# Patient Record
Sex: Female | Born: 1950
Health system: Southern US, Community
[De-identification: ages and names within clinical notes are randomized; demographics above are authoritative.]

## PROBLEM LIST (undated history)

## (undated) DIAGNOSIS — E119 Type 2 diabetes mellitus without complications: Secondary | ICD-10-CM

## (undated) DIAGNOSIS — J45909 Unspecified asthma, uncomplicated: Secondary | ICD-10-CM

## (undated) DIAGNOSIS — K219 Gastro-esophageal reflux disease without esophagitis: Secondary | ICD-10-CM

## (undated) DIAGNOSIS — C4491 Basal cell carcinoma of skin, unspecified: Secondary | ICD-10-CM

## (undated) DIAGNOSIS — F419 Anxiety disorder, unspecified: Secondary | ICD-10-CM

## (undated) DIAGNOSIS — G4730 Sleep apnea, unspecified: Secondary | ICD-10-CM

## (undated) DIAGNOSIS — Z87442 Personal history of urinary calculi: Secondary | ICD-10-CM

## (undated) DIAGNOSIS — Z9889 Other specified postprocedural states: Secondary | ICD-10-CM

## (undated) DIAGNOSIS — R011 Cardiac murmur, unspecified: Secondary | ICD-10-CM

## (undated) DIAGNOSIS — R7303 Prediabetes: Secondary | ICD-10-CM

## (undated) DIAGNOSIS — M1990 Unspecified osteoarthritis, unspecified site: Secondary | ICD-10-CM

## (undated) DIAGNOSIS — I10 Essential (primary) hypertension: Secondary | ICD-10-CM

## (undated) DIAGNOSIS — I639 Cerebral infarction, unspecified: Secondary | ICD-10-CM

## (undated) DIAGNOSIS — R112 Nausea with vomiting, unspecified: Secondary | ICD-10-CM

## (undated) HISTORY — PX: DIAGNOSTIC LAPAROSCOPY: SUR761

## (undated) HISTORY — PX: CHOLECYSTECTOMY: SHX55

## (undated) HISTORY — PX: BREAST SURGERY: SHX581

## (undated) HISTORY — DX: Essential (primary) hypertension: I10

## (undated) HISTORY — DX: Type 2 diabetes mellitus without complications: E11.9

## (undated) HISTORY — DX: Cerebral infarction, unspecified: I63.9

## (undated) HISTORY — PX: ABDOMINAL HYSTERECTOMY: SHX81

## (undated) HISTORY — PX: OTHER SURGICAL HISTORY: SHX169

## (undated) HISTORY — DX: Unspecified asthma, uncomplicated: J45.909

## (undated) HISTORY — DX: Anxiety disorder, unspecified: F41.9

## (undated) HISTORY — PX: BREAST BIOPSY: SHX20

## (undated) HISTORY — PX: APPENDECTOMY: SHX54

## (undated) HISTORY — PX: BREAST EXCISIONAL BIOPSY: SUR124

---

## 1999-01-01 ENCOUNTER — Encounter: Payer: Self-pay | Admitting: Family Medicine

## 1999-01-01 ENCOUNTER — Encounter: Admission: RE | Admit: 1999-01-01 | Discharge: 1999-01-01 | Payer: Self-pay | Admitting: Family Medicine

## 1999-12-01 ENCOUNTER — Encounter: Admission: RE | Admit: 1999-12-01 | Discharge: 1999-12-01 | Payer: Self-pay | Admitting: Family Medicine

## 1999-12-01 ENCOUNTER — Encounter: Payer: Self-pay | Admitting: Family Medicine

## 1999-12-15 ENCOUNTER — Encounter: Admission: RE | Admit: 1999-12-15 | Discharge: 2000-03-14 | Payer: Self-pay | Admitting: Family Medicine

## 2000-01-07 ENCOUNTER — Encounter: Admission: RE | Admit: 2000-01-07 | Discharge: 2000-01-07 | Payer: Self-pay | Admitting: Obstetrics and Gynecology

## 2000-01-07 ENCOUNTER — Encounter: Payer: Self-pay | Admitting: Obstetrics and Gynecology

## 2000-05-24 ENCOUNTER — Encounter: Payer: Self-pay | Admitting: Family Medicine

## 2000-05-24 ENCOUNTER — Encounter: Admission: RE | Admit: 2000-05-24 | Discharge: 2000-05-24 | Payer: Self-pay | Admitting: Family Medicine

## 2001-05-16 ENCOUNTER — Encounter: Payer: Self-pay | Admitting: Emergency Medicine

## 2001-05-16 ENCOUNTER — Emergency Department (HOSPITAL_COMMUNITY): Admission: EM | Admit: 2001-05-16 | Discharge: 2001-05-16 | Payer: Self-pay | Admitting: Emergency Medicine

## 2001-08-14 ENCOUNTER — Encounter: Admission: RE | Admit: 2001-08-14 | Discharge: 2001-08-14 | Payer: Self-pay | Admitting: Family Medicine

## 2001-08-14 ENCOUNTER — Encounter: Payer: Self-pay | Admitting: Family Medicine

## 2001-10-29 ENCOUNTER — Ambulatory Visit (HOSPITAL_BASED_OUTPATIENT_CLINIC_OR_DEPARTMENT_OTHER): Admission: RE | Admit: 2001-10-29 | Discharge: 2001-10-29 | Payer: Self-pay | Admitting: Urology

## 2001-10-29 ENCOUNTER — Encounter: Payer: Self-pay | Admitting: Urology

## 2001-11-01 ENCOUNTER — Encounter: Payer: Self-pay | Admitting: Urology

## 2001-11-01 ENCOUNTER — Ambulatory Visit (HOSPITAL_BASED_OUTPATIENT_CLINIC_OR_DEPARTMENT_OTHER): Admission: RE | Admit: 2001-11-01 | Discharge: 2001-11-01 | Payer: Self-pay | Admitting: Urology

## 2001-12-11 ENCOUNTER — Other Ambulatory Visit: Admission: RE | Admit: 2001-12-11 | Discharge: 2001-12-11 | Payer: Self-pay | Admitting: Obstetrics and Gynecology

## 2002-09-23 ENCOUNTER — Encounter: Admission: RE | Admit: 2002-09-23 | Discharge: 2002-09-23 | Payer: Self-pay | Admitting: Family Medicine

## 2002-09-23 ENCOUNTER — Encounter: Payer: Self-pay | Admitting: Family Medicine

## 2004-01-21 ENCOUNTER — Ambulatory Visit (HOSPITAL_COMMUNITY): Admission: RE | Admit: 2004-01-21 | Discharge: 2004-01-21 | Payer: Self-pay | Admitting: Family Medicine

## 2004-05-25 ENCOUNTER — Encounter: Admission: RE | Admit: 2004-05-25 | Discharge: 2004-05-25 | Payer: Self-pay | Admitting: *Deleted

## 2005-02-04 ENCOUNTER — Emergency Department (HOSPITAL_COMMUNITY): Admission: EM | Admit: 2005-02-04 | Discharge: 2005-02-04 | Payer: Self-pay | Admitting: Emergency Medicine

## 2005-06-14 ENCOUNTER — Encounter: Admission: RE | Admit: 2005-06-14 | Discharge: 2005-06-14 | Payer: Self-pay | Admitting: Obstetrics and Gynecology

## 2005-09-19 ENCOUNTER — Ambulatory Visit (HOSPITAL_COMMUNITY): Admission: RE | Admit: 2005-09-19 | Discharge: 2005-09-19 | Payer: Self-pay | Admitting: Urology

## 2006-06-19 ENCOUNTER — Encounter: Admission: RE | Admit: 2006-06-19 | Discharge: 2006-06-19 | Payer: Self-pay | Admitting: Family Medicine

## 2006-08-02 ENCOUNTER — Encounter: Admission: RE | Admit: 2006-08-02 | Discharge: 2006-08-02 | Payer: Self-pay | Admitting: Obstetrics and Gynecology

## 2007-03-05 ENCOUNTER — Ambulatory Visit (HOSPITAL_COMMUNITY): Admission: RE | Admit: 2007-03-05 | Discharge: 2007-03-05 | Payer: Self-pay | Admitting: Urology

## 2007-08-07 ENCOUNTER — Encounter: Admission: RE | Admit: 2007-08-07 | Discharge: 2007-08-07 | Payer: Self-pay | Admitting: Obstetrics and Gynecology

## 2007-08-13 ENCOUNTER — Encounter: Admission: RE | Admit: 2007-08-13 | Discharge: 2007-08-13 | Payer: Self-pay | Admitting: Obstetrics and Gynecology

## 2008-07-18 ENCOUNTER — Encounter: Admission: RE | Admit: 2008-07-18 | Discharge: 2008-07-18 | Payer: Self-pay | Admitting: Family Medicine

## 2008-08-14 ENCOUNTER — Encounter: Admission: RE | Admit: 2008-08-14 | Discharge: 2008-08-14 | Payer: Self-pay | Admitting: Family Medicine

## 2008-12-09 ENCOUNTER — Encounter: Admission: RE | Admit: 2008-12-09 | Discharge: 2008-12-09 | Payer: Self-pay | Admitting: Obstetrics and Gynecology

## 2008-12-14 ENCOUNTER — Encounter: Admission: RE | Admit: 2008-12-14 | Discharge: 2008-12-14 | Payer: Self-pay | Admitting: Orthopedic Surgery

## 2009-02-09 ENCOUNTER — Ambulatory Visit (HOSPITAL_COMMUNITY): Admission: RE | Admit: 2009-02-09 | Discharge: 2009-02-09 | Payer: Self-pay | Admitting: Urology

## 2009-08-17 ENCOUNTER — Encounter: Admission: RE | Admit: 2009-08-17 | Discharge: 2009-08-17 | Payer: Self-pay | Admitting: Family Medicine

## 2010-07-30 NOTE — Op Note (Signed)
Christine Navarro, Christine Navarro NO.:  [id number]   MEDICAL RECORD NO.:  [id number]                    PATIENT TYPE:   LOCATION:                                       FACILITY:   PHYSICIAN:  Rozanna Boer., M.D.      DATE OF BIRTH:   DATE OF PROCEDURE:  10/29/2001  DATE OF DISCHARGE:                                 OPERATIVE REPORT   PREOPERATIVE DIAGNOSIS:  Right distal ureteral stone.   POSTOPERATIVE DIAGNOSIS:  Right distal ureteral stone.   OPERATION:  Right ureteroscopic stone extraction with insertion of stent.   ANESTHESIA:  General.   SURGEON:  Courtney Paris, M.D.   BRIEF HISTORY:  This 60 year old white female presented with about a month's  history of pain off and on of the right flank.  A KUB showed a large 10 mm  stone in the right kidney and what looked to be a phlebolith in the distal  ureter but when her subsequent films came back from 1973, it was clear that  this was not a phlebolith but rather a distal stone.  So she was rescheduled  from lithotripsy to ureteroscopy with stone extraction.  The patient enters  understanding the risks of the procedure, including, but not limited to  ureteral perforation, injury, bleeding, pain and spasms with stent, etc.   DESCRIPTION OF PROCEDURE:  The patient was placed on the operating table in  dorsal lithotomy position.  After satisfactory induction of general  endotracheal anesthesia, was prepped and draped with Betadine in usual  sterile fashion.  She was given IV antibiotics.  The #21 panendoscope was  inserted, and the right orifice was somewhat tight and had to be  catheterized with a floppy tip guidewire through the middle of an open-ended  ureteral stent.  This allowed the catheter to be inserted into the ureter,  and a retrograde demonstrated this was indeed a stone in the ureter with  some mild obstruction.  The guidewire was then passed under fluoroscopy past  the stone,  and the distal ureter was balloon dilated with 4 cm balloon up to  12 atmospheres, kept maintained for five times minutes.  The #6 short  ureteroscope was then passed, but apparently the stone moved proximally up  to the kidney.  I was able to entrap it with a basket and removed it intact.  Over the stent, then a 24 cm 6 French stent was then passed under  fluoroscopy with good position, and the bladder was drained.  She given a  B&O suppository and taken to the recovery room in good condition and will be  later scheduled for lithotripsy for the large right renal stone now that the  distal stone has been removed.  Rozanna Boer., M.D.    HMK/MEDQ  D:  10/29/2001  T:  10/29/2001  Job:  [id number]

## 2010-09-22 ENCOUNTER — Other Ambulatory Visit: Payer: Self-pay | Admitting: Family Medicine

## 2010-09-22 DIAGNOSIS — Z1231 Encounter for screening mammogram for malignant neoplasm of breast: Secondary | ICD-10-CM

## 2010-09-27 ENCOUNTER — Other Ambulatory Visit: Payer: Self-pay | Admitting: Family Medicine

## 2010-09-27 ENCOUNTER — Ambulatory Visit
Admission: RE | Admit: 2010-09-27 | Discharge: 2010-09-27 | Disposition: A | Discharge status: Home / Self Care | Payer: BC Managed Care – PPO | Source: Ambulatory Visit | Attending: Family Medicine | Admitting: Family Medicine

## 2010-09-27 DIAGNOSIS — Z1231 Encounter for screening mammogram for malignant neoplasm of breast: Secondary | ICD-10-CM

## 2010-09-27 DIAGNOSIS — N63 Unspecified lump in unspecified breast: Secondary | ICD-10-CM

## 2010-10-05 ENCOUNTER — Ambulatory Visit
Admission: RE | Admit: 2010-10-05 | Discharge: 2010-10-05 | Disposition: A | Discharge status: Home / Self Care | Payer: BC Managed Care – PPO | Source: Ambulatory Visit | Attending: Family Medicine | Admitting: Family Medicine

## 2010-10-05 ENCOUNTER — Other Ambulatory Visit: Payer: Self-pay | Admitting: Family Medicine

## 2010-10-05 DIAGNOSIS — N63 Unspecified lump in unspecified breast: Secondary | ICD-10-CM

## 2010-12-17 LAB — URINALYSIS, ROUTINE W REFLEX MICROSCOPIC
Bilirubin Urine: NEGATIVE
Hgb urine dipstick: NEGATIVE
Ketones, ur: NEGATIVE
Nitrite: NEGATIVE
Protein, ur: NEGATIVE
Specific Gravity, Urine: 1.022
pH: 5.5

## 2010-12-17 LAB — BASIC METABOLIC PANEL
BUN: 7
Calcium: 9.4
GFR calc Af Amer: >60
GFR calc non Af Amer: >60
Potassium: 4.5
Sodium: 140

## 2010-12-17 LAB — CBC
MCHC: 34.3
MCV: 85.3
Platelets: 243
RBC: 4.53

## 2011-10-26 ENCOUNTER — Other Ambulatory Visit: Payer: Self-pay | Admitting: Family Medicine

## 2011-10-26 DIAGNOSIS — Z1231 Encounter for screening mammogram for malignant neoplasm of breast: Secondary | ICD-10-CM

## 2011-11-17 ENCOUNTER — Ambulatory Visit
Admission: RE | Admit: 2011-11-17 | Discharge: 2011-11-17 | Disposition: A | Discharge status: Home / Self Care | Payer: BC Managed Care – PPO | Source: Ambulatory Visit | Attending: Family Medicine | Admitting: Family Medicine

## 2011-11-17 DIAGNOSIS — Z1231 Encounter for screening mammogram for malignant neoplasm of breast: Secondary | ICD-10-CM

## 2011-11-23 ENCOUNTER — Other Ambulatory Visit: Payer: Self-pay | Admitting: Family Medicine

## 2011-11-23 DIAGNOSIS — R928 Other abnormal and inconclusive findings on diagnostic imaging of breast: Secondary | ICD-10-CM

## 2011-11-24 ENCOUNTER — Ambulatory Visit
Admission: RE | Admit: 2011-11-24 | Discharge: 2011-11-24 | Disposition: A | Discharge status: Home / Self Care | Payer: BC Managed Care – PPO | Source: Ambulatory Visit | Attending: Family Medicine | Admitting: Family Medicine

## 2011-11-24 DIAGNOSIS — R928 Other abnormal and inconclusive findings on diagnostic imaging of breast: Secondary | ICD-10-CM

## 2012-05-01 ENCOUNTER — Ambulatory Visit: Payer: BC Managed Care – PPO

## 2012-05-01 ENCOUNTER — Ambulatory Visit: Payer: BC Managed Care – PPO | Admitting: Family Medicine

## 2012-05-01 VITALS — BP 132/82 | HR 87 | Temp 97.9°F | Resp 18 | Ht 62.0 in | Wt 195.0 lb

## 2012-05-01 DIAGNOSIS — R1032 Left lower quadrant pain: Secondary | ICD-10-CM

## 2012-05-01 LAB — POCT UA - MICROSCOPIC ONLY
Casts, Ur, LPF, POC: NEGATIVE
Crystals, Ur, HPF, POC: NEGATIVE
Yeast, UA: NEGATIVE

## 2012-05-01 LAB — POCT URINALYSIS DIPSTICK: pH, UA: 5.5

## 2012-05-01 MED ORDER — CEPHALEXIN 500 MG PO CAPS: 500.0000 mg | ORAL_CAPSULE | Freq: Two times a day (BID) | ORAL | Status: DC

## 2012-05-01 NOTE — Patient Instructions (Addendum)
Thank you for coming in today. Start taking miralax daily.  Start Keflex as well.  Finish both antibiotics.  Follow up with your primary doctor.  If the symptoms persist it may be reasonable to followup with your gastroenterologist for a repeat colonoscopy. If you get worse or develop significant fever or go to the emergency room

## 2012-05-01 NOTE — Progress Notes (Addendum)
Christine Navarro is a 62 y.o. female who presents to [facility] today for left lower corner and abdominal pain starting 5 days ago. She thought this perhaps was a urinary tract infection and presented to [facility] urgent care for evaluation and management. She was started on nitrofurantoin. She notes pain continued off and on since then. She denies any change in stool blood in her stool or black tarry stool. She denies any nausea vomiting diarrhea fevers or chills. She denies any blood in her urine. She states that she does have a history of kidney stones but this is not consistent with that pain. Additionally she notes a normal colonoscopy 3 or 4 years ago.     PMH: Reviewed history of hysterectomy with salpingo-oophorectomy (for fibroids and subsequent oophorectomy for teratoma). History of laparoscopic cholecystectomy and laparoscopic appendectomy. History  Substance Use Topics  . Smoking status: Never Smoker   . Smokeless tobacco: Not on file  . Alcohol Use: No   ROS as above  Medications reviewed. Current Outpatient Prescriptions  Medication Sig Dispense Refill  . estrogens, conjugated, (PREMARIN) 0.625 MG tablet Take 0.625 mg by mouth daily. Take daily for 21 days then do not take for 7 days.      . irbesartan (AVAPRO) 150 MG tablet Take 75 mg by mouth at bedtime.      Marland Kitchen LORazepam (ATIVAN) 1 MG tablet Take 1 mg by mouth daily.      . metoprolol tartrate (LOPRESSOR) 25 MG tablet Take 25 mg by mouth 2 (two) times daily.      . nitrofurantoin (MACRODANTIN) 100 MG capsule Take 100 mg by mouth.      . rosuvastatin (CRESTOR) 40 MG tablet Take 20 mg by mouth daily.      Marland Kitchen tolterodine (DETROL LA) 4 MG 24 hr capsule Take 4 mg by mouth daily.      . cephALEXin (KEFLEX) 500 MG capsule Take 1 capsule (500 mg total) by mouth 2 (two) times daily.  14 capsule  0   No current facility-administered medications for this visit.    Exam:  BP 132/82  Pulse 87  Temp(Src) 97.9 F (36.6 C)  Resp 18  Ht 5\' 2"   (1.575 m)  Wt 195 lb (88.451 kg)  BMI 35.66 kg/m2  SpO2 99% Gen: Well NAD HEENT: EOMI,  MMM Lungs: CTABL Nl WOB Heart: RRR no MRG Abd: NABS, NT, ND, no rebound or guarding no costovertebral angle tenderness to percussion Exts: Non edematous BL  LE, warm and well perfused.   Results for orders placed in visit on 05/01/12 (from the past 72 hour(s))  POCT URINALYSIS DIPSTICK     Status: None   Collection Time    05/01/12  8:56 PM      Result Value Range   Color, UA amber     Clarity, UA clear     Glucose, UA neg     Bilirubin, UA neg     Ketones, UA trace     Spec Grav, UA >=1.030     Blood, UA neg     pH, UA 5.5     Protein, UA trace     Urobilinogen, UA 0.2     Nitrite, UA neg     Leukocytes, UA Trace    POCT UA - MICROSCOPIC ONLY     Status: None   Collection Time    05/01/12  9:10 PM      Result Value Range   WBC, Ur, HPF, POC 0-6  RBC, urine, microscopic 0-2     Bacteria, U Microscopic 1+     Mucus, UA trace     Epithelial cells, urine per micros 0-1     Crystals, Ur, HPF, POC neg     Casts, Ur, LPF, POC neg     Yeast, UA neg     Waxy Casts, UA pos     Upright one view abdominal x-ray shows moderate stool burden. Surgical clips present in the upper right quadrant  Assessment and Plan: 62 y.o. female with mild to moderate left lower quadrant abdominal pain. Unsure etiology. Possibly poorly treated urinary tract infection, versus kidney stone versus stool burden. Plan: Increase MiraLAX daily, add Keflex.  Followup with primary care provider. If not improving consider further imaging and possible repeat colonoscopy.  05/02/12 Reviewed and agree with above plan. Tle

## 2012-12-05 ENCOUNTER — Other Ambulatory Visit: Payer: Self-pay

## 2012-12-05 DIAGNOSIS — Z1231 Encounter for screening mammogram for malignant neoplasm of breast: Secondary | ICD-10-CM

## 2012-12-21 ENCOUNTER — Ambulatory Visit: Payer: BC Managed Care – PPO

## 2012-12-24 ENCOUNTER — Ambulatory Visit
Admission: RE | Admit: 2012-12-24 | Discharge: 2012-12-24 | Disposition: A | Discharge status: Home / Self Care | Payer: BC Managed Care – PPO | Source: Ambulatory Visit

## 2012-12-24 DIAGNOSIS — Z1231 Encounter for screening mammogram for malignant neoplasm of breast: Secondary | ICD-10-CM

## 2012-12-26 ENCOUNTER — Other Ambulatory Visit: Payer: Self-pay | Admitting: Family Medicine

## 2012-12-26 DIAGNOSIS — N63 Unspecified lump in unspecified breast: Secondary | ICD-10-CM

## 2013-01-11 ENCOUNTER — Other Ambulatory Visit: Payer: BC Managed Care – PPO

## 2013-06-12 ENCOUNTER — Other Ambulatory Visit: Payer: BC Managed Care – PPO

## 2013-06-19 ENCOUNTER — Ambulatory Visit
Admission: RE | Admit: 2013-06-19 | Discharge: 2013-06-19 | Disposition: A | Discharge status: Home / Self Care | Payer: BC Managed Care – PPO | Source: Ambulatory Visit | Attending: Family Medicine | Admitting: Family Medicine

## 2013-06-19 ENCOUNTER — Ambulatory Visit
Admission: RE | Admit: 2013-06-19 | Discharge: 2013-06-19 | Disposition: A | Discharge status: Home / Self Care | Payer: Self-pay | Source: Ambulatory Visit | Attending: Family Medicine | Admitting: Family Medicine

## 2013-06-19 DIAGNOSIS — N63 Unspecified lump in unspecified breast: Secondary | ICD-10-CM

## 2014-06-19 DIAGNOSIS — E119 Type 2 diabetes mellitus without complications: Secondary | ICD-10-CM | POA: Diagnosis not present

## 2014-06-19 DIAGNOSIS — E780 Pure hypercholesterolemia: Secondary | ICD-10-CM | POA: Diagnosis not present

## 2014-06-19 DIAGNOSIS — I10 Essential (primary) hypertension: Secondary | ICD-10-CM | POA: Diagnosis not present

## 2014-09-08 ENCOUNTER — Other Ambulatory Visit: Payer: Self-pay

## 2014-09-08 DIAGNOSIS — Z1231 Encounter for screening mammogram for malignant neoplasm of breast: Secondary | ICD-10-CM

## 2014-09-12 ENCOUNTER — Ambulatory Visit
Admission: RE | Admit: 2014-09-12 | Discharge: 2014-09-12 | Disposition: A | Discharge status: Home / Self Care | Payer: Medicare Other | Source: Ambulatory Visit

## 2014-09-12 DIAGNOSIS — Z1231 Encounter for screening mammogram for malignant neoplasm of breast: Secondary | ICD-10-CM | POA: Diagnosis not present

## 2014-09-16 ENCOUNTER — Other Ambulatory Visit: Payer: Self-pay | Admitting: Family Medicine

## 2014-09-16 DIAGNOSIS — R928 Other abnormal and inconclusive findings on diagnostic imaging of breast: Secondary | ICD-10-CM

## 2014-09-19 ENCOUNTER — Ambulatory Visit
Admission: RE | Admit: 2014-09-19 | Discharge: 2014-09-19 | Disposition: A | Discharge status: Home / Self Care | Payer: Medicare Other | Source: Ambulatory Visit | Attending: Family Medicine | Admitting: Family Medicine

## 2014-09-19 DIAGNOSIS — R928 Other abnormal and inconclusive findings on diagnostic imaging of breast: Secondary | ICD-10-CM

## 2014-09-19 DIAGNOSIS — N6012 Diffuse cystic mastopathy of left breast: Secondary | ICD-10-CM | POA: Diagnosis not present

## 2014-09-22 DIAGNOSIS — E119 Type 2 diabetes mellitus without complications: Secondary | ICD-10-CM | POA: Diagnosis not present

## 2014-09-26 DIAGNOSIS — H40013 Open angle with borderline findings, low risk, bilateral: Secondary | ICD-10-CM | POA: Diagnosis not present

## 2014-11-21 ENCOUNTER — Other Ambulatory Visit: Payer: Self-pay | Admitting: Urology

## 2014-11-21 ENCOUNTER — Ambulatory Visit (INDEPENDENT_AMBULATORY_CARE_PROVIDER_SITE_OTHER): Payer: Medicare Other | Admitting: Family Medicine

## 2014-11-21 VITALS — BP 150/90 | HR 77 | Temp 98.1°F | Resp 18 | Ht 62.0 in | Wt 205.2 lb

## 2014-11-21 DIAGNOSIS — R109 Unspecified abdominal pain: Secondary | ICD-10-CM

## 2014-11-21 DIAGNOSIS — Z87442 Personal history of urinary calculi: Secondary | ICD-10-CM | POA: Diagnosis not present

## 2014-11-21 DIAGNOSIS — N132 Hydronephrosis with renal and ureteral calculous obstruction: Secondary | ICD-10-CM | POA: Diagnosis not present

## 2014-11-21 DIAGNOSIS — N200 Calculus of kidney: Secondary | ICD-10-CM | POA: Diagnosis not present

## 2014-11-21 DIAGNOSIS — R34 Anuria and oliguria: Secondary | ICD-10-CM | POA: Diagnosis not present

## 2014-11-21 LAB — POCT UA - MICROSCOPIC ONLY
CASTS, UR, LPF, POC: NEGATIVE
CRYSTALS, UR, HPF, POC: NEGATIVE
Epithelial cells, urine per micros: NEGATIVE
MUCUS UA: NEGATIVE
Yeast, UA: NEGATIVE

## 2014-11-21 LAB — POCT URINALYSIS DIPSTICK
BILIRUBIN UA: NEGATIVE
GLUCOSE UA: NEGATIVE
Ketones, UA: NEGATIVE
Leukocytes, UA: NEGATIVE
NITRITE UA: NEGATIVE
Protein, UA: NEGATIVE
SPEC GRAV UA: 1.02
Urobilinogen, UA: 0.2
pH, UA: 6

## 2014-11-21 MED ORDER — KETOROLAC TROMETHAMINE 30 MG/ML IJ SOLN
30.0000 mg | Freq: Once | INTRAMUSCULAR | Status: AC
  Administered 2014-11-21: 30 mg via INTRAMUSCULAR

## 2014-11-21 NOTE — Progress Notes (Addendum)
[facility] [address] [place] [address] [phone]  Date:  11/21/2014   Name:  Christine Navarro   DOB:  09/28/1950   MRN:  [id number]  PCP:  No primary care provider on file.    Chief Complaint: Nephrolithiasis   History of Present Illness:  Christine Navarro is a 64 y.o. very pleasant female patient who presents with the following:  She awoke this am around 615 with left flank pain.  She has a known history of kidney stones- this seems like a stone to her Her urologist has retired, but she will re-establish with a new MD at [facility] soon- in fact she has an appt this afternoon  She did not see any blood in her urine, but it did appear dark and cloudy She was vomited with pain today  She took tylenol only so far today  Patient Active Problem List   Diagnosis Date Noted  . Abdominal pain, left lower quadrant 05/01/2012    Past Medical History  Diagnosis Date  . Asthma   . Diabetes mellitus without complication   . Anxiety   . Hypertension     Past Surgical History  Procedure Laterality Date  . Appendectomy    . Breast surgery    . Cholecystectomy    . Abdominal hysterectomy      Social History  Substance Use Topics  . Smoking status: Never Smoker   . Smokeless tobacco: None  . Alcohol Use: No    Family History  Problem Relation Age of Onset  . Cancer Mother   . Heart disease Father     Allergies  Allergen Reactions  . Ace Inhibitors Cough  . Ciprofloxacin Other (See Comments)    Muscle Cramps  . Sular [Nisoldipine Er]   . Vitamin D Analogs Nausea And Vomiting  . Welchol [Colesevelam Hcl] Nausea And Vomiting  . Erythromycin Nausea And Vomiting  . Sulfa Antibiotics Rash    Medication list has been reviewed and updated.  Current Outpatient Prescriptions on File Prior to Visit  Medication Sig Dispense Refill  . estrogens, conjugated, (PREMARIN) 0.625 MG tablet Take 0.625 mg by mouth daily. Take daily for 21 days then do  not take for 7 days.    . irbesartan (AVAPRO) 150 MG tablet Take 75 mg by mouth at bedtime.    Marland Kitchen LORazepam (ATIVAN) 1 MG tablet Take 1 mg by mouth daily.    . metoprolol tartrate (LOPRESSOR) 25 MG tablet Take 25 mg by mouth 2 (two) times daily.    . nitrofurantoin (MACRODANTIN) 100 MG capsule Take 100 mg by mouth.    . rosuvastatin (CRESTOR) 40 MG tablet Take 20 mg by mouth daily.    Marland Kitchen tolterodine (DETROL LA) 4 MG 24 hr capsule Take 4 mg by mouth daily.     No current facility-administered medications on file prior to visit.    Review of Systems:  As per HPI- otherwise negative.   Physical Examination: Filed Vitals:   11/21/14 1037  BP: 162/92  Pulse: 77  Temp: 98.1 F (36.7 C)  Resp: 18   Filed Vitals:   11/21/14 1037  Height: 5\' 2"  (1.575 m)  Weight: 205 lb 3.2 oz (93.078 kg)   Body mass index is 37.52 kg/(m^2). Ideal Body Weight: Weight in (lb) to have BMI = 25: 136.4  GEN: WDWN, NAD, Non-toxic, A & O x 3, obese, looks well HEENT: Atraumatic, Normocephalic. Neck supple. No masses, No LAD. Ears and  Nose: No external deformity. CV: RRR, No M/G/R. No JVD. No thrill. No extra heart sounds. PULM: CTA B, no wheezes, crackles, rhonchi. No retractions. No resp. distress. No accessory muscle use. ABD: S, NT, ND. No rebound. No HSM. EXTR: No c/c/e NEURO Normal gait.  PSYCH: Normally interactive. Conversant. Not depressed or anxious appearing.  Calm demeanor.  Left CVA tenderness  Given toradol 30 mg IM once for pain- this helped hear lot Results for orders placed or performed in visit on 11/21/14  POCT UA - Microscopic Only  Result Value Ref Range   WBC, Ur, HPF, POC 0-2    RBC, urine, microscopic 7-9    Bacteria, U Microscopic trace    Mucus, UA neg    Epithelial cells, urine per micros neg    Crystals, Ur, HPF, POC neg    Casts, Ur, LPF, POC neg    Yeast, UA neg   POCT urinalysis dipstick  Result Value Ref Range   Color, UA amber    Clarity, UA turbid     Glucose, UA neg    Bilirubin, UA neg    Ketones, UA neg    Spec Grav, UA 1.020    Blood, UA mod    pH, UA 6.0    Protein, UA neg    Urobilinogen, UA 0.2    Nitrite, UA neg    Leukocytes, UA Negative Negative    Assessment and Plan: Right flank pain - Plan: ketorolac (TORADOL) 30 MG/ML injection 30 mg, POCT UA - Microscopic Only, POCT urinalysis dipstick, Urine culture  History of nephrolithiasis  Likely nephrolithiasis.  Treated with toradol for pain with good results.  She will see her urologist in about an hour for further eval  Signed Lamar Blinks, MD Called her on 9/13- she confirms that she did have a stone. Will have lithotripsy next week.  She is doing better as far as pain Mixed bacteria on ucx is likely contamination

## 2014-11-21 NOTE — Patient Instructions (Addendum)
Your urine today does suggest a kidney stone-  Please see your urologist today as planned- they may wish to do an X-ray or CT You received 30 mg of Toradol for pain today Let us know if you need anything further I sent your urine for a culture and will let you know if you grow any bacteria to indicate a UTI

## 2014-11-22 LAB — URINE CULTURE: Colony Count: >=100000

## 2014-11-23 ENCOUNTER — Encounter: Payer: Self-pay | Admitting: Family Medicine

## 2014-11-25 ENCOUNTER — Encounter (HOSPITAL_COMMUNITY): Payer: Self-pay | Admitting: *Deleted

## 2014-11-26 ENCOUNTER — Other Ambulatory Visit: Payer: Self-pay

## 2014-11-26 ENCOUNTER — Encounter (HOSPITAL_COMMUNITY)
Admission: RE | Admit: 2014-11-26 | Discharge: 2014-11-26 | Disposition: A | Discharge status: Home / Self Care | Payer: Medicare Other | Source: Ambulatory Visit | Attending: Urology | Admitting: Urology

## 2014-11-26 DIAGNOSIS — I10 Essential (primary) hypertension: Secondary | ICD-10-CM | POA: Diagnosis not present

## 2014-11-26 DIAGNOSIS — E669 Obesity, unspecified: Secondary | ICD-10-CM | POA: Diagnosis not present

## 2014-11-26 DIAGNOSIS — J45909 Unspecified asthma, uncomplicated: Secondary | ICD-10-CM | POA: Diagnosis not present

## 2014-11-26 DIAGNOSIS — N132 Hydronephrosis with renal and ureteral calculous obstruction: Secondary | ICD-10-CM | POA: Diagnosis not present

## 2014-11-26 DIAGNOSIS — R109 Unspecified abdominal pain: Secondary | ICD-10-CM | POA: Diagnosis present

## 2014-11-26 DIAGNOSIS — I499 Cardiac arrhythmia, unspecified: Secondary | ICD-10-CM | POA: Diagnosis not present

## 2014-11-26 DIAGNOSIS — E119 Type 2 diabetes mellitus without complications: Secondary | ICD-10-CM | POA: Diagnosis not present

## 2014-11-26 DIAGNOSIS — G4730 Sleep apnea, unspecified: Secondary | ICD-10-CM | POA: Diagnosis not present

## 2014-11-27 ENCOUNTER — Ambulatory Visit (HOSPITAL_COMMUNITY): Payer: Medicare Other

## 2014-11-27 ENCOUNTER — Ambulatory Visit (HOSPITAL_COMMUNITY)
Admission: RE | Admit: 2014-11-27 | Discharge: 2014-11-27 | Disposition: A | Discharge status: Home / Self Care | Payer: Medicare Other | Source: Ambulatory Visit | Attending: Urology | Admitting: Urology

## 2014-11-27 ENCOUNTER — Encounter (HOSPITAL_COMMUNITY)
Admission: RE | Disposition: A | Discharge status: Home / Self Care | Payer: Self-pay | Source: Ambulatory Visit | Attending: Urology

## 2014-11-27 ENCOUNTER — Encounter (HOSPITAL_COMMUNITY): Payer: Self-pay

## 2014-11-27 DIAGNOSIS — E119 Type 2 diabetes mellitus without complications: Secondary | ICD-10-CM | POA: Insufficient documentation

## 2014-11-27 DIAGNOSIS — N132 Hydronephrosis with renal and ureteral calculous obstruction: Secondary | ICD-10-CM | POA: Diagnosis not present

## 2014-11-27 DIAGNOSIS — J45909 Unspecified asthma, uncomplicated: Secondary | ICD-10-CM | POA: Insufficient documentation

## 2014-11-27 DIAGNOSIS — I10 Essential (primary) hypertension: Secondary | ICD-10-CM | POA: Insufficient documentation

## 2014-11-27 DIAGNOSIS — I499 Cardiac arrhythmia, unspecified: Secondary | ICD-10-CM | POA: Insufficient documentation

## 2014-11-27 DIAGNOSIS — G4730 Sleep apnea, unspecified: Secondary | ICD-10-CM | POA: Diagnosis not present

## 2014-11-27 DIAGNOSIS — N201 Calculus of ureter: Secondary | ICD-10-CM

## 2014-11-27 DIAGNOSIS — E669 Obesity, unspecified: Secondary | ICD-10-CM | POA: Insufficient documentation

## 2014-11-27 DIAGNOSIS — N200 Calculus of kidney: Secondary | ICD-10-CM | POA: Diagnosis not present

## 2014-11-27 HISTORY — DX: Other specified postprocedural states: Z98.890

## 2014-11-27 HISTORY — DX: Nausea with vomiting, unspecified: R11.2

## 2014-11-27 SURGERY — LITHOTRIPSY, ESWL
Anesthesia: LOCAL | Laterality: Left

## 2014-11-27 MED ORDER — SODIUM CHLORIDE 0.9 % IV SOLN
INTRAVENOUS | Status: DC
  Administered 2014-11-27: 11:00:00 via INTRAVENOUS

## 2014-11-27 MED ORDER — DIPHENHYDRAMINE HCL 25 MG PO CAPS
25.0000 mg | ORAL_CAPSULE | ORAL | Status: AC
  Administered 2014-11-27: 25 mg via ORAL
  Filled 2014-11-27: qty 1

## 2014-11-27 MED ORDER — DIAZEPAM 5 MG PO TABS
10.0000 mg | ORAL_TABLET | ORAL | Status: AC
  Administered 2014-11-27: 10 mg via ORAL
  Filled 2014-11-27: qty 2

## 2014-11-27 MED ORDER — CEFAZOLIN SODIUM-DEXTROSE 2-3 GM-% IV SOLR
2.0000 g | INTRAVENOUS | Status: AC
  Administered 2014-11-27: 2 g via INTRAVENOUS
  Filled 2014-11-27: qty 50

## 2014-11-27 NOTE — Discharge Instructions (Signed)
1 - You may have urinary urgency (bladder spasms), bloody urine, and pass tiny stone fragments on / off for up to several weeks. This is normal.  2 - Call MD or go to ER for fever >102, severe pain / nausea / vomiting not relieved by medications, or acute change in medical status

## 2014-11-27 NOTE — H&P (Signed)
Christine Navarro is an 64 y.o. female.    Chief Complaint: Pre-OP Left Shockwave Lithotripsy  HPI:   1 - Left Flank Pain / Proximal Ureteral Stone - 1 day fo acute colickly left flank pain w/o fevers. Some nausea with dry heaves. Had shot of toradol at urgent care and feelign better at present, "feels like Prior stones" CT 11/2014 corroborated 38mm let prox stone.   2 - Nephrolithiasis -  Pre 2016 SWLx several, URS x several 11/2014 34mm left prox ureteral stone, lateral to L2 on CT scout images with mild hydro. Also bilat renal stones, non-obstrucitng including Rt lower pole 1cm.   3 - Metabolic Stone Disease / Oliguria -  Met EVal 2013: CMP,PTH,Urate - normal; Compositoin 90% CaOx / 10% Urate; 24 Hr urines - Low volume (1L) ==> increased hydration and dietary citrate.  PMH sig for DM2, obeswity, benign hyst, open chole. Her PCP is Jeanette Caprice MD.  Today " Christine Navarro " is seen to proceed with left shockwave lithotripsy. NO interval fevers. Most recent UA without infectious parameters.   Past Medical History  Diagnosis Date  . Asthma   . Diabetes mellitus without complication   . Anxiety   . Hypertension   . PONV (postoperative nausea and vomiting)     Past Surgical History  Procedure Laterality Date  . Appendectomy    . Breast surgery    . Cholecystectomy    . Abdominal hysterectomy      Family History  Problem Relation Age of Onset  . Cancer Mother   . Heart disease Father    Social History:  reports that she has never smoked. She does not have any smokeless tobacco history on file. She reports that she does not drink alcohol or use illicit drugs.  Allergies:  Allergies  Allergen Reactions  . Ace Inhibitors Cough  . Ciprofloxacin Other (See Comments)    Muscle Cramps  . Sular [Nisoldipine Er] Other (See Comments)    HEART RACING  . Vitamin D Analogs Nausea And Vomiting  . Welchol [Colesevelam Hcl] Nausea And Vomiting  . Erythromycin Nausea And Vomiting  . Sulfa  Antibiotics Rash    No prescriptions prior to admission    No results found for this or any previous visit (from the past 48 hour(s)). No results found.  Review of Systems  Constitutional: Negative.  Negative for fever and chills.  HENT: Negative.   Eyes: Negative.   Respiratory: Negative.   Cardiovascular: Negative.   Gastrointestinal: Negative.   Genitourinary: Positive for flank pain.  Musculoskeletal: Negative.   Skin: Negative.   Neurological: Negative.   Endo/Heme/Allergies: Negative.   Psychiatric/Behavioral: Negative.     Height $Remov'5\' 2"'NbZMKc$  (1.575 m), weight 92.987 kg (205 lb). Physical Exam  Constitutional: She is oriented to person, place, and time. She appears well-developed.  HENT:  Head: Normocephalic.  Eyes: Pupils are equal, round, and reactive to light.  Neck: Normal range of motion.  Cardiovascular: Normal rate.   Respiratory: Effort normal.  GI: Soft.  Genitourinary:  Mild left CVAT  Musculoskeletal: Normal range of motion.  Neurological: She is alert and oriented to person, place, and time.  Skin: Skin is warm.  Psychiatric: She has a normal mood and affect. Her behavior is normal. Judgment and thought content normal.     Assessment/Plan  1 - Flank Pain - due to left ureteral stone as per below.   2 - Nephrolithiasis -   We rediscussed shockwave lithotripsy in detail as well as  my "rule of 9s" with stones <63m, less than 900 HU, and skin to stone distance <9cm having approximately 90% treatment success with single session of treatment. We then readdressed how stones that are larger, more dense, and in patients with less favorable anatomy have incrementally decreased success rates. We rediscussed risks including, bleeding, infection, hematoma, loss of kidney, need for staged therapy, need for adjunctive therapy and requirement to refrain from any anticoagulants, anti-platelet or aspirin-like products peri-procedureally. After careful consideration, the  patient has chosen to proceed.    3 - Metabolic Stone Disease / Oliguria - continue to rec vigorous hydration as well as citrate intake as best management to decease future risk. continue to rec vigorous hydration as well as citrate intake as best management to decease future risk.    Dierdre Mccalip 11/27/2014, 7:10 AM

## 2014-12-22 ENCOUNTER — Other Ambulatory Visit: Payer: Self-pay | Admitting: Advanced Practice Midwife

## 2014-12-22 DIAGNOSIS — I10 Essential (primary) hypertension: Secondary | ICD-10-CM | POA: Diagnosis not present

## 2014-12-22 DIAGNOSIS — E1165 Type 2 diabetes mellitus with hyperglycemia: Secondary | ICD-10-CM | POA: Diagnosis not present

## 2014-12-22 DIAGNOSIS — Z23 Encounter for immunization: Secondary | ICD-10-CM | POA: Diagnosis not present

## 2014-12-22 DIAGNOSIS — F39 Unspecified mood [affective] disorder: Secondary | ICD-10-CM | POA: Diagnosis not present

## 2014-12-22 DIAGNOSIS — R221 Localized swelling, mass and lump, neck: Secondary | ICD-10-CM | POA: Diagnosis not present

## 2014-12-22 DIAGNOSIS — E559 Vitamin D deficiency, unspecified: Secondary | ICD-10-CM | POA: Diagnosis not present

## 2014-12-22 DIAGNOSIS — J45909 Unspecified asthma, uncomplicated: Secondary | ICD-10-CM | POA: Diagnosis not present

## 2014-12-22 DIAGNOSIS — N951 Menopausal and female climacteric states: Secondary | ICD-10-CM | POA: Diagnosis not present

## 2014-12-22 DIAGNOSIS — E7800 Pure hypercholesterolemia, unspecified: Secondary | ICD-10-CM | POA: Diagnosis not present

## 2014-12-22 DIAGNOSIS — Z0000 Encounter for general adult medical examination without abnormal findings: Secondary | ICD-10-CM | POA: Diagnosis not present

## 2014-12-22 DIAGNOSIS — Z6836 Body mass index (BMI) 36.0-36.9, adult: Secondary | ICD-10-CM | POA: Diagnosis not present

## 2014-12-25 ENCOUNTER — Encounter (INDEPENDENT_AMBULATORY_CARE_PROVIDER_SITE_OTHER): Payer: Self-pay

## 2014-12-25 ENCOUNTER — Ambulatory Visit
Admission: RE | Admit: 2014-12-25 | Discharge: 2014-12-25 | Disposition: A | Discharge status: Home / Self Care | Payer: Medicare Other | Source: Ambulatory Visit | Attending: Advanced Practice Midwife | Admitting: Advanced Practice Midwife

## 2014-12-25 DIAGNOSIS — R221 Localized swelling, mass and lump, neck: Secondary | ICD-10-CM | POA: Diagnosis not present

## 2015-01-02 ENCOUNTER — Other Ambulatory Visit: Payer: Self-pay | Admitting: Family Medicine

## 2015-01-02 DIAGNOSIS — R221 Localized swelling, mass and lump, neck: Secondary | ICD-10-CM

## 2015-01-08 DIAGNOSIS — N200 Calculus of kidney: Secondary | ICD-10-CM | POA: Diagnosis not present

## 2015-01-08 DIAGNOSIS — N201 Calculus of ureter: Secondary | ICD-10-CM | POA: Diagnosis not present

## 2015-01-09 ENCOUNTER — Ambulatory Visit
Admission: RE | Admit: 2015-01-09 | Discharge: 2015-01-09 | Disposition: A | Discharge status: Home / Self Care | Payer: Medicare Other | Source: Ambulatory Visit | Attending: Family Medicine | Admitting: Family Medicine

## 2015-01-09 DIAGNOSIS — R221 Localized swelling, mass and lump, neck: Secondary | ICD-10-CM

## 2015-01-09 MED ORDER — IOPAMIDOL (ISOVUE-300) INJECTION 61%
75.0000 mL | Freq: Once | INTRAVENOUS | Status: AC | PRN
  Administered 2015-01-09: 75 mL via INTRAVENOUS

## 2015-01-29 DIAGNOSIS — M859 Disorder of bone density and structure, unspecified: Secondary | ICD-10-CM | POA: Diagnosis not present

## 2015-01-29 DIAGNOSIS — M8589 Other specified disorders of bone density and structure, multiple sites: Secondary | ICD-10-CM | POA: Diagnosis not present

## 2015-05-26 DIAGNOSIS — M25571 Pain in right ankle and joints of right foot: Secondary | ICD-10-CM | POA: Diagnosis not present

## 2015-06-25 DIAGNOSIS — Z6836 Body mass index (BMI) 36.0-36.9, adult: Secondary | ICD-10-CM | POA: Diagnosis not present

## 2015-06-25 DIAGNOSIS — E7800 Pure hypercholesterolemia, unspecified: Secondary | ICD-10-CM | POA: Diagnosis not present

## 2015-06-25 DIAGNOSIS — E119 Type 2 diabetes mellitus without complications: Secondary | ICD-10-CM | POA: Diagnosis not present

## 2015-06-25 DIAGNOSIS — I10 Essential (primary) hypertension: Secondary | ICD-10-CM | POA: Diagnosis not present

## 2015-07-14 DIAGNOSIS — H1030 Unspecified acute conjunctivitis, unspecified eye: Secondary | ICD-10-CM | POA: Diagnosis not present

## 2015-07-14 DIAGNOSIS — R300 Dysuria: Secondary | ICD-10-CM | POA: Diagnosis not present

## 2015-07-14 DIAGNOSIS — N3000 Acute cystitis without hematuria: Secondary | ICD-10-CM | POA: Diagnosis not present

## 2015-08-06 DIAGNOSIS — H40013 Open angle with borderline findings, low risk, bilateral: Secondary | ICD-10-CM | POA: Diagnosis not present

## 2015-08-11 DIAGNOSIS — R34 Anuria and oliguria: Secondary | ICD-10-CM | POA: Diagnosis not present

## 2015-08-11 DIAGNOSIS — N200 Calculus of kidney: Secondary | ICD-10-CM | POA: Diagnosis not present

## 2015-08-11 DIAGNOSIS — Z0000 Encounter for general adult medical examination without abnormal findings: Secondary | ICD-10-CM | POA: Diagnosis not present

## 2015-09-14 DIAGNOSIS — H40013 Open angle with borderline findings, low risk, bilateral: Secondary | ICD-10-CM | POA: Diagnosis not present

## 2015-09-14 DIAGNOSIS — H2513 Age-related nuclear cataract, bilateral: Secondary | ICD-10-CM | POA: Diagnosis not present

## 2015-09-14 DIAGNOSIS — H40003 Preglaucoma, unspecified, bilateral: Secondary | ICD-10-CM | POA: Diagnosis not present

## 2015-09-14 DIAGNOSIS — E119 Type 2 diabetes mellitus without complications: Secondary | ICD-10-CM | POA: Diagnosis not present

## 2015-09-23 DIAGNOSIS — D485 Neoplasm of uncertain behavior of skin: Secondary | ICD-10-CM | POA: Diagnosis not present

## 2015-09-23 DIAGNOSIS — D225 Melanocytic nevi of trunk: Secondary | ICD-10-CM | POA: Diagnosis not present

## 2015-09-23 DIAGNOSIS — C44311 Basal cell carcinoma of skin of nose: Secondary | ICD-10-CM | POA: Diagnosis not present

## 2015-10-21 DIAGNOSIS — L570 Actinic keratosis: Secondary | ICD-10-CM | POA: Diagnosis not present

## 2015-10-21 DIAGNOSIS — Z08 Encounter for follow-up examination after completed treatment for malignant neoplasm: Secondary | ICD-10-CM | POA: Diagnosis not present

## 2015-10-21 DIAGNOSIS — X32XXXD Exposure to sunlight, subsequent encounter: Secondary | ICD-10-CM | POA: Diagnosis not present

## 2015-10-21 DIAGNOSIS — Z85828 Personal history of other malignant neoplasm of skin: Secondary | ICD-10-CM | POA: Diagnosis not present

## 2015-11-30 ENCOUNTER — Other Ambulatory Visit: Payer: Self-pay | Admitting: Family Medicine

## 2015-11-30 DIAGNOSIS — E7800 Pure hypercholesterolemia, unspecified: Secondary | ICD-10-CM | POA: Diagnosis not present

## 2015-11-30 DIAGNOSIS — R0789 Other chest pain: Secondary | ICD-10-CM | POA: Diagnosis not present

## 2015-11-30 DIAGNOSIS — I10 Essential (primary) hypertension: Secondary | ICD-10-CM | POA: Diagnosis not present

## 2015-11-30 DIAGNOSIS — N644 Mastodynia: Secondary | ICD-10-CM | POA: Diagnosis not present

## 2015-11-30 DIAGNOSIS — Z23 Encounter for immunization: Secondary | ICD-10-CM | POA: Diagnosis not present

## 2015-11-30 DIAGNOSIS — E119 Type 2 diabetes mellitus without complications: Secondary | ICD-10-CM | POA: Diagnosis not present

## 2015-11-30 DIAGNOSIS — F411 Generalized anxiety disorder: Secondary | ICD-10-CM | POA: Diagnosis not present

## 2015-12-03 ENCOUNTER — Ambulatory Visit
Admission: RE | Admit: 2015-12-03 | Discharge: 2015-12-03 | Disposition: A | Discharge status: Home / Self Care | Payer: Medicare Other | Source: Ambulatory Visit | Attending: Family Medicine | Admitting: Family Medicine

## 2015-12-03 DIAGNOSIS — N644 Mastodynia: Secondary | ICD-10-CM

## 2015-12-04 DIAGNOSIS — I10 Essential (primary) hypertension: Secondary | ICD-10-CM | POA: Diagnosis not present

## 2015-12-04 DIAGNOSIS — E785 Hyperlipidemia, unspecified: Secondary | ICD-10-CM | POA: Diagnosis not present

## 2015-12-04 DIAGNOSIS — E119 Type 2 diabetes mellitus without complications: Secondary | ICD-10-CM | POA: Diagnosis not present

## 2015-12-04 DIAGNOSIS — E668 Other obesity: Secondary | ICD-10-CM | POA: Diagnosis not present

## 2015-12-04 DIAGNOSIS — R0789 Other chest pain: Secondary | ICD-10-CM | POA: Diagnosis not present

## 2015-12-14 ENCOUNTER — Other Ambulatory Visit: Payer: Self-pay | Admitting: Cardiology

## 2015-12-14 ENCOUNTER — Ambulatory Visit
Admission: RE | Admit: 2015-12-14 | Discharge: 2015-12-14 | Disposition: A | Discharge status: Home / Self Care | Payer: Medicare Other | Source: Ambulatory Visit | Attending: Cardiology | Admitting: Cardiology

## 2015-12-14 DIAGNOSIS — R079 Chest pain, unspecified: Secondary | ICD-10-CM | POA: Diagnosis not present

## 2015-12-14 DIAGNOSIS — R0602 Shortness of breath: Secondary | ICD-10-CM

## 2015-12-15 DIAGNOSIS — R079 Chest pain, unspecified: Secondary | ICD-10-CM | POA: Diagnosis not present

## 2015-12-21 ENCOUNTER — Other Ambulatory Visit: Payer: Self-pay | Admitting: Urology

## 2015-12-21 ENCOUNTER — Encounter (HOSPITAL_COMMUNITY): Payer: Self-pay | Admitting: *Deleted

## 2015-12-21 DIAGNOSIS — N200 Calculus of kidney: Secondary | ICD-10-CM | POA: Diagnosis not present

## 2015-12-21 DIAGNOSIS — N132 Hydronephrosis with renal and ureteral calculous obstruction: Secondary | ICD-10-CM | POA: Diagnosis not present

## 2015-12-21 NOTE — Progress Notes (Signed)
Need orders signed off on in EPIC.  Surgery on 12/23/15.  Thank You

## 2015-12-21 NOTE — Progress Notes (Signed)
Chest 10/17 epic, ekg with stress test 12/15/15, lov dr Wynonia Lawman 12/04/15 chart, hgba1c 11/30/15, cmp 11/30/15, lov dr Lynnda Child 9/17 all on chart

## 2015-12-21 NOTE — Patient Instructions (Addendum)
Christine Navarro  12/21/2015   Your procedure is scheduled on: 12/23/15  Report to [facility] Main  Entrance take [place]  elevators to 3rd floor to  [place] [facility] [place]  AM.  Call this number if you have problems the morning of surgery [phone]   Remember: ONLY 1 PERSON MAY GO WITH YOU TO SHORT STAY TO GET  READY MORNING OF [place].  Do not eat food or drink liquids :After Midnight.     Take these medicines the morning of surgery with A SIP OF WATER: METOPROLOL,May take Ketoralc(torodal only if you have not taken Oxycodone), lorezapam(ativan), Percocet, Promethazine(phenergran) if needed DO NOT TAKE ANY DIABETIC MEDICATIONS DAY OF YOUR SURGERY                               You may not have any metal on your body including hair pins and              piercings  Do not wear jewelry, make-up, lotions, powders or perfumes, deodorant             Do not wear nail polish.  Do not shave  48 hours prior to surgery.              Men may shave face and neck.   Do not bring valuables to the hospital. [place].  Contacts, dentures or bridgework may not be worn into surgery.  Leave suitcase in the car. After surgery it may be brought to your room.     Patients discharged the day of surgery will not be allowed to drive home.  Name and phone number of your driver:daughter  Christine Navarro  Cell [phone]  Special Instructions: N/A              Please read over the following fact sheets you were given: _____________________________________________________________________             [facility] - Preparing for Surgery Before surgery, you can play an important role.  Because skin is not sterile, your skin needs to be as free of germs as possible.  You can reduce the number of germs on your skin by washing with CHG (chlorahexidine gluconate) soap before surgery.  CHG is an antiseptic cleaner which kills germs and  bonds with the skin to continue killing germs even after washing. Please DO NOT use if you have an allergy to CHG or antibacterial soaps.  If your skin becomes reddened/irritated stop using the CHG and inform your nurse when you arrive at Short Stay. Do not shave (including legs and underarms) for at least 48 hours prior to the first CHG shower.  You may shave your face/neck. Please follow these instructions carefully:  1.  Shower with CHG Soap the night before surgery and the  morning of Surgery.  2.  If you choose to wash your hair, wash your hair first as usual with your  normal  shampoo.  3.  After you shampoo, rinse your hair and body thoroughly to remove the  shampoo.                           4.  Use CHG  as you would any other liquid soap.  You can apply chg directly  to the skin and wash                       Gently with a scrungie or clean washcloth.  5.  Apply the CHG Soap to your body ONLY FROM THE NECK DOWN.   Do not use on face/ open                           Wound or open sores. Avoid contact with eyes, ears mouth and genitals (private parts).                       Wash face,  Genitals (private parts) with your normal soap.             6.  Wash thoroughly, paying special attention to the area where your surgery  will be performed.  7.  Thoroughly rinse your body with warm water from the neck down.  8.  DO NOT shower/wash with your normal soap after using and rinsing off  the CHG Soap.                9.  Pat yourself dry with a clean towel.            10.  Wear clean pajamas.            11.  Place clean sheets on your bed the night of your first shower and do not  sleep with pets. Day of Surgery : Do not apply any lotions/deodorants the morning of surgery.  Please wear clean clothes to the hospital/surgery center.  FAILURE TO FOLLOW THESE INSTRUCTIONS MAY RESULT IN THE CANCELLATION OF YOUR SURGERY PATIENT SIGNATURE_________________________________  NURSE  SIGNATURE__________________________________  ________________________________________________________________________

## 2015-12-22 ENCOUNTER — Encounter (HOSPITAL_COMMUNITY)
Admission: RE | Admit: 2015-12-22 | Discharge: 2015-12-22 | Disposition: A | Discharge status: Home / Self Care | Payer: Medicare Other | Source: Ambulatory Visit | Attending: Urology | Admitting: Urology

## 2015-12-22 ENCOUNTER — Encounter (HOSPITAL_COMMUNITY): Payer: Self-pay

## 2015-12-22 DIAGNOSIS — J45909 Unspecified asthma, uncomplicated: Secondary | ICD-10-CM | POA: Diagnosis not present

## 2015-12-22 DIAGNOSIS — Z8249 Family history of ischemic heart disease and other diseases of the circulatory system: Secondary | ICD-10-CM | POA: Diagnosis not present

## 2015-12-22 DIAGNOSIS — Z881 Allergy status to other antibiotic agents status: Secondary | ICD-10-CM | POA: Diagnosis not present

## 2015-12-22 DIAGNOSIS — Z882 Allergy status to sulfonamides status: Secondary | ICD-10-CM | POA: Diagnosis not present

## 2015-12-22 DIAGNOSIS — E119 Type 2 diabetes mellitus without complications: Secondary | ICD-10-CM | POA: Diagnosis not present

## 2015-12-22 DIAGNOSIS — M1990 Unspecified osteoarthritis, unspecified site: Secondary | ICD-10-CM | POA: Diagnosis not present

## 2015-12-22 DIAGNOSIS — N132 Hydronephrosis with renal and ureteral calculous obstruction: Secondary | ICD-10-CM | POA: Diagnosis not present

## 2015-12-22 DIAGNOSIS — G4730 Sleep apnea, unspecified: Secondary | ICD-10-CM | POA: Diagnosis not present

## 2015-12-22 DIAGNOSIS — Z8589 Personal history of malignant neoplasm of other organs and systems: Secondary | ICD-10-CM | POA: Diagnosis not present

## 2015-12-22 DIAGNOSIS — Z90710 Acquired absence of both cervix and uterus: Secondary | ICD-10-CM | POA: Diagnosis not present

## 2015-12-22 DIAGNOSIS — F419 Anxiety disorder, unspecified: Secondary | ICD-10-CM | POA: Diagnosis not present

## 2015-12-22 DIAGNOSIS — Z87442 Personal history of urinary calculi: Secondary | ICD-10-CM | POA: Diagnosis not present

## 2015-12-22 DIAGNOSIS — Z809 Family history of malignant neoplasm, unspecified: Secondary | ICD-10-CM | POA: Diagnosis not present

## 2015-12-22 DIAGNOSIS — I10 Essential (primary) hypertension: Secondary | ICD-10-CM | POA: Diagnosis not present

## 2015-12-22 DIAGNOSIS — Z888 Allergy status to other drugs, medicaments and biological substances status: Secondary | ICD-10-CM | POA: Diagnosis not present

## 2015-12-22 HISTORY — DX: Sleep apnea, unspecified: G47.30

## 2015-12-22 HISTORY — DX: Unspecified osteoarthritis, unspecified site: M19.90

## 2015-12-22 HISTORY — DX: Personal history of urinary calculi: Z87.442

## 2015-12-22 HISTORY — DX: Basal cell carcinoma of skin, unspecified: C44.91

## 2015-12-22 LAB — DIFFERENTIAL
BAND NEUTROPHILS: 0 %
Basophils Absolute: 0 10*3/uL (ref 0.0–0.1)
Basophils Relative: 0 %
Blasts: 1 %
EOS ABS: 1 10*3/uL — AB (ref 0.0–0.7)
Eosinophils Relative: 10 %
LYMPHS PCT: 10 %
Lymphs Abs: 1 10*3/uL (ref 0.7–4.0)
MONO ABS: 0.6 10*3/uL (ref 0.1–1.0)
Metamyelocytes Relative: 0 %
Monocytes Relative: 6 %
Myelocytes: 0 %
Neutro Abs: 7 10*3/uL (ref 1.7–7.7)
Neutrophils Relative %: 73 %
OTHER: 0 %
PROMYELOCYTES ABS: 0 %
nRBC: 0 /100 WBC

## 2015-12-22 LAB — CBC
HCT: 40 % (ref 36.0–46.0)
HEMOGLOBIN: 12.8 g/dL (ref 12.0–15.0)
MCH: 28.8 pg (ref 26.0–34.0)
MCHC: 32 g/dL (ref 30.0–36.0)
MCV: 90.1 fL (ref 78.0–100.0)
PLATELETS: 239 10*3/uL (ref 150–400)
RBC: 4.44 MIL/uL (ref 3.87–5.11)
RDW: 14.3 % (ref 11.5–15.5)
WBC: 9.6 10*3/uL (ref 4.0–10.5)

## 2015-12-22 LAB — PATHOLOGIST SMEAR REVIEW

## 2015-12-22 NOTE — Progress Notes (Signed)
Spoke with dr Thurman Coyer office yesterday at 415pm.  Dr Wynonia Lawman is aware of surgery and he is aware her eccho hasnt been done yet due to kidney stone.    Clearence DR Wynonia Lawman PLACED ON CHART

## 2015-12-23 ENCOUNTER — Encounter (HOSPITAL_COMMUNITY): Payer: Self-pay | Admitting: Certified Registered"

## 2015-12-23 ENCOUNTER — Ambulatory Visit (HOSPITAL_COMMUNITY): Payer: Medicare Other | Admitting: Anesthesiology

## 2015-12-23 ENCOUNTER — Ambulatory Visit (HOSPITAL_COMMUNITY)
Admission: RE | Admit: 2015-12-23 | Discharge: 2015-12-23 | Disposition: A | Discharge status: Home / Self Care | Payer: Medicare Other | Source: Ambulatory Visit | Attending: Urology | Admitting: Urology

## 2015-12-23 ENCOUNTER — Encounter (HOSPITAL_COMMUNITY)
Admission: RE | Disposition: A | Discharge status: Home / Self Care | Payer: Self-pay | Source: Ambulatory Visit | Attending: Urology

## 2015-12-23 DIAGNOSIS — G4730 Sleep apnea, unspecified: Secondary | ICD-10-CM | POA: Insufficient documentation

## 2015-12-23 DIAGNOSIS — Z882 Allergy status to sulfonamides status: Secondary | ICD-10-CM | POA: Insufficient documentation

## 2015-12-23 DIAGNOSIS — Z90710 Acquired absence of both cervix and uterus: Secondary | ICD-10-CM | POA: Insufficient documentation

## 2015-12-23 DIAGNOSIS — Z8249 Family history of ischemic heart disease and other diseases of the circulatory system: Secondary | ICD-10-CM | POA: Diagnosis not present

## 2015-12-23 DIAGNOSIS — F419 Anxiety disorder, unspecified: Secondary | ICD-10-CM | POA: Insufficient documentation

## 2015-12-23 DIAGNOSIS — J45909 Unspecified asthma, uncomplicated: Secondary | ICD-10-CM | POA: Diagnosis not present

## 2015-12-23 DIAGNOSIS — Z8589 Personal history of malignant neoplasm of other organs and systems: Secondary | ICD-10-CM | POA: Insufficient documentation

## 2015-12-23 DIAGNOSIS — I10 Essential (primary) hypertension: Secondary | ICD-10-CM | POA: Insufficient documentation

## 2015-12-23 DIAGNOSIS — N201 Calculus of ureter: Secondary | ICD-10-CM | POA: Diagnosis not present

## 2015-12-23 DIAGNOSIS — N132 Hydronephrosis with renal and ureteral calculous obstruction: Secondary | ICD-10-CM | POA: Insufficient documentation

## 2015-12-23 DIAGNOSIS — Z809 Family history of malignant neoplasm, unspecified: Secondary | ICD-10-CM | POA: Insufficient documentation

## 2015-12-23 DIAGNOSIS — N202 Calculus of kidney with calculus of ureter: Secondary | ICD-10-CM | POA: Diagnosis not present

## 2015-12-23 DIAGNOSIS — E119 Type 2 diabetes mellitus without complications: Secondary | ICD-10-CM | POA: Insufficient documentation

## 2015-12-23 DIAGNOSIS — Z881 Allergy status to other antibiotic agents status: Secondary | ICD-10-CM | POA: Insufficient documentation

## 2015-12-23 DIAGNOSIS — Z87442 Personal history of urinary calculi: Secondary | ICD-10-CM | POA: Diagnosis not present

## 2015-12-23 DIAGNOSIS — M1990 Unspecified osteoarthritis, unspecified site: Secondary | ICD-10-CM | POA: Diagnosis not present

## 2015-12-23 DIAGNOSIS — Z888 Allergy status to other drugs, medicaments and biological substances status: Secondary | ICD-10-CM | POA: Insufficient documentation

## 2015-12-23 DIAGNOSIS — N200 Calculus of kidney: Secondary | ICD-10-CM | POA: Diagnosis not present

## 2015-12-23 HISTORY — PX: CYSTOSCOPY WITH RETROGRADE PYELOGRAM, URETEROSCOPY AND STENT PLACEMENT: SHX5789

## 2015-12-23 HISTORY — PX: HOLMIUM LASER APPLICATION: SHX5852

## 2015-12-23 LAB — GLUCOSE, CAPILLARY
Glucose-Capillary: 148 mg/dL — ABNORMAL HIGH (ref 65–99)
Glucose-Capillary: 156 mg/dL — ABNORMAL HIGH (ref 65–99)
Glucose-Capillary: 169 mg/dL — ABNORMAL HIGH (ref 65–99)

## 2015-12-23 SURGERY — CYSTOURETEROSCOPY, WITH RETROGRADE PYELOGRAM AND STENT INSERTION
Anesthesia: General | Laterality: Right

## 2015-12-23 MED ORDER — SENNOSIDES-DOCUSATE SODIUM 8.6-50 MG PO TABS: 1.0000 | ORAL_TABLET | Freq: Two times a day (BID) | ORAL | 0 refills | Status: DC

## 2015-12-23 MED ORDER — SODIUM CHLORIDE 0.9 % IR SOLN
Status: DC | PRN
  Administered 2015-12-23: 4000 mL

## 2015-12-23 MED ORDER — SCOPOLAMINE 1 MG/3DAYS TD PT72
MEDICATED_PATCH | TRANSDERMAL | Status: DC | PRN
  Administered 2015-12-23: 1 via TRANSDERMAL

## 2015-12-23 MED ORDER — SCOPOLAMINE 1 MG/3DAYS TD PT72
MEDICATED_PATCH | TRANSDERMAL | Status: AC
  Filled 2015-12-23: qty 1

## 2015-12-23 MED ORDER — ONDANSETRON HCL 4 MG/2ML IJ SOLN: 4.0000 mg | Freq: Once | INTRAMUSCULAR | Status: DC | PRN

## 2015-12-23 MED ORDER — GENTAMICIN IN SALINE 1.6-0.9 MG/ML-% IV SOLN: 80.0000 mg | INTRAVENOUS | Status: DC

## 2015-12-23 MED ORDER — MIDAZOLAM HCL 5 MG/5ML IJ SOLN
INTRAMUSCULAR | Status: DC | PRN
  Administered 2015-12-23: 2 mg via INTRAVENOUS

## 2015-12-23 MED ORDER — LIDOCAINE 2% (20 MG/ML) 5 ML SYRINGE
INTRAMUSCULAR | Status: DC | PRN
  Administered 2015-12-23: 100 mg via INTRAVENOUS

## 2015-12-23 MED ORDER — DEXAMETHASONE SODIUM PHOSPHATE 10 MG/ML IJ SOLN
INTRAMUSCULAR | Status: DC | PRN
  Administered 2015-12-23: 10 mg via INTRAVENOUS

## 2015-12-23 MED ORDER — CEPHALEXIN 500 MG PO CAPS: 500.0000 mg | ORAL_CAPSULE | Freq: Two times a day (BID) | ORAL | 0 refills | Status: AC

## 2015-12-23 MED ORDER — LIDOCAINE 2% (20 MG/ML) 5 ML SYRINGE
INTRAMUSCULAR | Status: AC
  Filled 2015-12-23: qty 5

## 2015-12-23 MED ORDER — FENTANYL CITRATE (PF) 100 MCG/2ML IJ SOLN
INTRAMUSCULAR | Status: AC
  Administered 2015-12-23: 50 ug via INTRAVENOUS
  Filled 2015-12-23: qty 2

## 2015-12-23 MED ORDER — OXYCODONE-ACETAMINOPHEN 5-325 MG PO TABS: 1.0000 | ORAL_TABLET | ORAL | 0 refills | Status: DC | PRN

## 2015-12-23 MED ORDER — PROPOFOL 10 MG/ML IV BOLUS
INTRAVENOUS | Status: DC | PRN
  Administered 2015-12-23: 180 mg via INTRAVENOUS

## 2015-12-23 MED ORDER — ONDANSETRON HCL 4 MG/2ML IJ SOLN
INTRAMUSCULAR | Status: DC | PRN
  Administered 2015-12-23: 4 mg via INTRAVENOUS

## 2015-12-23 MED ORDER — 0.9 % SODIUM CHLORIDE (POUR BTL) OPTIME
TOPICAL | Status: DC | PRN
  Administered 2015-12-23: 1000 mL

## 2015-12-23 MED ORDER — FENTANYL CITRATE (PF) 100 MCG/2ML IJ SOLN
25.0000 ug | INTRAMUSCULAR | Status: DC | PRN
  Administered 2015-12-23 (×2): 50 ug via INTRAVENOUS

## 2015-12-23 MED ORDER — BELLADONNA ALKALOIDS-OPIUM 16.2-60 MG RE SUPP
1.0000 | Freq: Once | RECTAL | Status: AC
  Administered 2015-12-23: 1 via RECTAL
  Filled 2015-12-23: qty 1

## 2015-12-23 MED ORDER — DEXAMETHASONE SODIUM PHOSPHATE 10 MG/ML IJ SOLN
INTRAMUSCULAR | Status: AC
Start: 2015-12-23 — End: 2015-12-23
  Filled 2015-12-23: qty 1

## 2015-12-23 MED ORDER — GENTAMICIN SULFATE 40 MG/ML IJ SOLN
340.0000 mg | INTRAVENOUS | Status: AC
  Administered 2015-12-23: 340 mg via INTRAVENOUS
  Filled 2015-12-23: qty 8.5

## 2015-12-23 MED ORDER — IOHEXOL 300 MG/ML  SOLN
INTRAMUSCULAR | Status: DC | PRN
  Administered 2015-12-23: 20 mL

## 2015-12-23 MED ORDER — LACTATED RINGERS IV SOLN
INTRAVENOUS | Status: DC | PRN
Start: 2015-12-23 — End: 2015-12-23
  Administered 2015-12-23 (×2): via INTRAVENOUS

## 2015-12-23 MED ORDER — ONDANSETRON HCL 4 MG/2ML IJ SOLN
INTRAMUSCULAR | Status: AC
Start: 2015-12-23 — End: 2015-12-23
  Filled 2015-12-23: qty 2

## 2015-12-23 MED ORDER — FENTANYL CITRATE (PF) 100 MCG/2ML IJ SOLN
INTRAMUSCULAR | Status: DC | PRN
  Administered 2015-12-23: 50 ug via INTRAVENOUS
  Administered 2015-12-23 (×2): 25 ug via INTRAVENOUS

## 2015-12-23 MED ORDER — MIDAZOLAM HCL 2 MG/2ML IJ SOLN
INTRAMUSCULAR | Status: AC
Start: 2015-12-23 — End: 2015-12-23
  Filled 2015-12-23: qty 2

## 2015-12-23 MED ORDER — PROPOFOL 10 MG/ML IV BOLUS
INTRAVENOUS | Status: AC
  Filled 2015-12-23: qty 20

## 2015-12-23 MED ORDER — FENTANYL CITRATE (PF) 100 MCG/2ML IJ SOLN
INTRAMUSCULAR | Status: AC
  Filled 2015-12-23: qty 2

## 2015-12-23 MED ORDER — LACTATED RINGERS IV SOLN: INTRAVENOUS | Status: DC

## 2015-12-23 SURGICAL SUPPLY — 23 items
BAG URO CATCHER STRL LF (MISCELLANEOUS) ×3 IMPLANT
BASKET LASER NITINOL 1.9FR (BASKET) ×2 IMPLANT
BSKT STON RTRVL 120 1.9FR (BASKET) ×1
CATH INTERMIT  6FR 70CM (CATHETERS) ×3 IMPLANT
CLOTH BEACON ORANGE TIMEOUT ST (SAFETY) ×3 IMPLANT
FIBER LASER FLEXIVA 1000 (UROLOGICAL SUPPLIES) IMPLANT
FIBER LASER FLEXIVA 365 (UROLOGICAL SUPPLIES) IMPLANT
FIBER LASER FLEXIVA 550 (UROLOGICAL SUPPLIES) IMPLANT
FIBER LASER TRAC TIP (UROLOGICAL SUPPLIES) ×2 IMPLANT
GLOVE BIOGEL M STRL SZ7.5 (GLOVE) ×3 IMPLANT
GOWN STRL REUS W/TWL LRG LVL3 (GOWN DISPOSABLE) ×6 IMPLANT
GUIDEWIRE ANG ZIPWIRE 038X150 (WIRE) ×3 IMPLANT
GUIDEWIRE STR DUAL SENSOR (WIRE) ×3 IMPLANT
IV NS 1000ML (IV SOLUTION)
IV NS 1000ML BAXH (IV SOLUTION) ×1 IMPLANT
MANIFOLD NEPTUNE II (INSTRUMENTS) ×3 IMPLANT
PACK CYSTO (CUSTOM PROCEDURE TRAY) ×3 IMPLANT
SHEATH ACCESS URETERAL 24CM (SHEATH) ×2 IMPLANT
STENT POLARIS 5FRX22 (STENTS) ×2 IMPLANT
SYR CONTROL 10ML LL (SYRINGE) ×3 IMPLANT
TUBE FEEDING 8FR 16IN STR KANG (MISCELLANEOUS) ×3 IMPLANT
TUBING CONNECTING 10 (TUBING) ×2 IMPLANT
TUBING CONNECTING 10' (TUBING) ×1

## 2015-12-23 NOTE — Anesthesia Preprocedure Evaluation (Addendum)
Anesthesia Evaluation  Patient identified by MRN, date of birth, ID band Patient awake    Reviewed: Allergy & Precautions, NPO status , Patient's Chart, lab work & pertinent test results  History of Anesthesia Complications (+) PONV and history of anesthetic complications  Airway Mallampati: II  TM Distance: >3 FB Neck ROM: Full    Dental no notable dental hx. (+) Dental Advisory Given   Pulmonary asthma , sleep apnea ,    Pulmonary exam normal breath sounds clear to auscultation       Cardiovascular hypertension, Normal cardiovascular exam Rhythm:Regular Rate:Normal  Seen by Dr. Fidela Juneau for chest pain recently. Per his notes, stress test and EKG negative for signs of ischemia. Cleared for surgery. Echo pending but patient reports no limitations in activity or other cardiac symptoms   Neuro/Psych negative neurological ROS  negative psych ROS   GI/Hepatic negative GI ROS, Neg liver ROS,   Endo/Other  diabetesobesity  Renal/GU negative Renal ROS  negative genitourinary   Musculoskeletal  (+) Arthritis ,   Abdominal   Peds negative pediatric ROS (+)  Hematology negative hematology ROS (+)   Anesthesia Other Findings   Reproductive/Obstetrics negative OB ROS                            Anesthesia Physical Anesthesia Plan  ASA: II  Anesthesia Plan: General   Post-op Pain Management:    Induction: Intravenous  Airway Management Planned: LMA  Additional Equipment:   Intra-op Plan:   Post-operative Plan: Extubation in OR  Informed Consent: I have reviewed the patients History and Physical, chart, labs and discussed the procedure including the risks, benefits and alternatives for the proposed anesthesia with the patient or authorized representative who has indicated his/her understanding and acceptance.   Dental advisory given  Plan Discussed with: CRNA  Anesthesia Plan Comments:          Anesthesia Quick Evaluation

## 2015-12-23 NOTE — Anesthesia Postprocedure Evaluation (Signed)
Anesthesia Post Note  Patient: NORABELLE SALES  Procedure(s) Performed: Procedure(s) (LRB): CYSTOSCOPY WITH RETROGRADE PYELOGRAM, URETEROSCOPY AND STENT PLACEMENT (Right) HOLMIUM LASER APPLICATION (Right)  Patient location during evaluation: PACU Anesthesia Type: General Level of consciousness: awake and alert Pain management: pain level controlled Vital Signs Assessment: post-procedure vital signs reviewed and stable Respiratory status: spontaneous breathing, nonlabored ventilation, respiratory function stable and patient connected to nasal cannula oxygen Cardiovascular status: blood pressure returned to baseline and stable Postop Assessment: no signs of nausea or vomiting Anesthetic complications: no    Last Vitals:  Vitals:   12/23/15 1237 12/23/15 1349  BP: (!) 156/90 (!) 161/68  Pulse: 74   Resp: 16   Temp: 36.7 C     Last Pain:  Vitals:   12/23/15 1240  TempSrc:   PainSc: 8                  Danylle Ouk JENNETTE

## 2015-12-23 NOTE — Anesthesia Procedure Notes (Signed)
Procedure Name: LMA Insertion Date/Time: 12/23/2015 10:19 AM Performed by: Noralyn Pick D Pre-anesthesia Checklist: Patient identified, Emergency Drugs available, Suction available and Patient being monitored Patient Re-evaluated:Patient Re-evaluated prior to inductionOxygen Delivery Method: Circle system utilized Preoxygenation: Pre-oxygenation with 100% oxygen Intubation Type: IV induction Ventilation: Mask ventilation without difficulty LMA Size: 4.0 Tube type: Oral Number of attempts: 1 Placement Confirmation: positive ETCO2 and breath sounds checked- equal and bilateral Tube secured with: Tape Dental Injury: Teeth and Oropharynx as per pre-operative assessment

## 2015-12-23 NOTE — Transfer of Care (Signed)
Immediate Anesthesia Transfer of Care Note  Patient: Christine Navarro  Procedure(s) Performed: Procedure(s): CYSTOSCOPY WITH RETROGRADE PYELOGRAM, URETEROSCOPY AND STENT PLACEMENT (Right) HOLMIUM LASER APPLICATION (Right)  Patient Location: PACU  Anesthesia Type:General  Level of Consciousness: awake, alert  and oriented  Airway & Oxygen Therapy: Patient Spontanous Breathing and Patient connected to face mask oxygen  Post-op Assessment: Report given to RN and Post -op Vital signs reviewed and stable  Post vital signs: Reviewed and stable  Last Vitals:  Vitals:   12/23/15 0922  BP: (!) 167/81  Pulse: 75  Resp: 16  Temp: 36.7 C    Last Pain:  Vitals:   12/23/15 0922  TempSrc: Oral         Complications: No apparent anesthesia complications

## 2015-12-23 NOTE — Brief Op Note (Signed)
12/23/2015  11:27 AM  PATIENT:  Christine Navarro  65 y.o. female  PRE-OPERATIVE DIAGNOSIS:  RIGHT URETERAL AND RENAL STONES  POST-OPERATIVE DIAGNOSIS:  RIGHT URETERAL AND RENAL STONES  PROCEDURE:  Procedure(s): CYSTOSCOPY WITH RETROGRADE PYELOGRAM, URETEROSCOPY AND STENT PLACEMENT (Right) HOLMIUM LASER APPLICATION (Right)  SURGEON:  Surgeon(s) and Role:    * Alexis Frock, MD - Primary  PHYSICIAN ASSISTANT:   ASSISTANTS: none   ANESTHESIA:   general  EBL:  Total I/O In: 1000 [I.V.:1000] Out: 0   BLOOD ADMINISTERED:none  DRAINS: none   LOCAL MEDICATIONS USED:  NONE  SPECIMEN:  Source of Specimen:  Rt renal stone fragments  DISPOSITION OF SPECIMEN:  [facility] for compositional analysis  COUNTS:  YES  TOURNIQUET:  * No tourniquets in log *  DICTATION: .Other Dictation: Dictation Number [id number]  PLAN OF CARE: Discharge to home after PACU  PATIENT DISPOSITION:  PACU - hemodynamically stable.   Delay start of Pharmacological VTE agent (>24hrs) due to surgical blood loss or risk of bleeding: yes

## 2015-12-23 NOTE — Discharge Instructions (Signed)
1 - You may have urinary urgency (bladder spasms) and bloody urine on / off with stent in place. This is normal.  2 - Remove tethered stent on Friday morning at home by pulling on string, then blue-white plastic tubing, and discarding. Office is open Friday if any acute issues arise.   3 - Call MD or go to ER for fever >102, severe pain / nausea / vomiting not relieved by medications, or acute change in medical status  General Anesthesia, Adult, Care After Refer to this sheet in the next few weeks. These instructions provide you with information on caring for yourself after your procedure. Your health care provider may also give you more specific instructions. Your treatment has been planned according to current medical practices, but problems sometimes occur. Call your health care provider if you have any problems or questions after your procedure. WHAT TO EXPECT AFTER THE PROCEDURE After the procedure, it is typical to experience:  Sleepiness.  Nausea and vomiting. HOME CARE INSTRUCTIONS  For the first 24 hours after general anesthesia:  Have a responsible person with you.  Do not drive a car. If you are alone, do not take public transportation.  Do not drink alcohol.  Do not take medicine that has not been prescribed by your health care provider.  Do not sign important papers or make important decisions.  You may resume a normal diet and activities as directed by your health care provider.  Change bandages (dressings) as directed.  If you have questions or problems that seem related to general anesthesia, call the hospital and ask for the anesthetist or anesthesiologist on call. SEEK MEDICAL CARE IF:  You have nausea and vomiting that continue the day after anesthesia.  You develop a rash. SEEK IMMEDIATE MEDICAL CARE IF:   You have difficulty breathing.  You have chest pain.  You have any allergic problems.   This information is not intended to replace advice given to  you by your health care provider. Make sure you discuss any questions you have with your health care provider.   Document Released: 06/06/2000 Document Revised: 03/21/2014 Document Reviewed: 06/29/2011 Elsevier Interactive Patient Education [employer].

## 2015-12-23 NOTE — H&P (Signed)
Christine Navarro is an 64 y.o. female.    Chief Complaint:   HPI:   1 - Recurrent Nephrolithiasis -   Pre 2016 SWLx several, URS x several   11/2014 SWL 108mm left prox ureteral stone, CT Also bilat renal stones, non-obstrucitng including Rt lower pole 1cm.    Recent Surveillance:   07/2015 [facility] - stable RLP 41mm, RMP 8mm x2, Lt stone free.  12/2015 - CT 24mm Rt distal ureteral and Rt intrarenal 59mm x2 with mod hydro.     PMH sig for DM2, obesity, benign hyst, open chole. Her PCP is Jeanette Caprice MD.    Today " Christine Navarro " is seen to proceed with right ureteroscopy with goal of right side stone free. No interval fevers. Most recent UA without infectious parameters.    Past Medical History:  Diagnosis Date  . Anginal pain ([place])    evaluated by Dr Wynonia Lawman, records on chart   . Anxiety   . Arthritis   . Asthma   . Basal cell adenocarcinoma    nose  . Diabetes mellitus without complication ([place])   . History of kidney stones   . Hypertension   . Pneumonia   . PONV (postoperative nausea and vomiting)   . Sleep apnea    mild per pt-  no CPAP    Past Surgical History:  Procedure Laterality Date  . ABDOMINAL HYSTERECTOMY    . APPENDECTOMY    . BREAST SURGERY     x 3  . CHOLECYSTECTOMY    . DIAGNOSTIC LAPAROSCOPY    . salpingectomy with oophorectomy      Family History  Problem Relation Age of Onset  . Cancer Mother   . Heart disease Father    Social History:  reports that she has never smoked. She has never used smokeless tobacco. She reports that she does not drink alcohol or use drugs.  Allergies:  Allergies  Allergen Reactions  . Ace Inhibitors Cough  . Ciprofloxacin Other (See Comments)    Muscle Cramps  . Sular [Nisoldipine Er] Other (See Comments)    HEART RACING  . Vitamin D Analogs Nausea And Vomiting  . Welchol [Colesevelam Hcl] Nausea And Vomiting  . Erythromycin Nausea And Vomiting  . Sulfa Antibiotics Rash  . Tape Rash    Use paper tape please     No prescriptions prior to admission.    Results for orders placed or performed during the hospital encounter of 12/22/15 (from the past 48 hour(s))  CBC     Status: None   Collection Time: 12/22/15 10:55 AM  Result Value Ref Range   WBC 9.6 4.0 - 10.5 K/uL    Comment: WHITE COUNT CONFIRMED ON SMEAR   RBC 4.44 3.87 - 5.11 MIL/uL   Hemoglobin 12.8 12.0 - 15.0 g/dL   HCT 40.0 36.0 - 46.0 %   MCV 90.1 78.0 - 100.0 fL   MCH 28.8 26.0 - 34.0 pg   MCHC 32.0 30.0 - 36.0 g/dL   RDW 14.3 11.5 - 15.5 %   Platelets 239 150 - 400 K/uL  Differential     Status: Abnormal   Collection Time: 12/22/15 10:55 AM  Result Value Ref Range   Neutrophils Relative % 73 %   Lymphocytes Relative 10 %   Monocytes Relative 6 %   Eosinophils Relative 10 %   Basophils Relative 0 %   Band Neutrophils 0 %   Metamyelocytes Relative 0 %   Myelocytes 0 %   Promyelocytes  Absolute 0 %   Blasts 1 %   nRBC 0 0 /100 WBC   Other 0 %   Neutro Abs 7.0 1.7 - 7.7 K/uL   Lymphs Abs 1.0 0.7 - 4.0 K/uL   Monocytes Absolute 0.6 0.1 - 1.0 K/uL   Eosinophils Absolute 1.0 (H) 0.0 - 0.7 K/uL   Basophils Absolute 0.0 0.0 - 0.1 K/uL   WBC Morphology BLASTS     Comment: MILD LEFT SHIFT (1-5% METAS, OCC MYELO, OCC BANDS)  Pathologist smear review     Status: None   Collection Time: 12/22/15  3:00 PM  Result Value Ref Range   Path Review Reviewed By Violet Baldy, M.D.     Comment: [id number] NEUTROPHILIC LEFT SHIFT WITH OCCASIONAL MYELOCYTES PRESENT. RARE BLASTIC CELL SEEN ON SCAN.    No results found.  Review of Systems  Constitutional: Negative.  Negative for chills and fever.  HENT: Negative.   Eyes: Negative.   Respiratory: Negative.   Cardiovascular: Negative.   Gastrointestinal: Negative.   Genitourinary: Positive for flank pain and frequency.  Skin: Negative.   Neurological: Negative.   Endo/Heme/Allergies: Negative.   Psychiatric/Behavioral: Negative.     There were no vitals taken for this  visit. Physical Exam  Constitutional: She is oriented to person, place, and time. She appears well-developed.  HENT:  Head: Normocephalic.  Eyes: Pupils are equal, round, and reactive to light.  Neck: Normal range of motion.  Cardiovascular: Normal rate.   Respiratory: Effort normal.  GI: Soft.  Genitourinary:  Genitourinary Comments: Moderate Rt CVAT  Musculoskeletal: Normal range of motion.  Neurological: She is alert and oriented to person, place, and time.  Skin: Skin is warm.  Psychiatric: She has a normal mood and affect. Her behavior is normal. Judgment and thought content normal.     Assessment/Plan   1 - Recurrent Nephrolithiasis - proceed today as planned with right ureterosopy / laser / stent placement. Risks, benefits, alternatives, and expected peri-op course as well as need for possible staged approach discussed previouslyi and reiterated today.   Alexis Frock, MD 12/23/2015, 7:06 AM

## 2015-12-24 DIAGNOSIS — N200 Calculus of kidney: Secondary | ICD-10-CM | POA: Diagnosis not present

## 2015-12-24 NOTE — Op Note (Signed)
NAMEHUNTLEE, WINSETT NO.:  [id number]  MEDICAL RECORD NO.:  [id number]  LOCATION:  [facility]                         FACILITY:  [facility]  PHYSICIAN:  Alexis Frock, MD     DATE OF BIRTH:  1951-01-07  DATE OF PROCEDURE: 12/23/2015                               OPERATIVE REPORT   DIAGNOSIS:  Right ureteral and renal stones with colic.  PROCEDURES: 1. Cystoscopy with right retrograde pyelogram and interpretation. 2. Right ureteroscopy with laser lithotripsy. 3. Insertion of right ureteral stent, 5 x 22 Polaris with tether.  ESTIMATED BLOOD LOSS:  Nil.  COMPLICATION:  None.  SPECIMENS:  Right ureteral and renal stone fragments for compositional analysis.  FINDINGS: 1. Interval passage of prior right ureteral stone. 2. Multifocal right intrarenal stones, total volume approximately 1.1     cm. 3. Complete resolution of all stone fragments larger than 1/3rd mm     following laser lithotripsy and basket extraction within the right     kidney and ureter. 4. Successful placement of right ureteral stent, proximal in renal     pelvis and distal in urinary bladder with tether fashioned to the     leg.  INDICATION:  Ms. Hohensee is a pleasant 65 year old lady with history of recurrent nephrolithiasis.  She has been on surveillance for known right intrarenal stones for some time.  She unfortunately presented with acute onset of colic last week and was found to have a right distal ureteral stone in addition to fairly large volume right-sided intrarenal stones. Options were discussed for management including medical therapy alone versus shockwave lithotripsy versus ureteroscopy with the goal being ipsilateral stone free with the latter and she wished to proceed.  She did actually pass her ureteral stone in the interval brings that with her preoperatively today.  We did discuss proceeding with surgery versus not and she adamantly wished to proceed with goal of ipsilateral  stone free and elected to proceed.  Informed consent was obtained and placed in the medical record.  PROCEDURE IN DETAIL:  The patient being Sandara Neukam, was verified. Procedure being right ureteroscopic stone manipulation was confirmed. Procedure was carried out.  Time-out was performed.  Intravenous antibiotics were administered.  General LMA anesthesia was introduced. The patient was placed into a low lithotomy position and sterile field was created by prepping and draping the patient's vagina, introitus, and proximal thighs using iodine x3.  Next, cystourethroscopy was performed using a 21-French rigid cystoscope with offset lens.  Inspection of the urinary bladder revealed no diverticula, calcifications, papillary lesions.  The right ureteral orifice was cannulated with a 6-French end- hole catheter and right retrograde pyelogram was obtained.  Right retrograde pyelogram demonstrated a single right ureter with single-system right kidney.  No filling defects or narrowing noted within the right ureter.  There was no hydronephrosis at this time. There were several radiodensities overlying the right kidney in the mid and lower poles consistent with known stone.  A 0.038 Zip wire was advanced to the level of the upper pole, set aside as a safety wire.  An 8-French feeding tube placed in urinary bladder for pressure release. Next, semi-rigid ureteroscopy was performed to the distal  four-fifths of the right ureter alongside a separate Sensor working wire.  No mucosal abnormalities were found.  The semi-rigid scope was exchanged for a 12/14 semi-rigid ureteral access sheath at the level of the proximal ureter using continuous fluoroscopic guidance.  Next, flexible digital ureteroscopy was performed of the proximal ureter and systematic inspection of the right kidney including all calices x2.  As anticipated, there were multifocal intrarenal stones, the dominant stone being in the lower  pole.  This was acutely angled to the path of tract. As such, an Escape basket was used to grab this large lower pole stone and repositioned into the upper pole to allow more straight on angulation.  Next, holmium laser energy applied to all intrarenal stones using settings of 0.3 joules and 30 hertz using a dusting technique, which ablated approximately half the volume of the stone and then a fragmentation technique with the remaining volume of stone forming fragments approximately 1-2 mm in diameter.  Next, the Escape basket was once again used to remove each fragment individually on its long axis and was set aside for compositional analysis.  Following this, there was complete resolution of all stone fragments larger than 1/3rd mm.  No evidence of renal perforation.  We had achieved the goals of surgery today.  The access sheath was removed under continuous ureteroscopic vision.  No mucosal abnormalities were found.  Given the use of access sheath and large volume stone, it was felt that interval stenting would be warranted.  As such, a new 5 x 22 Polaris-type stent was placed over the remaining safety wire.  Good proximal and distal deployment were noted.  Tether was left in place and fashioned to the leg and procedure was terminated.  The patient tolerated the procedure well.  There were no immediate periprocedural complications.  The patient was taken to the postanesthesia care unit in stable condition.          ______________________________ Alexis Frock, MD     TM/MEDQ  D:  12/23/2015  T:  12/24/2015  Job:  [id number]

## 2016-01-14 DIAGNOSIS — N200 Calculus of kidney: Secondary | ICD-10-CM | POA: Diagnosis not present

## 2016-01-14 DIAGNOSIS — R34 Anuria and oliguria: Secondary | ICD-10-CM | POA: Diagnosis not present

## 2016-01-18 DIAGNOSIS — R079 Chest pain, unspecified: Secondary | ICD-10-CM | POA: Diagnosis not present

## 2016-03-21 DIAGNOSIS — F411 Generalized anxiety disorder: Secondary | ICD-10-CM | POA: Diagnosis not present

## 2016-03-21 DIAGNOSIS — N3281 Overactive bladder: Secondary | ICD-10-CM | POA: Diagnosis not present

## 2016-03-21 DIAGNOSIS — I517 Cardiomegaly: Secondary | ICD-10-CM | POA: Diagnosis not present

## 2016-03-21 DIAGNOSIS — J45909 Unspecified asthma, uncomplicated: Secondary | ICD-10-CM | POA: Diagnosis not present

## 2016-03-21 DIAGNOSIS — E119 Type 2 diabetes mellitus without complications: Secondary | ICD-10-CM | POA: Diagnosis not present

## 2016-03-21 DIAGNOSIS — Z23 Encounter for immunization: Secondary | ICD-10-CM | POA: Diagnosis not present

## 2016-03-21 DIAGNOSIS — N951 Menopausal and female climacteric states: Secondary | ICD-10-CM | POA: Diagnosis not present

## 2016-03-21 DIAGNOSIS — Z0000 Encounter for general adult medical examination without abnormal findings: Secondary | ICD-10-CM | POA: Diagnosis not present

## 2016-03-21 DIAGNOSIS — I10 Essential (primary) hypertension: Secondary | ICD-10-CM | POA: Diagnosis not present

## 2016-03-21 DIAGNOSIS — E559 Vitamin D deficiency, unspecified: Secondary | ICD-10-CM | POA: Diagnosis not present

## 2016-03-21 DIAGNOSIS — E7800 Pure hypercholesterolemia, unspecified: Secondary | ICD-10-CM | POA: Diagnosis not present

## 2016-06-14 DIAGNOSIS — L728 Other follicular cysts of the skin and subcutaneous tissue: Secondary | ICD-10-CM | POA: Diagnosis not present

## 2016-06-14 DIAGNOSIS — I781 Nevus, non-neoplastic: Secondary | ICD-10-CM | POA: Diagnosis not present

## 2016-06-14 DIAGNOSIS — C44519 Basal cell carcinoma of skin of other part of trunk: Secondary | ICD-10-CM | POA: Diagnosis not present

## 2016-06-14 DIAGNOSIS — R208 Other disturbances of skin sensation: Secondary | ICD-10-CM | POA: Diagnosis not present

## 2016-06-21 DIAGNOSIS — E1165 Type 2 diabetes mellitus with hyperglycemia: Secondary | ICD-10-CM | POA: Diagnosis not present

## 2016-06-21 DIAGNOSIS — Z7984 Long term (current) use of oral hypoglycemic drugs: Secondary | ICD-10-CM | POA: Diagnosis not present

## 2016-08-23 DIAGNOSIS — Z85828 Personal history of other malignant neoplasm of skin: Secondary | ICD-10-CM | POA: Diagnosis not present

## 2016-08-23 DIAGNOSIS — Z08 Encounter for follow-up examination after completed treatment for malignant neoplasm: Secondary | ICD-10-CM | POA: Diagnosis not present

## 2016-09-21 DIAGNOSIS — E7800 Pure hypercholesterolemia, unspecified: Secondary | ICD-10-CM | POA: Diagnosis not present

## 2016-09-21 DIAGNOSIS — I10 Essential (primary) hypertension: Secondary | ICD-10-CM | POA: Diagnosis not present

## 2016-09-21 DIAGNOSIS — F411 Generalized anxiety disorder: Secondary | ICD-10-CM | POA: Diagnosis not present

## 2016-09-21 DIAGNOSIS — E1165 Type 2 diabetes mellitus with hyperglycemia: Secondary | ICD-10-CM | POA: Diagnosis not present

## 2016-09-21 DIAGNOSIS — I519 Heart disease, unspecified: Secondary | ICD-10-CM | POA: Diagnosis not present

## 2016-10-26 DIAGNOSIS — E119 Type 2 diabetes mellitus without complications: Secondary | ICD-10-CM | POA: Diagnosis not present

## 2016-11-08 DIAGNOSIS — X32XXXD Exposure to sunlight, subsequent encounter: Secondary | ICD-10-CM | POA: Diagnosis not present

## 2016-11-08 DIAGNOSIS — L570 Actinic keratosis: Secondary | ICD-10-CM | POA: Diagnosis not present

## 2016-11-08 DIAGNOSIS — C44519 Basal cell carcinoma of skin of other part of trunk: Secondary | ICD-10-CM | POA: Diagnosis not present

## 2016-11-08 DIAGNOSIS — Z1283 Encounter for screening for malignant neoplasm of skin: Secondary | ICD-10-CM | POA: Diagnosis not present

## 2016-11-22 ENCOUNTER — Other Ambulatory Visit: Payer: Self-pay | Admitting: Family Medicine

## 2016-11-22 DIAGNOSIS — Z1231 Encounter for screening mammogram for malignant neoplasm of breast: Secondary | ICD-10-CM

## 2016-12-05 ENCOUNTER — Ambulatory Visit
Admission: RE | Admit: 2016-12-05 | Discharge: 2016-12-05 | Disposition: A | Discharge status: Home / Self Care | Payer: Medicare Other | Source: Ambulatory Visit | Attending: Family Medicine | Admitting: Family Medicine

## 2016-12-05 DIAGNOSIS — Z1231 Encounter for screening mammogram for malignant neoplasm of breast: Secondary | ICD-10-CM

## 2016-12-20 DIAGNOSIS — Z23 Encounter for immunization: Secondary | ICD-10-CM | POA: Diagnosis not present

## 2017-02-07 DIAGNOSIS — N200 Calculus of kidney: Secondary | ICD-10-CM | POA: Diagnosis not present

## 2017-03-14 HISTORY — PX: OTHER SURGICAL HISTORY: SHX169

## 2017-03-16 DIAGNOSIS — R0681 Apnea, not elsewhere classified: Secondary | ICD-10-CM | POA: Diagnosis not present

## 2017-03-20 DIAGNOSIS — I10 Essential (primary) hypertension: Secondary | ICD-10-CM | POA: Diagnosis not present

## 2017-04-07 DIAGNOSIS — Z0000 Encounter for general adult medical examination without abnormal findings: Secondary | ICD-10-CM | POA: Diagnosis not present

## 2017-04-07 DIAGNOSIS — I10 Essential (primary) hypertension: Secondary | ICD-10-CM | POA: Diagnosis not present

## 2017-04-07 DIAGNOSIS — I519 Heart disease, unspecified: Secondary | ICD-10-CM | POA: Diagnosis not present

## 2017-04-07 DIAGNOSIS — E119 Type 2 diabetes mellitus without complications: Secondary | ICD-10-CM | POA: Diagnosis not present

## 2017-04-07 DIAGNOSIS — E559 Vitamin D deficiency, unspecified: Secondary | ICD-10-CM | POA: Diagnosis not present

## 2017-04-07 DIAGNOSIS — N3281 Overactive bladder: Secondary | ICD-10-CM | POA: Diagnosis not present

## 2017-04-07 DIAGNOSIS — E7800 Pure hypercholesterolemia, unspecified: Secondary | ICD-10-CM | POA: Diagnosis not present

## 2017-04-07 DIAGNOSIS — Z1159 Encounter for screening for other viral diseases: Secondary | ICD-10-CM | POA: Diagnosis not present

## 2017-04-07 DIAGNOSIS — I517 Cardiomegaly: Secondary | ICD-10-CM | POA: Diagnosis not present

## 2017-04-07 DIAGNOSIS — F411 Generalized anxiety disorder: Secondary | ICD-10-CM | POA: Diagnosis not present

## 2017-04-07 DIAGNOSIS — Z6835 Body mass index (BMI) 35.0-35.9, adult: Secondary | ICD-10-CM | POA: Diagnosis not present

## 2017-04-12 DIAGNOSIS — G4733 Obstructive sleep apnea (adult) (pediatric): Secondary | ICD-10-CM | POA: Diagnosis not present

## 2017-05-12 DIAGNOSIS — H2513 Age-related nuclear cataract, bilateral: Secondary | ICD-10-CM | POA: Diagnosis not present

## 2017-05-12 DIAGNOSIS — H40013 Open angle with borderline findings, low risk, bilateral: Secondary | ICD-10-CM | POA: Diagnosis not present

## 2017-05-23 DIAGNOSIS — M8588 Other specified disorders of bone density and structure, other site: Secondary | ICD-10-CM | POA: Diagnosis not present

## 2017-07-05 DIAGNOSIS — R1084 Generalized abdominal pain: Secondary | ICD-10-CM | POA: Diagnosis not present

## 2017-07-05 DIAGNOSIS — N202 Calculus of kidney with calculus of ureter: Secondary | ICD-10-CM | POA: Diagnosis not present

## 2017-07-10 DIAGNOSIS — E1169 Type 2 diabetes mellitus with other specified complication: Secondary | ICD-10-CM | POA: Diagnosis not present

## 2017-07-10 DIAGNOSIS — G4733 Obstructive sleep apnea (adult) (pediatric): Secondary | ICD-10-CM | POA: Diagnosis not present

## 2017-07-13 DIAGNOSIS — E7800 Pure hypercholesterolemia, unspecified: Secondary | ICD-10-CM | POA: Diagnosis not present

## 2017-07-13 DIAGNOSIS — E1165 Type 2 diabetes mellitus with hyperglycemia: Secondary | ICD-10-CM | POA: Diagnosis not present

## 2017-07-13 DIAGNOSIS — G4733 Obstructive sleep apnea (adult) (pediatric): Secondary | ICD-10-CM | POA: Diagnosis not present

## 2017-07-13 DIAGNOSIS — R809 Proteinuria, unspecified: Secondary | ICD-10-CM | POA: Diagnosis not present

## 2017-07-13 DIAGNOSIS — Z7984 Long term (current) use of oral hypoglycemic drugs: Secondary | ICD-10-CM | POA: Diagnosis not present

## 2017-07-13 DIAGNOSIS — I10 Essential (primary) hypertension: Secondary | ICD-10-CM | POA: Diagnosis not present

## 2017-07-19 DIAGNOSIS — N201 Calculus of ureter: Secondary | ICD-10-CM | POA: Diagnosis not present

## 2017-07-19 DIAGNOSIS — D49511 Neoplasm of unspecified behavior of right kidney: Secondary | ICD-10-CM | POA: Diagnosis not present

## 2017-07-20 ENCOUNTER — Other Ambulatory Visit: Payer: Self-pay | Admitting: Urology

## 2017-07-20 DIAGNOSIS — D4101 Neoplasm of uncertain behavior of right kidney: Secondary | ICD-10-CM

## 2017-07-20 DIAGNOSIS — D49511 Neoplasm of unspecified behavior of right kidney: Secondary | ICD-10-CM

## 2017-07-28 DIAGNOSIS — D126 Benign neoplasm of colon, unspecified: Secondary | ICD-10-CM | POA: Diagnosis not present

## 2017-07-28 DIAGNOSIS — K5730 Diverticulosis of large intestine without perforation or abscess without bleeding: Secondary | ICD-10-CM | POA: Diagnosis not present

## 2017-07-28 DIAGNOSIS — Z1211 Encounter for screening for malignant neoplasm of colon: Secondary | ICD-10-CM | POA: Diagnosis not present

## 2017-07-28 DIAGNOSIS — K640 First degree hemorrhoids: Secondary | ICD-10-CM | POA: Diagnosis not present

## 2017-08-01 ENCOUNTER — Ambulatory Visit
Admission: RE | Admit: 2017-08-01 | Discharge: 2017-08-01 | Disposition: A | Discharge status: Home / Self Care | Payer: Medicare Other | Source: Ambulatory Visit | Attending: Urology | Admitting: Urology

## 2017-08-01 DIAGNOSIS — D4101 Neoplasm of uncertain behavior of right kidney: Secondary | ICD-10-CM

## 2017-08-01 DIAGNOSIS — D126 Benign neoplasm of colon, unspecified: Secondary | ICD-10-CM | POA: Diagnosis not present

## 2017-08-01 DIAGNOSIS — D49511 Neoplasm of unspecified behavior of right kidney: Secondary | ICD-10-CM

## 2017-08-01 DIAGNOSIS — N2889 Other specified disorders of kidney and ureter: Secondary | ICD-10-CM | POA: Diagnosis not present

## 2017-08-01 MED ORDER — GADOBENATE DIMEGLUMINE 529 MG/ML IV SOLN
19.0000 mL | Freq: Once | INTRAVENOUS | Status: AC | PRN
  Administered 2017-08-01: 19 mL via INTRAVENOUS

## 2017-08-08 DIAGNOSIS — N200 Calculus of kidney: Secondary | ICD-10-CM | POA: Diagnosis not present

## 2017-08-08 DIAGNOSIS — N281 Cyst of kidney, acquired: Secondary | ICD-10-CM | POA: Diagnosis not present

## 2017-08-08 DIAGNOSIS — R34 Anuria and oliguria: Secondary | ICD-10-CM | POA: Diagnosis not present

## 2017-08-26 DIAGNOSIS — R040 Epistaxis: Secondary | ICD-10-CM | POA: Diagnosis not present

## 2017-09-22 DIAGNOSIS — N281 Cyst of kidney, acquired: Secondary | ICD-10-CM | POA: Diagnosis not present

## 2017-09-22 DIAGNOSIS — N200 Calculus of kidney: Secondary | ICD-10-CM | POA: Diagnosis not present

## 2017-09-22 DIAGNOSIS — R34 Anuria and oliguria: Secondary | ICD-10-CM | POA: Diagnosis not present

## 2017-09-29 ENCOUNTER — Emergency Department (HOSPITAL_COMMUNITY): Payer: Medicare Other

## 2017-09-29 ENCOUNTER — Other Ambulatory Visit: Payer: Self-pay

## 2017-09-29 ENCOUNTER — Inpatient Hospital Stay (HOSPITAL_COMMUNITY)
Admission: EM | Admit: 2017-09-29 | Discharge: 2017-10-01 | DRG: 066 | Disposition: A | Discharge status: Home / Self Care | Payer: Medicare Other | Attending: Internal Medicine | Admitting: Internal Medicine

## 2017-09-29 ENCOUNTER — Encounter (HOSPITAL_COMMUNITY): Payer: Self-pay | Admitting: Emergency Medicine

## 2017-09-29 DIAGNOSIS — R0981 Nasal congestion: Secondary | ICD-10-CM | POA: Diagnosis present

## 2017-09-29 DIAGNOSIS — R531 Weakness: Secondary | ICD-10-CM | POA: Diagnosis not present

## 2017-09-29 DIAGNOSIS — K580 Irritable bowel syndrome with diarrhea: Secondary | ICD-10-CM | POA: Diagnosis present

## 2017-09-29 DIAGNOSIS — Z803 Family history of malignant neoplasm of breast: Secondary | ICD-10-CM

## 2017-09-29 DIAGNOSIS — G459 Transient cerebral ischemic attack, unspecified: Secondary | ICD-10-CM

## 2017-09-29 DIAGNOSIS — I6622 Occlusion and stenosis of left posterior cerebral artery: Secondary | ICD-10-CM | POA: Diagnosis not present

## 2017-09-29 DIAGNOSIS — F419 Anxiety disorder, unspecified: Secondary | ICD-10-CM | POA: Diagnosis not present

## 2017-09-29 DIAGNOSIS — R4701 Aphasia: Secondary | ICD-10-CM | POA: Diagnosis not present

## 2017-09-29 DIAGNOSIS — Z6835 Body mass index (BMI) 35.0-35.9, adult: Secondary | ICD-10-CM

## 2017-09-29 DIAGNOSIS — I639 Cerebral infarction, unspecified: Secondary | ICD-10-CM | POA: Diagnosis not present

## 2017-09-29 DIAGNOSIS — I6349 Cerebral infarction due to embolism of other cerebral artery: Secondary | ICD-10-CM | POA: Diagnosis not present

## 2017-09-29 DIAGNOSIS — Z7982 Long term (current) use of aspirin: Secondary | ICD-10-CM

## 2017-09-29 DIAGNOSIS — J45909 Unspecified asthma, uncomplicated: Secondary | ICD-10-CM | POA: Diagnosis present

## 2017-09-29 DIAGNOSIS — Z90710 Acquired absence of both cervix and uterus: Secondary | ICD-10-CM

## 2017-09-29 DIAGNOSIS — E1169 Type 2 diabetes mellitus with other specified complication: Secondary | ICD-10-CM

## 2017-09-29 DIAGNOSIS — R1310 Dysphagia, unspecified: Secondary | ICD-10-CM

## 2017-09-29 DIAGNOSIS — R4789 Other speech disturbances: Secondary | ICD-10-CM | POA: Diagnosis not present

## 2017-09-29 DIAGNOSIS — E119 Type 2 diabetes mellitus without complications: Secondary | ICD-10-CM | POA: Diagnosis present

## 2017-09-29 DIAGNOSIS — Z90722 Acquired absence of ovaries, bilateral: Secondary | ICD-10-CM

## 2017-09-29 DIAGNOSIS — Z8701 Personal history of pneumonia (recurrent): Secondary | ICD-10-CM

## 2017-09-29 DIAGNOSIS — R2990 Unspecified symptoms and signs involving the nervous system: Secondary | ICD-10-CM

## 2017-09-29 DIAGNOSIS — Z882 Allergy status to sulfonamides status: Secondary | ICD-10-CM

## 2017-09-29 DIAGNOSIS — Z9049 Acquired absence of other specified parts of digestive tract: Secondary | ICD-10-CM

## 2017-09-29 DIAGNOSIS — E669 Obesity, unspecified: Secondary | ICD-10-CM | POA: Diagnosis not present

## 2017-09-29 DIAGNOSIS — Z79899 Other long term (current) drug therapy: Secondary | ICD-10-CM

## 2017-09-29 DIAGNOSIS — Z888 Allergy status to other drugs, medicaments and biological substances status: Secondary | ICD-10-CM

## 2017-09-29 DIAGNOSIS — G4733 Obstructive sleep apnea (adult) (pediatric): Secondary | ICD-10-CM

## 2017-09-29 DIAGNOSIS — R29701 NIHSS score 1: Secondary | ICD-10-CM | POA: Diagnosis present

## 2017-09-29 DIAGNOSIS — I10 Essential (primary) hypertension: Secondary | ICD-10-CM | POA: Diagnosis not present

## 2017-09-29 DIAGNOSIS — R29810 Facial weakness: Secondary | ICD-10-CM | POA: Diagnosis not present

## 2017-09-29 DIAGNOSIS — R002 Palpitations: Secondary | ICD-10-CM | POA: Diagnosis present

## 2017-09-29 DIAGNOSIS — Z9989 Dependence on other enabling machines and devices: Secondary | ICD-10-CM

## 2017-09-29 DIAGNOSIS — Z8249 Family history of ischemic heart disease and other diseases of the circulatory system: Secondary | ICD-10-CM

## 2017-09-29 DIAGNOSIS — Z881 Allergy status to other antibiotic agents status: Secondary | ICD-10-CM

## 2017-09-29 DIAGNOSIS — R040 Epistaxis: Secondary | ICD-10-CM | POA: Diagnosis not present

## 2017-09-29 DIAGNOSIS — E785 Hyperlipidemia, unspecified: Secondary | ICD-10-CM | POA: Diagnosis present

## 2017-09-29 DIAGNOSIS — R000 Tachycardia, unspecified: Secondary | ICD-10-CM | POA: Diagnosis not present

## 2017-09-29 DIAGNOSIS — Z91048 Other nonmedicinal substance allergy status: Secondary | ICD-10-CM

## 2017-09-29 DIAGNOSIS — Z7984 Long term (current) use of oral hypoglycemic drugs: Secondary | ICD-10-CM

## 2017-09-29 DIAGNOSIS — Z87442 Personal history of urinary calculi: Secondary | ICD-10-CM

## 2017-09-29 LAB — DIFFERENTIAL
Abs Immature Granulocytes: 0 10*3/uL (ref 0.0–0.1)
BASOS ABS: 0.1 10*3/uL (ref 0.0–0.1)
BASOS PCT: 1 %
EOS ABS: 0.7 10*3/uL (ref 0.0–0.7)
Eosinophils Relative: 7 %
Immature Granulocytes: 0 %
Lymphocytes Relative: 17 %
Lymphs Abs: 1.7 10*3/uL (ref 0.7–4.0)
MONO ABS: 0.7 10*3/uL (ref 0.1–1.0)
MONOS PCT: 6 %
NEUTROS PCT: 69 %
Neutro Abs: 7.2 10*3/uL (ref 1.7–7.7)

## 2017-09-29 LAB — TSH: TSH: 1.502 u[IU]/mL (ref 0.350–4.500)

## 2017-09-29 LAB — PROTIME-INR
INR: 0.99
PROTHROMBIN TIME: 13 s (ref 11.4–15.2)

## 2017-09-29 LAB — CBC
HCT: 42.2 % (ref 36.0–46.0)
Hemoglobin: 13.3 g/dL (ref 12.0–15.0)
MCH: 28.3 pg (ref 26.0–34.0)
MCHC: 31.5 g/dL (ref 30.0–36.0)
MCV: 89.8 fL (ref 78.0–100.0)
PLATELETS: 231 10*3/uL (ref 150–400)
RBC: 4.7 MIL/uL (ref 3.87–5.11)
RDW: 14 % (ref 11.5–15.5)
WBC: 10.5 10*3/uL (ref 4.0–10.5)

## 2017-09-29 LAB — I-STAT CHEM 8, ED
BUN: 11 mg/dL (ref 8–23)
CALCIUM ION: 1.16 mmol/L (ref 1.15–1.40)
CHLORIDE: 106 mmol/L (ref 98–111)
CREATININE: 0.6 mg/dL (ref 0.44–1.00)
Glucose, Bld: 141 mg/dL — ABNORMAL HIGH (ref 70–99)
HCT: 42 % (ref 36.0–46.0)
Hemoglobin: 14.3 g/dL (ref 12.0–15.0)
Potassium: 4.5 mmol/L (ref 3.5–5.1)
Sodium: 138 mmol/L (ref 135–145)
TCO2: 23 mmol/L (ref 22–32)

## 2017-09-29 LAB — COMPREHENSIVE METABOLIC PANEL
ALT: 22 U/L (ref 0–44)
ANION GAP: 10 (ref 5–15)
AST: 23 U/L (ref 15–41)
Albumin: 4.2 g/dL (ref 3.5–5.0)
Alkaline Phosphatase: 101 U/L (ref 38–126)
BUN: 10 mg/dL (ref 8–23)
CALCIUM: 10.1 mg/dL (ref 8.9–10.3)
CO2: 25 mmol/L (ref 22–32)
CREATININE: 0.72 mg/dL (ref 0.44–1.00)
Chloride: 104 mmol/L (ref 98–111)
GLUCOSE: 144 mg/dL — AB (ref 70–99)
POTASSIUM: 4.6 mmol/L (ref 3.5–5.1)
SODIUM: 139 mmol/L (ref 135–145)
TOTAL PROTEIN: 7.5 g/dL (ref 6.5–8.1)
Total Bilirubin: 0.5 mg/dL (ref 0.3–1.2)

## 2017-09-29 LAB — I-STAT TROPONIN, ED: Troponin i, poc: 0 ng/mL (ref 0.00–0.08)

## 2017-09-29 LAB — GLUCOSE, CAPILLARY: GLUCOSE-CAPILLARY: 132 mg/dL — AB (ref 70–99)

## 2017-09-29 LAB — APTT: APTT: 32 s (ref 24–36)

## 2017-09-29 MED ORDER — ACETAMINOPHEN 325 MG PO TABS: 650.0000 mg | ORAL_TABLET | ORAL | Status: DC | PRN

## 2017-09-29 MED ORDER — ATORVASTATIN CALCIUM 80 MG PO TABS
80.0000 mg | ORAL_TABLET | Freq: Every day | ORAL | Status: DC
  Administered 2017-09-30: 80 mg via ORAL
  Filled 2017-09-29: qty 1

## 2017-09-29 MED ORDER — ASPIRIN 81 MG PO CHEW
324.0000 mg | CHEWABLE_TABLET | Freq: Once | ORAL | Status: AC
  Administered 2017-09-29: 324 mg via ORAL
  Filled 2017-09-29: qty 4

## 2017-09-29 MED ORDER — ACETAMINOPHEN 160 MG/5ML PO SOLN: 650.0000 mg | ORAL | Status: DC | PRN

## 2017-09-29 MED ORDER — STROKE: EARLY STAGES OF RECOVERY BOOK
Freq: Once | Status: AC
  Administered 2017-09-29: 21:00:00
  Filled 2017-09-29: qty 1

## 2017-09-29 MED ORDER — HYDRALAZINE HCL 20 MG/ML IJ SOLN: 10.0000 mg | INTRAMUSCULAR | Status: DC | PRN

## 2017-09-29 MED ORDER — ACETAMINOPHEN 650 MG RE SUPP: 650.0000 mg | RECTAL | Status: DC | PRN

## 2017-09-29 MED ORDER — INSULIN ASPART 100 UNIT/ML ~~LOC~~ SOLN
0.0000 [IU] | Freq: Three times a day (TID) | SUBCUTANEOUS | Status: DC
  Administered 2017-09-30 – 2017-10-01 (×3): 2 [IU] via SUBCUTANEOUS
  Administered 2017-10-01: 1 [IU] via SUBCUTANEOUS

## 2017-09-29 MED ORDER — IOPAMIDOL (ISOVUE-370) INJECTION 76%
INTRAVENOUS | Status: AC
  Filled 2017-09-29: qty 50

## 2017-09-29 MED ORDER — LORAZEPAM 0.5 MG PO TABS
0.5000 mg | ORAL_TABLET | Freq: Two times a day (BID) | ORAL | Status: DC | PRN
Start: 2017-09-29 — End: 2017-10-01
  Administered 2017-09-30 – 2017-10-01 (×3): 0.5 mg via ORAL
  Filled 2017-09-29 (×3): qty 1

## 2017-09-29 MED ORDER — ENOXAPARIN SODIUM 40 MG/0.4ML ~~LOC~~ SOLN
40.0000 mg | SUBCUTANEOUS | Status: DC
  Administered 2017-09-29: 40 mg via SUBCUTANEOUS
  Filled 2017-09-29: qty 0.4

## 2017-09-29 MED ORDER — IOPAMIDOL (ISOVUE-370) INJECTION 76%
50.0000 mL | Freq: Once | INTRAVENOUS | Status: AC | PRN
  Administered 2017-09-29: 50 mL via INTRAVENOUS

## 2017-09-29 MED ORDER — INSULIN ASPART 100 UNIT/ML ~~LOC~~ SOLN: 0.0000 [IU] | Freq: Every day | SUBCUTANEOUS | Status: DC

## 2017-09-29 MED ORDER — OXYBUTYNIN CHLORIDE ER 5 MG PO TB24
5.0000 mg | ORAL_TABLET | Freq: Every day | ORAL | Status: DC
  Administered 2017-09-30 – 2017-10-01 (×2): 5 mg via ORAL
  Filled 2017-09-29 (×2): qty 1

## 2017-09-29 NOTE — ED Notes (Signed)
Pt taken to CT.

## 2017-09-29 NOTE — H&P (Signed)
History and Physical    Christine Navarro:[id number] DOB: 25-Nov-1950 DOA: 09/29/2017  Referring MD/NP/PA: Donna Bernard, MD (resident) PCP: Mayra Neer, MD  Patient coming from: Home   Chief Complaint: Didn't feel well  I have personally briefly reviewed patient's old medical records in [place]   HPI: Christine Navarro is a 67 y.o. right hand dominate female with medical history significant of HTN, HLD, DM type II, OSA on CPAP, anxiety; who woke up around 7:30 a.m. not feeling well.  Last noted to be normal prior to going to bed around 10 PM last night.  She reported feeling as though the left side of her face was drawing up and her speech was slurred.  While trying to ambulate patient reported feeling as though her legs were heavy and weak most noticeably on the left side.  Associated symptoms included complaints of feeling off balance, sinus congestion, and difficulty word finding.  More chronic complaints include intermittent palpitations and diarrhea related to irritable bowel syndromes.  Denies any significant fever, chills, nausea, vomiting, chest pain, shortness of breath, cough, easy bruising, history of stroke, or heart disease.  ED Course: Patient presented was evaluated by neurology but was out of the window for TPA stroke code was not activated.  CT scan of the brain showed no acute abnormalities.  Upon admission into the emergency department patient was noted to be afebrile, blood pressure 157/62-211/98, pulse 75-1 03, respiration 14-22, and O2 saturation maintained on room air.  Labs revealed relatively normal CBC, CMP, PT/INR, and troponin. CT angiogram of the head and neck did not show any acute signs of stroke. Dr. Rory Percy of neurology evaluated the patient.  Patient was given 324 mg of aspirin. TRH called to admit for completion of stroke work-up.  Review of Systems  Constitutional: Positive for malaise/fatigue. Negative for chills, fever and weight loss.  HENT:  Positive for congestion.   Eyes: Negative for double vision and photophobia.  Respiratory: Negative for cough and shortness of breath.   Cardiovascular: Positive for palpitations. Negative for chest pain and leg swelling.  Gastrointestinal: Positive for diarrhea. Negative for abdominal pain, nausea and vomiting.  Genitourinary: Negative for dysuria and frequency.  Musculoskeletal: Negative for joint pain and myalgias.  Skin: Negative for rash.  Neurological: Positive for speech change and focal weakness. Negative for loss of consciousness.  Psychiatric/Behavioral: Negative for suicidal ideas. The patient is nervous/anxious.     Past Medical History:  Diagnosis Date  . Anginal pain ([place])    evaluated by Dr Wynonia Lawman, records on chart   . Anxiety   . Arthritis   . Asthma   . Basal cell adenocarcinoma    nose  . Diabetes mellitus without complication ([place])   . History of kidney stones   . Hypertension   . Pneumonia   . PONV (postoperative nausea and vomiting)   . Sleep apnea    mild per pt-  no CPAP    Past Surgical History:  Procedure Laterality Date  . ABDOMINAL HYSTERECTOMY    . APPENDECTOMY    . BREAST BIOPSY    . BREAST EXCISIONAL BIOPSY    . BREAST SURGERY     x 3  . CHOLECYSTECTOMY    . CYSTOSCOPY WITH RETROGRADE PYELOGRAM, URETEROSCOPY AND STENT PLACEMENT Right 12/23/2015   Procedure: CYSTOSCOPY WITH RETROGRADE PYELOGRAM, URETEROSCOPY AND STENT PLACEMENT;  Surgeon: Alexis Frock, MD;  Location: [facility];  Service: Urology;  Laterality: Right;  . DIAGNOSTIC LAPAROSCOPY    .  HOLMIUM LASER APPLICATION Right 85/63/1497   Procedure: HOLMIUM LASER APPLICATION;  Surgeon: Alexis Frock, MD;  Location: [facility];  Service: Urology;  Laterality: Right;  . salpingectomy with oophorectomy       reports that she has never smoked. She has never used smokeless tobacco. She reports that she does not drink alcohol or use drugs.  Allergies  Allergen Reactions  . Ace Inhibitors Cough    . Ciprofloxacin Other (See Comments)    Muscle Cramps  . Sular [Nisoldipine Er] Other (See Comments)    HEART RACING  . Vitamin D Analogs Nausea And Vomiting  . Welchol [Colesevelam Hcl] Nausea And Vomiting  . Erythromycin Nausea And Vomiting  . Sulfa Antibiotics Rash  . Tape Rash    Use paper tape please    Family History  Problem Relation Age of Onset  . Cancer Mother   . Breast cancer Mother   . Heart disease Father     Prior to Admission medications   Medication Sig Start Date End Date Taking? Authorizing Provider  aspirin EC 81 MG tablet Take 81 mg by mouth daily.   Yes [provider]  LORazepam (ATIVAN) 1 MG tablet Take 0.5 mg by mouth 2 (two) times daily as needed for anxiety or sleep.    Yes [provider]  metFORMIN (GLUCOPHAGE) 500 MG tablet Take 500 mg by mouth 2 (two) times daily. 07/20/17  Yes [provider]  metoprolol tartrate (LOPRESSOR) 25 MG tablet Take 25 mg by mouth 2 (two) times daily.   Yes [provider]  oxybutynin (DITROPAN-XL) 5 MG 24 hr tablet Take 5 mg by mouth daily. 08/30/17  Yes [provider]  rosuvastatin (CRESTOR) 40 MG tablet Take 20 mg by mouth at bedtime.    Yes [provider]  oxyCODONE-acetaminophen (PERCOCET/ROXICET) 5-325 MG tablet Take 1-2 tablets by mouth every 4 (four) hours as needed for severe pain. Patient not taking: Reported on 09/29/2017 12/23/15   Alexis Frock, MD  senna-docusate (SENOKOT-S) 8.6-50 MG tablet Take 1 tablet by mouth 2 (two) times daily. While taking pain meds to prevent constipation. Patient not taking: Reported on 09/29/2017 12/23/15   Alexis Frock, MD    Physical Exam:  Constitutional: Obese female in NAD, calm, comfortable Vitals:   09/29/17 1815 09/29/17 1838 09/29/17 1845 09/29/17 1900  BP: (!) 166/70  (!) 167/73 (!) 166/76  Pulse: 83  79 75  Resp: 19  19 14   Temp:  98.8 F (37.1 C)    TempSrc:      SpO2: 94%  96% 95%  Weight:      Height:        Eyes: PERRL, lids and conjunctivae normal ENMT: Mucous membranes are moist. Posterior pharynx clear of any exudate or lesions.  Neck: normal, supple, no masses, no thyromegaly Respiratory: clear to auscultation bilaterally, no wheezing, no crackles. Normal respiratory effort. No accessory muscle use.  Cardiovascular: Regular rate and rhythm, no murmurs / rubs / gallops. No extremity edema. 2+ pedal pulses. No carotid bruits.  Abdomen: no tenderness, no masses palpated. No hepatosplenomegaly. Bowel sounds positive.  Musculoskeletal: no clubbing / cyanosis. No joint deformity upper and lower extremities. Good ROM, no contractures. Normal muscle tone.  Skin: no rashes, lesions, ulcers. No induration Neurologic: CN 2-12 grossly intact. Sensation intact, DTR normal.  Mild left sided facial droop.  Strength 5/5 right upper and lower extremity and 4+/5 on left upper lower extremity. Psychiatric: Normal judgment and insight. Alert and oriented x 3.  Normal mood.     Labs on Admission: I have personally reviewed following labs and imaging studies  CBC: Recent Labs  Lab 09/29/17 1700 09/29/17 1712  WBC 10.5  --   NEUTROABS 7.2  --   HGB 13.3 14.3  HCT 42.2 42.0  MCV 89.8  --   PLT 231  --    Basic Metabolic Panel: Recent Labs  Lab 09/29/17 1700 09/29/17 1712  NA 139 138  K 4.6 4.5  CL 104 106  CO2 25  --   GLUCOSE 144* 141*  BUN 10 11  CREATININE 0.72 0.60  CALCIUM 10.1  --    GFR: Estimated Creatinine Clearance: 69.6 mL/min (by C-G formula based on SCr of 0.6 mg/dL). Liver Function Tests: Recent Labs  Lab 09/29/17 1700  AST 23  ALT 22  ALKPHOS 101  BILITOT 0.5  PROT 7.5  ALBUMIN 4.2   No results for input(s): LIPASE, AMYLASE in the last 168 hours. No results for input(s): AMMONIA in the last 168 hours. Coagulation Profile: Recent Labs  Lab 09/29/17 1700  INR 0.99   Cardiac Enzymes: No results for input(s): CKTOTAL, CKMB, CKMBINDEX, TROPONINI in the last  168 hours. BNP (last 3 results) No results for input(s): PROBNP in the last 8760 hours. HbA1C: No results for input(s): HGBA1C in the last 72 hours. CBG: No results for input(s): GLUCAP in the last 168 hours. Lipid Profile: No results for input(s): CHOL, HDL, LDLCALC, TRIG, CHOLHDL, LDLDIRECT in the last 72 hours. Thyroid Function Tests: No results for input(s): TSH, T4TOTAL, FREET4, T3FREE, THYROIDAB in the last 72 hours. Anemia Panel: No results for input(s): VITAMINB12, FOLATE, FERRITIN, TIBC, IRON, RETICCTPCT in the last 72 hours. Urine analysis:    Component Value Date/Time   COLORURINE YELLOW 03/05/2007 1115   APPEARANCEUR HAZY (A) 03/05/2007 1115   LABSPEC 1.022 03/05/2007 1115   PHURINE 5.5 03/05/2007 1115   GLUCOSEU NEGATIVE 03/05/2007 1115   HGBUR NEGATIVE 03/05/2007 1115   BILIRUBINUR neg 11/21/2014 1154   KETONESUR NEGATIVE 03/05/2007 1115   PROTEINUR neg 11/21/2014 1154   PROTEINUR NEGATIVE 03/05/2007 1115   UROBILINOGEN 0.2 11/21/2014 1154   UROBILINOGEN 0.2 03/05/2007 1115   NITRITE neg 11/21/2014 1154   NITRITE NEGATIVE 03/05/2007 1115   LEUKOCYTESUR Negative 11/21/2014 1154   Sepsis Labs: No results found for this or any previous visit (from the past 240 hour(s)).   Radiological Exams on Admission: Ct Angio Head W Or Wo Contrast  Result Date: 09/29/2017 CLINICAL DATA:  Transient speech difficulties this morning. Dysphagia and leg heaviness. Suspect stroke. History of hypertension, diabetes. EXAM: CT ANGIOGRAPHY HEAD AND NECK TECHNIQUE: Multidetector CT imaging of the head and neck was performed using the standard protocol during bolus administration of intravenous contrast. Multiplanar CT image reconstructions and MIPs were obtained to evaluate the vascular anatomy. Carotid stenosis measurements (when applicable) are obtained utilizing NASCET criteria, using the distal internal carotid diameter as the denominator. CONTRAST:  90mL ISOVUE-370 IOPAMIDOL  (ISOVUE-370) INJECTION 76% COMPARISON:  CT neck January 09, 2015. FINDINGS: CT HEAD FINDINGS BRAIN: No intraparenchymal hemorrhage, mass effect nor midline shift. The ventricles and sulci are normal for age. No acute large vascular territory infarcts. No abnormal extra-axial fluid collections. Basal cisterns are patent. Punctate dural calcification anterior falx. VASCULAR: Mild calcific atherosclerosis of the carotid siphons. SKULL: No skull fracture. No significant scalp soft tissue swelling. SINUSES/ORBITS: The mastoid air-cells and included paranasal sinuses are well-aerated.The included ocular globes and orbital contents are non-suspicious. OTHER: None. CTA NECK  FINDINGS: AORTIC ARCH: Normal appearance of the thoracic arch, normal branch pattern. Mild calcific atherosclerosis aortic arch. The origins of the innominate, left Common carotid artery and subclavian artery are widely patent. RIGHT CAROTID SYSTEM: Common carotid artery is patent. Retropharyngeal carotid bifurcation without hemodynamically significant stenosis by NASCET criteria. Normal appearance of the internal carotid artery. LEFT CAROTID SYSTEM: Common carotid artery is patent. Normal appearance of the carotid bifurcation without hemodynamically significant stenosis by NASCET criteria. Normal appearance of the internal carotid artery. VERTEBRAL ARTERIES:Left vertebral artery is dominant. Normal appearance of the vertebral arteries, widely patent. SKELETON: No acute osseous process though bone windows have not been submitted. Coarse calcific enthesopathy longus coli insertion. Moderate to severe C4-5, moderate C5-6 and C6-7 degenerative discs. OTHER NECK: Soft tissues of the neck are nonacute though, not tailored for evaluation. New 12 mm hypodense LEFT thyroid nodule. No routine indicated follow-up; this follows ACR consensus guidelines: Managing Incidental Thyroid Nodules Detected on Imaging: White Paper of the ACR Incidental Thyroid Findings  Committee. J Am Coll Radiol 2015; 12:143-150. UPPER CHEST: Included lung apices are clear. No superior mediastinal lymphadenopathy. CTA HEAD FINDINGS: ANTERIOR CIRCULATION: Patent cervical internal carotid arteries, petrous, cavernous and supra clinoid internal carotid arteries. Patent anterior communicating artery. Patent anterior and middle cerebral arteries, mild luminal irregularity compatible with atherosclerosis. No large vessel occlusion, significant stenosis, contrast extravasation or aneurysm. POSTERIOR CIRCULATION: Patent vertebral arteries, vertebrobasilar junction and basilar artery, as well as main branch vessels. Patent posterior cerebral arteries. LEFT PCOM insertional infundibulum. Moderate tandem stenosis LEFT posterior cerebral artery. No large vessel occlusion, contrast extravasation or aneurysm. VENOUS SINUSES: Major dural venous sinuses are patent though not tailored for evaluation on this angiographic examination. ANATOMIC VARIANTS: None. DELAYED PHASE: No abnormal intracranial enhancement. MIP images reviewed. IMPRESSION: CT HEAD: 1. Normal CT HEAD with without contrast for age. CTA NECK: 1. No hemodynamically significant stenosis ICA. 2. Patent vertebral arteries. CTA HEAD: 1. No emergent large vessel occlusion or flow-limiting stenosis. 2. Moderate stenosis LEFT PCA most compatible with atherosclerosis. Aortic Atherosclerosis (ICD10-I70.0). Electronically Signed   By: Elon Alas M.D.   On: 09/29/2017 18:50   Ct Angio Neck W Or Wo Contrast  Result Date: 09/29/2017 CLINICAL DATA:  Transient speech difficulties this morning. Dysphagia and leg heaviness. Suspect stroke. History of hypertension, diabetes. EXAM: CT ANGIOGRAPHY HEAD AND NECK TECHNIQUE: Multidetector CT imaging of the head and neck was performed using the standard protocol during bolus administration of intravenous contrast. Multiplanar CT image reconstructions and MIPs were obtained to evaluate the vascular anatomy.  Carotid stenosis measurements (when applicable) are obtained utilizing NASCET criteria, using the distal internal carotid diameter as the denominator. CONTRAST:  40mL ISOVUE-370 IOPAMIDOL (ISOVUE-370) INJECTION 76% COMPARISON:  CT neck January 09, 2015. FINDINGS: CT HEAD FINDINGS BRAIN: No intraparenchymal hemorrhage, mass effect nor midline shift. The ventricles and sulci are normal for age. No acute large vascular territory infarcts. No abnormal extra-axial fluid collections. Basal cisterns are patent. Punctate dural calcification anterior falx. VASCULAR: Mild calcific atherosclerosis of the carotid siphons. SKULL: No skull fracture. No significant scalp soft tissue swelling. SINUSES/ORBITS: The mastoid air-cells and included paranasal sinuses are well-aerated.The included ocular globes and orbital contents are non-suspicious. OTHER: None. CTA NECK FINDINGS: AORTIC ARCH: Normal appearance of the thoracic arch, normal branch pattern. Mild calcific atherosclerosis aortic arch. The origins of the innominate, left Common carotid artery and subclavian artery are widely patent. RIGHT CAROTID SYSTEM: Common carotid artery is patent. Retropharyngeal carotid bifurcation without hemodynamically significant stenosis by NASCET  criteria. Normal appearance of the internal carotid artery. LEFT CAROTID SYSTEM: Common carotid artery is patent. Normal appearance of the carotid bifurcation without hemodynamically significant stenosis by NASCET criteria. Normal appearance of the internal carotid artery. VERTEBRAL ARTERIES:Left vertebral artery is dominant. Normal appearance of the vertebral arteries, widely patent. SKELETON: No acute osseous process though bone windows have not been submitted. Coarse calcific enthesopathy longus coli insertion. Moderate to severe C4-5, moderate C5-6 and C6-7 degenerative discs. OTHER NECK: Soft tissues of the neck are nonacute though, not tailored for evaluation. New 12 mm hypodense LEFT thyroid  nodule. No routine indicated follow-up; this follows ACR consensus guidelines: Managing Incidental Thyroid Nodules Detected on Imaging: White Paper of the ACR Incidental Thyroid Findings Committee. J Am Coll Radiol 2015; 12:143-150. UPPER CHEST: Included lung apices are clear. No superior mediastinal lymphadenopathy. CTA HEAD FINDINGS: ANTERIOR CIRCULATION: Patent cervical internal carotid arteries, petrous, cavernous and supra clinoid internal carotid arteries. Patent anterior communicating artery. Patent anterior and middle cerebral arteries, mild luminal irregularity compatible with atherosclerosis. No large vessel occlusion, significant stenosis, contrast extravasation or aneurysm. POSTERIOR CIRCULATION: Patent vertebral arteries, vertebrobasilar junction and basilar artery, as well as main branch vessels. Patent posterior cerebral arteries. LEFT PCOM insertional infundibulum. Moderate tandem stenosis LEFT posterior cerebral artery. No large vessel occlusion, contrast extravasation or aneurysm. VENOUS SINUSES: Major dural venous sinuses are patent though not tailored for evaluation on this angiographic examination. ANATOMIC VARIANTS: None. DELAYED PHASE: No abnormal intracranial enhancement. MIP images reviewed. IMPRESSION: CT HEAD: 1. Normal CT HEAD with without contrast for age. CTA NECK: 1. No hemodynamically significant stenosis ICA. 2. Patent vertebral arteries. CTA HEAD: 1. No emergent large vessel occlusion or flow-limiting stenosis. 2. Moderate stenosis LEFT PCA most compatible with atherosclerosis. Aortic Atherosclerosis (ICD10-I70.0). Electronically Signed   By: Elon Alas M.D.   On: 09/29/2017 18:50    EKG: Independently reviewed.  Sinus tachycardia 109 bpm  Assessment/Plan Dysphagia and left-sided weakness 2/2 Suspected CVA : Acute.  Patient presents with mild weakness on the left side, difficulty word finding, mild left-sided facial droop.  Imaging studies no acute large vessel  occlusion, but moderate left PCA stenosis. - Admit to telemetry bed - Stroke order set initiated - Neuro checks - Check  MRI brain w/o contrast  - PT/OT/Speech to eval and treat - Check echocardiogram - Check Hemoglobin A1c and lipid panel in a.m. - ASA 325 mg  - Atorvastatin 80 mg PO daily - Follow-up telemetry overnight for complaints of palpitations  - add on TSH - Appreciate neurology consultative services, will follow-up   Essential hypertension: Initial blood pressures elevated up to 211/98. - Allow for permissive hypertension 24-48 hrs - Hydralazine IV prn sBP > 220 - Held metoprolol restart when medically appropriate  Diabetes mellitus type 2: Patient reports last hemoglobin A1c noted to be around six-point something.  Patient only on oral medications of metformin - Hypoglycemic protocol - Hold metformin - Follow-up hemoglobin A1c - CBGs q. before meals and at bedtime with sensitive SSI   Anxiety - Ativan prn anxiety  Hyperlipidemia - Atorvastatin 80 mg daily  Obstructive sleep apnea: Patient on CPAP. - CPAP per RT  Obesity: BMI 35.67  DVT prophylaxis: Lovenox  Code Status: Full Family Communication: Discussed plan of care with the patient and family present at bedside Disposition Plan: TBD Consults called: Neurology Admission status: observation  Norval Morton MD [facility] Pager [phone]   If 7PM-7AM, please contact night-coverage [email or web address] Password [facility]  09/29/2017, 7:22 PM

## 2017-09-29 NOTE — Consult Note (Signed)
Neurology Consultation  Reason for Consult: Stroke/TIA  Referring Physician: Dr. Sherry Ruffing  CC: Word finding difficulty, weakness. facial droop  History is obtained from: Patient, chart, patient's daughter at bedside  HPI: Christine Navarro is a 67 y.o. female who has a past medical history of diabetes, hypertension, hyperlipidemia, sleep apnea and anxiety, who was in her usual state of health when she went to bed at 10 PM last night.  She woke up this morning at 7:30 AM and describes that she felt "not right".  She describes this as feeling of generalized malaise as well as difficulty with word finding.  She also felt that she had difficulty walking and felt that both her legs were weaker than usual.  Her daughter noticed that her speech was not normal and she was slow in responding to questions.  Patient also reported difficulty in swallowing which has since resolved.  She was noted to have mild left facial droop and mild left-sided weakness on initial exam in the ED.  She was outside the window for IV TPA hence code stroke was not activated.  She had no cortical signs for which no IR code stroke was paged. She reports being suffering with sinus congestion for the past few days.  Denies fevers or chills.  Denies chest pain shortness of breath nausea vomiting.  Denies easy bleeding bruising. Never had stroke in the past.  Does not have a history of heart disease. Is a non-smoker. Family history of MI in siblings.   LKW: 10 PM 09/28/2017 tpa given?: no, outside window Premorbid modified Rankin scale (mRS): 0  ROS:ROS was performed and is negative except as noted in the HPI.    Past Medical History:  Diagnosis Date  . Anginal pain ([place])    evaluated by Dr Wynonia Lawman, records on chart   . Anxiety   . Arthritis   . Asthma   . Basal cell adenocarcinoma    nose  . Diabetes mellitus without complication ([place])   . History of kidney stones   . Hypertension   . Pneumonia   . PONV (postoperative  nausea and vomiting)   . Sleep apnea    mild per pt-  no CPAP    Family History  Problem Relation Age of Onset  . Cancer Mother   . Breast cancer Mother   . Heart disease Father    Social History:   reports that she has never smoked. She has never used smokeless tobacco. She reports that she does not drink alcohol or use drugs.  Medications No current facility-administered medications for this encounter.   Current Outpatient Medications:  .  aspirin EC 81 MG tablet, Take 81 mg by mouth daily., Disp: , Rfl:  .  estrogens, conjugated, (PREMARIN) 0.625 MG tablet, Take 0.625 mg by mouth 2 (two) times a week. Sunday and Thursday., Disp: , Rfl:  .  irbesartan (AVAPRO) 150 MG tablet, Take 150 mg by mouth at bedtime. , Disp: , Rfl:  .  ketorolac (TORADOL) 10 MG tablet, Take 10 mg by mouth 4 (four) times daily as needed. For pain., Disp: , Rfl:  .  LORazepam (ATIVAN) 1 MG tablet, Take 0.5 mg by mouth 2 (two) times daily as needed for anxiety or sleep. , Disp: , Rfl:  .  metFORMIN (GLUCOPHAGE) 500 MG tablet, Take 500 mg by mouth 2 (two) times daily., Disp: , Rfl:  .  metoprolol tartrate (LOPRESSOR) 25 MG tablet, Take 25 mg by mouth 2 (two) times daily., Disp: ,  Rfl:  .  oxybutynin (DITROPAN-XL) 5 MG 24 hr tablet, Take 5 mg by mouth daily., Disp: , Rfl:  .  oxyCODONE-acetaminophen (PERCOCET/ROXICET) 5-325 MG tablet, Take 1-2 tablets by mouth every 4 (four) hours as needed for severe pain., Disp: 30 tablet, Rfl: 0 .  oxymetazoline (AFRIN) 0.05 % nasal spray, Place 1 spray into both nostrils 2 (two) times daily as needed for congestion., Disp: , Rfl:  .  promethazine (PHENERGAN) 25 MG tablet, Take 25 mg by mouth every 6 (six) hours as needed. For nausea/vomiting., Disp: , Rfl:  .  rosuvastatin (CRESTOR) 40 MG tablet, Take 20 mg by mouth at bedtime. , Disp: , Rfl:  .  senna-docusate (SENOKOT-S) 8.6-50 MG tablet, Take 1 tablet by mouth 2 (two) times daily. While taking pain meds to prevent  constipation., Disp: 30 tablet, Rfl: 0 .  tolterodine (DETROL LA) 4 MG 24 hr capsule, Take 4 mg by mouth daily., Disp: , Rfl:    Exam: Current vital signs: BP (!) 157/84   Pulse 92   Temp 98.8 F (37.1 C) (Oral)   Resp (!) 21   Ht 5\' 2"  (1.575 m)   Wt 88.5 kg (195 lb)   SpO2 94%   BMI 35.67 kg/m  Vital signs in last 24 hours: Temp:  [98.8 F (37.1 C)] 98.8 F (37.1 C) (07/19 1657) Pulse Rate:  [92-103] 92 (07/19 1715) Resp:  [18-21] 21 (07/19 1715) BP: (157-211)/(84-98) 157/84 (07/19 1715) SpO2:  [94 %-98 %] 94 % (07/19 1715) Weight:  [88.5 kg (195 lb)] 88.5 kg (195 lb) (07/19 1657)  GENERAL: Awake, alert in NAD HEENT: - Normocephalic and atraumatic, dry mm, no LN++, no Thyromegally LUNGS - Clear to auscultation bilaterally with no wheezes CV - S1S2 RRR, no m/r/g, equal pulses bilaterally. ABDOMEN - Soft, nontender, nondistended with normoactive BS Ext: warm, well perfused, intact peripheral pulses, no edema  NEURO:  Mental Status: AA&Ox3  Language: speech is clear.  Naming, repetition, fluency, and comprehension intact. Cranial Nerves: PERRL. EOMI, visual fields full, subtle left nasolabial fold flattening, facial sensation intact, hearing intact, tongue/uvula/soft palate midline, normal sternocleidomastoid and trapezius muscle strength. No evidence of tongue atrophy or fibrillations Motor: Right upper 5/5, right lower 5/5, left upper extremity with mild fine tremor and normal tone 5/5, left lower extremity with 5/5. Sensation- Intact to light touch bilaterally.  No extinction. Coordination: FTN intact bilaterally Gait- deferred  NIHSS-1 for facial   Labs I have reviewed labs in epic and the results pertinent to this consultation are: CBC    Component Value Date/Time   WBC 10.5 09/29/2017 1700   RBC 4.70 09/29/2017 1700   HGB 14.3 09/29/2017 1712   HCT 42.0 09/29/2017 1712   PLT 231 09/29/2017 1700   MCV 89.8 09/29/2017 1700   MCH 28.3 09/29/2017 1700   MCHC  31.5 09/29/2017 1700   RDW 14.0 09/29/2017 1700   LYMPHSABS 1.7 09/29/2017 1700   MONOABS 0.7 09/29/2017 1700   EOSABS 0.7 09/29/2017 1700   BASOSABS 0.1 09/29/2017 1700    CMP     Component Value Date/Time   NA 138 09/29/2017 1712   K 4.5 09/29/2017 1712   CL 106 09/29/2017 1712   CO2 25 09/29/2017 1700   GLUCOSE 141 (H) 09/29/2017 1712   BUN 11 09/29/2017 1712   CREATININE 0.60 09/29/2017 1712   CALCIUM 10.1 09/29/2017 1700   PROT 7.5 09/29/2017 1700   ALBUMIN 4.2 09/29/2017 1700   AST 23 09/29/2017 1700  ALT 22 09/29/2017 1700   ALKPHOS 101 09/29/2017 1700   BILITOT 0.5 09/29/2017 1700   GFRNONAA >60 09/29/2017 1700   GFRAA >60 09/29/2017 1700   Imaging I have reviewed the images obtained: CT head-no acute changes CT Angio head and neck -no LVO.  Assessment:  67 year old woman with past medical history as above presenting to the emergency room with a last known normal time outside the TPA window complaining of difficulty with word finding, difficulty with swallowing, and possible left nasolabial fold flattening. Clinical history and description of events, most of which are resolved concerning for stroke/TIA although it is difficult to pinpoint the vascular territory/localization. Given her multiple risk factors I think it is prudent to admit her for stroke/TIA work-up.  Impression: Evaluate for stroke/TIA Hypertension Diabetes Sleep apnea  Recommendations: -Admit to observation -Telemetry monitoring -Allow for permissive hypertension for the first 24-48h - only treat PRN if SBP >220 mmHg. Blood pressures can be gradually normalized to SBP<140 upon discharge. -MRI brain without contrast -Echocardiogram -HgbA1c, fasting lipid panel -Frequent neuro checks -Prophylactic therapy-Antiplatelet med: Aspirin - dose 325mg  -Atorvastatin 80 mg PO daily -Risk factor modification -PT consult, OT consult, Speech consult  Please page stroke NP/PA/MD (listed on AMION)   from 8am-4 pm as this patient will be followed by the stroke team at this point.  -- Amie Portland, MD [facility] Pager: [phone] If 7pm to 7am, please call on call as listed on AMION.

## 2017-09-29 NOTE — Progress Notes (Signed)
Patient admitted from the ED to room 15, daughter at bedside, on Tele and verified, initiated neuro checks and NIH per unit protocol

## 2017-09-29 NOTE — ED Notes (Signed)
Dr Rory Percy in room

## 2017-09-29 NOTE — ED Notes (Signed)
Pt back from CTA

## 2017-09-29 NOTE — ED Provider Notes (Signed)
MSE was initiated and I personally evaluated the patient and placed orders (if any) at  5:02 PM on September 29, 2017.  The patient appears stable so that the remainder of the MSE may be completed by another provider.  Patient placed in Quick Look pathway, seen and evaluated   Chief Complaint: aphasia since 0730 this morning  HPI:   Patient presents to ED for evaluation of aphasia and pain with swallowing.  Symptoms began at 7:30 AM this morning.  States that she feels that symptoms have slowly been resolving.  States that her legs feel heavy but is ambulatory with a normal gait.  Denies history of stroke in the past.  Denies any head injuries or falls.  ROS: Aphasia  Physical Exam:   Gen: No distress  Neuro: Awake and Alert  Skin: Warm    Focused Exam: PERRL. Cranial nerves appear grossly intact. No facial asymmetry noted. Very slighly decreased strength in LUE. No slurred speech noted. EOMs intact.  Order stroke labs, CT head, room ASAP.   Initiation of care has begun. The patient has been counseled on the process, plan, and necessity for staying for the completion/evaluation, and the remainder of the medical screening examination    Delia Heady, PA-C 09/29/17 1704    Tegeler, Gwenyth Allegra, MD 09/30/17 [phone]

## 2017-09-29 NOTE — ED Triage Notes (Signed)
Pt reports waking up this am at 0730 when she started having difficulty speaking, sx have since resolved some. Pt reports some difficulty swallowing to right side as well. Pt states that her legs feel heavy but is able to ambulate. Pt denies any pain. No hx of stroke in past. A&Ox4.

## 2017-09-29 NOTE — ED Provider Notes (Signed)
[facility] EMERGENCY DEPARTMENT Provider Note   CSN: [id number] Arrival date & time: 09/29/17  1651  History   Chief Complaint Chief Complaint  Patient presents with  . Stroke sx  . Aphasia   HPI  Patient is a 67 year old female with history of hypertension, diabetes, and OSA presenting to the ED for left-sided weakness and speech changes. She states she went to bed last night at 11 PM and awoke this morning at about 7:30 with weakness in her left arm and leg. Symptoms were mild and evaluation initially therefore deferred. This afternoon, daughter felt that symptoms were worse and brought her to the ED. No recent falls, headache, or other head injury. Daughter felt that speech was slurred today. No vision changes. No chest pain, dyspnea, or vomiting. No other recent illness or injury. She has not had similar symptoms in the past.  Past Medical History:  Diagnosis Date  . Anginal pain ([place])    evaluated by Dr Wynonia Lawman, records on chart   . Anxiety   . Arthritis   . Asthma   . Basal cell adenocarcinoma    nose  . Diabetes mellitus without complication ([place])   . History of kidney stones   . Hypertension   . Pneumonia   . PONV (postoperative nausea and vomiting)   . Sleep apnea    mild per pt-  no CPAP    Patient Active Problem List   Diagnosis Date Noted  . Left-sided weakness 09/29/2017  . Abdominal pain, left lower quadrant 05/01/2012    Past Surgical History:  Procedure Laterality Date  . ABDOMINAL HYSTERECTOMY    . APPENDECTOMY    . BREAST BIOPSY    . BREAST EXCISIONAL BIOPSY    . BREAST SURGERY     x 3  . CHOLECYSTECTOMY    . CYSTOSCOPY WITH RETROGRADE PYELOGRAM, URETEROSCOPY AND STENT PLACEMENT Right 12/23/2015   Procedure: CYSTOSCOPY WITH RETROGRADE PYELOGRAM, URETEROSCOPY AND STENT PLACEMENT;  Surgeon: Alexis Frock, MD;  Location: [facility];  Service: Urology;  Laterality: Right;  . DIAGNOSTIC LAPAROSCOPY    . HOLMIUM LASER APPLICATION Right  09/47/0962   Procedure: HOLMIUM LASER APPLICATION;  Surgeon: Alexis Frock, MD;  Location: [facility];  Service: Urology;  Laterality: Right;  . salpingectomy with oophorectomy       OB History   None      Home Medications    Prior to Admission medications   Medication Sig Start Date End Date Taking? Authorizing Provider  aspirin EC 81 MG tablet Take 81 mg by mouth daily.   Yes [provider]  LORazepam (ATIVAN) 1 MG tablet Take 0.5 mg by mouth 2 (two) times daily as needed for anxiety or sleep.    Yes [provider]  metFORMIN (GLUCOPHAGE) 500 MG tablet Take 500 mg by mouth 2 (two) times daily. 07/20/17  Yes [provider]  metoprolol tartrate (LOPRESSOR) 25 MG tablet Take 25 mg by mouth 2 (two) times daily.   Yes [provider]  oxybutynin (DITROPAN-XL) 5 MG 24 hr tablet Take 5 mg by mouth daily. 08/30/17  Yes [provider]  rosuvastatin (CRESTOR) 40 MG tablet Take 20 mg by mouth at bedtime.    Yes [provider]  oxyCODONE-acetaminophen (PERCOCET/ROXICET) 5-325 MG tablet Take 1-2 tablets by mouth every 4 (four) hours as needed for severe pain. Patient not taking: Reported on 09/29/2017 12/23/15   Alexis Frock, MD  senna-docusate (SENOKOT-S) 8.6-50 MG tablet Take 1 tablet by mouth 2 (  two) times daily. While taking pain meds to prevent constipation. Patient not taking: Reported on 09/29/2017 12/23/15   Alexis Frock, MD    Family History Family History  Problem Relation Age of Onset  . Cancer Mother   . Breast cancer Mother   . Heart disease Father     Social History Social History   Tobacco Use  . Smoking status: Never Smoker  . Smokeless tobacco: Never Used  Substance Use Topics  . Alcohol use: No  . Drug use: No     Allergies   Ace inhibitors; Ciprofloxacin; Sular [nisoldipine er]; Vitamin d analogs; Welchol [colesevelam hcl]; Erythromycin; Sulfa antibiotics; and Tape   Review of Systems Review of  Systems  Constitutional: Negative for fever.  HENT: Negative for congestion and sore throat.   Eyes: Negative for visual disturbance.  Respiratory: Negative for shortness of breath.   Cardiovascular: Negative for chest pain.  Gastrointestinal: Negative for abdominal pain, diarrhea and vomiting.  Genitourinary: Negative for dysuria.  Musculoskeletal: Negative for neck pain.  Neurological: Positive for facial asymmetry, speech difficulty and weakness. Negative for syncope.  All other systems reviewed and are negative.    Physical Exam Updated Vital Signs BP (!) 140/92   Pulse 88   Temp 98.1 F (36.7 C) (Oral)   Resp 20   Ht 5\' 2"  (1.575 m)   Wt 88.5 kg (195 lb)   SpO2 96%   BMI 35.67 kg/m   Physical Exam  Constitutional: She is oriented to person, place, and time. No distress.  HENT:  Head: Normocephalic and atraumatic.  Mouth/Throat: Oropharynx is clear and moist.  Eyes: Pupils are equal, round, and reactive to light. Conjunctivae are normal.  Neck: Neck supple. No tracheal deviation present.  Cardiovascular: Normal rate, regular rhythm, normal heart sounds and intact distal pulses.  No murmur heard. Pulmonary/Chest: Effort normal and breath sounds normal. No stridor. No respiratory distress. She has no wheezes. She has no rales.  Abdominal: Soft. She exhibits no distension and no mass. There is no tenderness. There is no guarding.  Musculoskeletal: She exhibits no edema or deformity.  Neurological: She is alert and oriented to person, place, and time.  Speech is fluent. Trace droop of the corner of the left mouth. Otherwise, CN III through XII tested and found to be intact. 4/5 strength in the LLE, otherwise 5/5 strength in all extremities with sensation to light touch intact throughout. No dysmetria on finger to nose testing.  Skin: Skin is warm and dry.  Psychiatric: She has a normal mood and affect. Her behavior is normal.  Nursing note and vitals reviewed.    ED  Treatments / Results  Labs (all labs ordered are listed, but only abnormal results are displayed) Labs Reviewed  COMPREHENSIVE METABOLIC PANEL - Abnormal; Notable for the following components:      Result Value   Glucose, Bld 144 (*)    All other components within normal limits  GLUCOSE, CAPILLARY - Abnormal; Notable for the following components:   Glucose-Capillary 132 (*)    All other components within normal limits  I-STAT CHEM 8, ED - Abnormal; Notable for the following components:   Glucose, Bld 141 (*)    All other components within normal limits  PROTIME-INR  APTT  CBC  DIFFERENTIAL  TSH  HIV ANTIBODY (ROUTINE TESTING)  HEMOGLOBIN A1C  LIPID PANEL  CBC WITH DIFFERENTIAL/PLATELET  BASIC METABOLIC PANEL  I-STAT TROPONIN, ED  CBG MONITORING, ED    EKG EKG Interpretation  Date/Time:  Friday September 29 2017 16:58:18 EDT Ventricular Rate:  109 PR Interval:  164 QRS Duration: 78 QT Interval:  328 QTC Calculation: 441 R Axis:   153 Text Interpretation:  Sinus tachycardia Indeterminate axis Borderline ECG When compared to prior, faster rate.  No STEMI Confirmed by Antony Blackbird [phone]) on 09/29/2017 5:16:48 PM   Radiology Ct Angio Head W Or Wo Contrast  Result Date: 09/29/2017 CLINICAL DATA:  Transient speech difficulties this morning. Dysphagia and leg heaviness. Suspect stroke. History of hypertension, diabetes. EXAM: CT ANGIOGRAPHY HEAD AND NECK TECHNIQUE: Multidetector CT imaging of the head and neck was performed using the standard protocol during bolus administration of intravenous contrast. Multiplanar CT image reconstructions and MIPs were obtained to evaluate the vascular anatomy. Carotid stenosis measurements (when applicable) are obtained utilizing NASCET criteria, using the distal internal carotid diameter as the denominator. CONTRAST:  22mL ISOVUE-370 IOPAMIDOL (ISOVUE-370) INJECTION 76% COMPARISON:  CT neck January 09, 2015. FINDINGS: CT HEAD FINDINGS BRAIN: No  intraparenchymal hemorrhage, mass effect nor midline shift. The ventricles and sulci are normal for age. No acute large vascular territory infarcts. No abnormal extra-axial fluid collections. Basal cisterns are patent. Punctate dural calcification anterior falx. VASCULAR: Mild calcific atherosclerosis of the carotid siphons. SKULL: No skull fracture. No significant scalp soft tissue swelling. SINUSES/ORBITS: The mastoid air-cells and included paranasal sinuses are well-aerated.The included ocular globes and orbital contents are non-suspicious. OTHER: None. CTA NECK FINDINGS: AORTIC ARCH: Normal appearance of the thoracic arch, normal branch pattern. Mild calcific atherosclerosis aortic arch. The origins of the innominate, left Common carotid artery and subclavian artery are widely patent. RIGHT CAROTID SYSTEM: Common carotid artery is patent. Retropharyngeal carotid bifurcation without hemodynamically significant stenosis by NASCET criteria. Normal appearance of the internal carotid artery. LEFT CAROTID SYSTEM: Common carotid artery is patent. Normal appearance of the carotid bifurcation without hemodynamically significant stenosis by NASCET criteria. Normal appearance of the internal carotid artery. VERTEBRAL ARTERIES:Left vertebral artery is dominant. Normal appearance of the vertebral arteries, widely patent. SKELETON: No acute osseous process though bone windows have not been submitted. Coarse calcific enthesopathy longus coli insertion. Moderate to severe C4-5, moderate C5-6 and C6-7 degenerative discs. OTHER NECK: Soft tissues of the neck are nonacute though, not tailored for evaluation. New 12 mm hypodense LEFT thyroid nodule. No routine indicated follow-up; this follows ACR consensus guidelines: Managing Incidental Thyroid Nodules Detected on Imaging: White Paper of the ACR Incidental Thyroid Findings Committee. J Am Coll Radiol 2015; 12:143-150. UPPER CHEST: Included lung apices are clear. No superior  mediastinal lymphadenopathy. CTA HEAD FINDINGS: ANTERIOR CIRCULATION: Patent cervical internal carotid arteries, petrous, cavernous and supra clinoid internal carotid arteries. Patent anterior communicating artery. Patent anterior and middle cerebral arteries, mild luminal irregularity compatible with atherosclerosis. No large vessel occlusion, significant stenosis, contrast extravasation or aneurysm. POSTERIOR CIRCULATION: Patent vertebral arteries, vertebrobasilar junction and basilar artery, as well as main branch vessels. Patent posterior cerebral arteries. LEFT PCOM insertional infundibulum. Moderate tandem stenosis LEFT posterior cerebral artery. No large vessel occlusion, contrast extravasation or aneurysm. VENOUS SINUSES: Major dural venous sinuses are patent though not tailored for evaluation on this angiographic examination. ANATOMIC VARIANTS: None. DELAYED PHASE: No abnormal intracranial enhancement. MIP images reviewed. IMPRESSION: CT HEAD: 1. Normal CT HEAD with without contrast for age. CTA NECK: 1. No hemodynamically significant stenosis ICA. 2. Patent vertebral arteries. CTA HEAD: 1. No emergent large vessel occlusion or flow-limiting stenosis. 2. Moderate stenosis LEFT PCA most compatible with atherosclerosis. Aortic Atherosclerosis (ICD10-I70.0). Electronically Signed   By: Sandie Ano  Bloomer M.D.   On: 09/29/2017 18:50   Ct Angio Neck W Or Wo Contrast  Result Date: 09/29/2017 CLINICAL DATA:  Transient speech difficulties this morning. Dysphagia and leg heaviness. Suspect stroke. History of hypertension, diabetes. EXAM: CT ANGIOGRAPHY HEAD AND NECK TECHNIQUE: Multidetector CT imaging of the head and neck was performed using the standard protocol during bolus administration of intravenous contrast. Multiplanar CT image reconstructions and MIPs were obtained to evaluate the vascular anatomy. Carotid stenosis measurements (when applicable) are obtained utilizing NASCET criteria, using the distal  internal carotid diameter as the denominator. CONTRAST:  38mL ISOVUE-370 IOPAMIDOL (ISOVUE-370) INJECTION 76% COMPARISON:  CT neck January 09, 2015. FINDINGS: CT HEAD FINDINGS BRAIN: No intraparenchymal hemorrhage, mass effect nor midline shift. The ventricles and sulci are normal for age. No acute large vascular territory infarcts. No abnormal extra-axial fluid collections. Basal cisterns are patent. Punctate dural calcification anterior falx. VASCULAR: Mild calcific atherosclerosis of the carotid siphons. SKULL: No skull fracture. No significant scalp soft tissue swelling. SINUSES/ORBITS: The mastoid air-cells and included paranasal sinuses are well-aerated.The included ocular globes and orbital contents are non-suspicious. OTHER: None. CTA NECK FINDINGS: AORTIC ARCH: Normal appearance of the thoracic arch, normal branch pattern. Mild calcific atherosclerosis aortic arch. The origins of the innominate, left Common carotid artery and subclavian artery are widely patent. RIGHT CAROTID SYSTEM: Common carotid artery is patent. Retropharyngeal carotid bifurcation without hemodynamically significant stenosis by NASCET criteria. Normal appearance of the internal carotid artery. LEFT CAROTID SYSTEM: Common carotid artery is patent. Normal appearance of the carotid bifurcation without hemodynamically significant stenosis by NASCET criteria. Normal appearance of the internal carotid artery. VERTEBRAL ARTERIES:Left vertebral artery is dominant. Normal appearance of the vertebral arteries, widely patent. SKELETON: No acute osseous process though bone windows have not been submitted. Coarse calcific enthesopathy longus coli insertion. Moderate to severe C4-5, moderate C5-6 and C6-7 degenerative discs. OTHER NECK: Soft tissues of the neck are nonacute though, not tailored for evaluation. New 12 mm hypodense LEFT thyroid nodule. No routine indicated follow-up; this follows ACR consensus guidelines: Managing Incidental Thyroid  Nodules Detected on Imaging: White Paper of the ACR Incidental Thyroid Findings Committee. J Am Coll Radiol 2015; 12:143-150. UPPER CHEST: Included lung apices are clear. No superior mediastinal lymphadenopathy. CTA HEAD FINDINGS: ANTERIOR CIRCULATION: Patent cervical internal carotid arteries, petrous, cavernous and supra clinoid internal carotid arteries. Patent anterior communicating artery. Patent anterior and middle cerebral arteries, mild luminal irregularity compatible with atherosclerosis. No large vessel occlusion, significant stenosis, contrast extravasation or aneurysm. POSTERIOR CIRCULATION: Patent vertebral arteries, vertebrobasilar junction and basilar artery, as well as main branch vessels. Patent posterior cerebral arteries. LEFT PCOM insertional infundibulum. Moderate tandem stenosis LEFT posterior cerebral artery. No large vessel occlusion, contrast extravasation or aneurysm. VENOUS SINUSES: Major dural venous sinuses are patent though not tailored for evaluation on this angiographic examination. ANATOMIC VARIANTS: None. DELAYED PHASE: No abnormal intracranial enhancement. MIP images reviewed. IMPRESSION: CT HEAD: 1. Normal CT HEAD with without contrast for age. CTA NECK: 1. No hemodynamically significant stenosis ICA. 2. Patent vertebral arteries. CTA HEAD: 1. No emergent large vessel occlusion or flow-limiting stenosis. 2. Moderate stenosis LEFT PCA most compatible with atherosclerosis. Aortic Atherosclerosis (ICD10-I70.0). Electronically Signed   By: Elon Alas M.D.   On: 09/29/2017 18:50    Procedures Procedures (including critical care time)  Medications Ordered in ED Medications  iopamidol (ISOVUE-370) 76 % injection (has no administration in time range)     Initial Impression / Assessment and Plan / ED Course  I  have reviewed the triage vital signs and the nursing notes.  Pertinent labs & imaging results that were available during my care of the patient were reviewed by  me and considered in my medical decision making (see chart for details).  This is a 67 year old female presents the ED for left-sided weakness and concern for speech change as above. Clinical picture concerning for acute CVA. However, she presented outside of tPA window. Neurology was consulted and evaluated patient in ED. Please see Dr. Johny Chess note for details. CT/CTA shows moderate left PCA stenosis without other acute findings. Patient admitted to hospitalist for further neurologic evaluation and medical optimization for stroke prevention.  Final Clinical Impressions(s) / ED Diagnoses   Final diagnoses:  Stroke-like symptoms     Prescilla Sours, MD 09/30/17 0012    Tegeler, Gwenyth Allegra, MD 09/30/17 1218

## 2017-09-29 NOTE — ED Notes (Signed)
CT notified that pt has an 18ga IV and is ready for CT.

## 2017-09-29 NOTE — Progress Notes (Signed)
Pt states someone from home is going to bring her home CPAP unit. I told her to call if she needs any further assistance.

## 2017-09-30 ENCOUNTER — Observation Stay (HOSPITAL_COMMUNITY): Payer: Medicare Other

## 2017-09-30 ENCOUNTER — Other Ambulatory Visit (HOSPITAL_COMMUNITY): Payer: Medicare Other

## 2017-09-30 DIAGNOSIS — F419 Anxiety disorder, unspecified: Secondary | ICD-10-CM | POA: Diagnosis present

## 2017-09-30 DIAGNOSIS — I6300 Cerebral infarction due to thrombosis of unspecified precerebral artery: Secondary | ICD-10-CM | POA: Diagnosis not present

## 2017-09-30 DIAGNOSIS — E1169 Type 2 diabetes mellitus with other specified complication: Secondary | ICD-10-CM | POA: Diagnosis present

## 2017-09-30 DIAGNOSIS — Z9989 Dependence on other enabling machines and devices: Secondary | ICD-10-CM

## 2017-09-30 DIAGNOSIS — R0981 Nasal congestion: Secondary | ICD-10-CM | POA: Diagnosis present

## 2017-09-30 DIAGNOSIS — R29701 NIHSS score 1: Secondary | ICD-10-CM | POA: Diagnosis present

## 2017-09-30 DIAGNOSIS — Z7984 Long term (current) use of oral hypoglycemic drugs: Secondary | ICD-10-CM | POA: Diagnosis not present

## 2017-09-30 DIAGNOSIS — R002 Palpitations: Secondary | ICD-10-CM | POA: Diagnosis present

## 2017-09-30 DIAGNOSIS — J45909 Unspecified asthma, uncomplicated: Secondary | ICD-10-CM | POA: Diagnosis present

## 2017-09-30 DIAGNOSIS — G4733 Obstructive sleep apnea (adult) (pediatric): Secondary | ICD-10-CM

## 2017-09-30 DIAGNOSIS — R1310 Dysphagia, unspecified: Secondary | ICD-10-CM

## 2017-09-30 DIAGNOSIS — I10 Essential (primary) hypertension: Secondary | ICD-10-CM | POA: Diagnosis present

## 2017-09-30 DIAGNOSIS — Z87442 Personal history of urinary calculi: Secondary | ICD-10-CM | POA: Diagnosis not present

## 2017-09-30 DIAGNOSIS — Z881 Allergy status to other antibiotic agents status: Secondary | ICD-10-CM | POA: Diagnosis not present

## 2017-09-30 DIAGNOSIS — I6389 Other cerebral infarction: Secondary | ICD-10-CM | POA: Diagnosis not present

## 2017-09-30 DIAGNOSIS — E785 Hyperlipidemia, unspecified: Secondary | ICD-10-CM | POA: Diagnosis present

## 2017-09-30 DIAGNOSIS — R040 Epistaxis: Secondary | ICD-10-CM | POA: Diagnosis not present

## 2017-09-30 DIAGNOSIS — Z79899 Other long term (current) drug therapy: Secondary | ICD-10-CM | POA: Diagnosis not present

## 2017-09-30 DIAGNOSIS — I6381 Other cerebral infarction due to occlusion or stenosis of small artery: Secondary | ICD-10-CM | POA: Diagnosis not present

## 2017-09-30 DIAGNOSIS — R4701 Aphasia: Secondary | ICD-10-CM | POA: Diagnosis present

## 2017-09-30 DIAGNOSIS — E669 Obesity, unspecified: Secondary | ICD-10-CM | POA: Diagnosis present

## 2017-09-30 DIAGNOSIS — K580 Irritable bowel syndrome with diarrhea: Secondary | ICD-10-CM | POA: Diagnosis present

## 2017-09-30 DIAGNOSIS — R531 Weakness: Secondary | ICD-10-CM | POA: Diagnosis not present

## 2017-09-30 DIAGNOSIS — I639 Cerebral infarction, unspecified: Secondary | ICD-10-CM | POA: Diagnosis present

## 2017-09-30 DIAGNOSIS — Z6835 Body mass index (BMI) 35.0-35.9, adult: Secondary | ICD-10-CM | POA: Diagnosis not present

## 2017-09-30 DIAGNOSIS — Z7982 Long term (current) use of aspirin: Secondary | ICD-10-CM | POA: Diagnosis not present

## 2017-09-30 DIAGNOSIS — Z8701 Personal history of pneumonia (recurrent): Secondary | ICD-10-CM | POA: Diagnosis not present

## 2017-09-30 DIAGNOSIS — R29810 Facial weakness: Secondary | ICD-10-CM | POA: Diagnosis present

## 2017-09-30 DIAGNOSIS — I6622 Occlusion and stenosis of left posterior cerebral artery: Secondary | ICD-10-CM | POA: Diagnosis present

## 2017-09-30 DIAGNOSIS — I6349 Cerebral infarction due to embolism of other cerebral artery: Secondary | ICD-10-CM | POA: Diagnosis present

## 2017-09-30 DIAGNOSIS — E119 Type 2 diabetes mellitus without complications: Secondary | ICD-10-CM | POA: Diagnosis present

## 2017-09-30 LAB — CBC WITH DIFFERENTIAL/PLATELET
Abs Immature Granulocytes: 0 10*3/uL (ref 0.0–0.1)
BASOS ABS: 0.1 10*3/uL (ref 0.0–0.1)
Basophils Relative: 1 %
Eosinophils Absolute: 0.6 10*3/uL (ref 0.0–0.7)
Eosinophils Relative: 7 %
HCT: 43.8 % (ref 36.0–46.0)
HEMOGLOBIN: 13.8 g/dL (ref 12.0–15.0)
Immature Granulocytes: 0 %
LYMPHS PCT: 17 %
Lymphs Abs: 1.7 10*3/uL (ref 0.7–4.0)
MCH: 28.2 pg (ref 26.0–34.0)
MCHC: 31.5 g/dL (ref 30.0–36.0)
MCV: 89.4 fL (ref 78.0–100.0)
Monocytes Absolute: 0.7 10*3/uL (ref 0.1–1.0)
Monocytes Relative: 7 %
Neutro Abs: 6.5 10*3/uL (ref 1.7–7.7)
Neutrophils Relative %: 68 %
Platelets: 255 10*3/uL (ref 150–400)
RBC: 4.9 MIL/uL (ref 3.87–5.11)
RDW: 14.2 % (ref 11.5–15.5)
WBC: 9.7 10*3/uL (ref 4.0–10.5)

## 2017-09-30 LAB — BASIC METABOLIC PANEL
Anion gap: 13 (ref 5–15)
BUN: 10 mg/dL (ref 8–23)
CHLORIDE: 102 mmol/L (ref 98–111)
CO2: 25 mmol/L (ref 22–32)
CREATININE: 0.77 mg/dL (ref 0.44–1.00)
Calcium: 9.8 mg/dL (ref 8.9–10.3)
GFR calc Af Amer: >60 mL/min (ref >60)
GFR calc non Af Amer: >60 mL/min (ref >60)
Glucose, Bld: 185 mg/dL — ABNORMAL HIGH (ref 70–99)
Potassium: 4.4 mmol/L (ref 3.5–5.1)
SODIUM: 140 mmol/L (ref 135–145)

## 2017-09-30 LAB — LIPID PANEL
Cholesterol: 158 mg/dL (ref 0–200)
HDL: 41 mg/dL (ref >40)
LDL CALC: 83 mg/dL (ref 0–99)
Total CHOL/HDL Ratio: 3.9 RATIO
Triglycerides: 169 mg/dL — ABNORMAL HIGH (ref <150)
VLDL: 34 mg/dL (ref 0–40)

## 2017-09-30 LAB — GLUCOSE, CAPILLARY
GLUCOSE-CAPILLARY: 157 mg/dL — AB (ref 70–99)
GLUCOSE-CAPILLARY: 114 mg/dL — AB (ref 70–99)
GLUCOSE-CAPILLARY: 116 mg/dL — AB (ref 70–99)
Glucose-Capillary: 163 mg/dL — ABNORMAL HIGH (ref 70–99)

## 2017-09-30 LAB — HIV ANTIBODY (ROUTINE TESTING W REFLEX): HIV Screen 4th Generation wRfx: NONREACTIVE

## 2017-09-30 LAB — HEMOGLOBIN A1C
HEMOGLOBIN A1C: 7.1 % — AB (ref 4.8–5.6)
Mean Plasma Glucose: 157.07 mg/dL

## 2017-09-30 MED ORDER — OXYMETAZOLINE HCL 0.05 % NA SOLN
1.0000 | Freq: Two times a day (BID) | NASAL | Status: DC
  Administered 2017-09-30 – 2017-10-01 (×2): 1 via NASAL
  Filled 2017-09-30: qty 15

## 2017-09-30 MED ORDER — OXYMETAZOLINE HCL 0.05 % NA SOLN
1.0000 | Freq: Two times a day (BID) | NASAL | Status: DC
  Filled 2017-09-30: qty 15

## 2017-09-30 MED ORDER — CLOPIDOGREL BISULFATE 75 MG PO TABS
75.0000 mg | ORAL_TABLET | Freq: Every day | ORAL | Status: DC
  Administered 2017-09-30 – 2017-10-01 (×2): 75 mg via ORAL
  Filled 2017-09-30 (×2): qty 1

## 2017-09-30 MED ORDER — LORAZEPAM 2 MG/ML IJ SOLN
1.0000 mg | Freq: Once | INTRAMUSCULAR | Status: AC
  Administered 2017-09-30: 1 mg via INTRAVENOUS
  Filled 2017-09-30: qty 1

## 2017-09-30 MED ORDER — ASPIRIN EC 81 MG PO TBEC
81.0000 mg | DELAYED_RELEASE_TABLET | Freq: Every day | ORAL | Status: DC
  Administered 2017-09-30 – 2017-10-01 (×2): 81 mg via ORAL
  Filled 2017-09-30 (×2): qty 1

## 2017-09-30 NOTE — Evaluation (Signed)
Physical Therapy Evaluation Patient Details Name: Christine Navarro MRN: [id number] DOB: 1950-03-19 Today's Date: 09/30/2017   History of Present Illness  67 yo female presenting with mild weakness on the left side, difficulty word finding, mild left-sided facial droop.  MRI showing acute ischemic nonhemorrhagic right basal ganglia lacunar infarct.  Clinical Impression  Christine Navarro is a very pleasant 66 y/o female admitted with the above listed diagnosis. Patient reporting that prior to admission she was independent with all aspects of mobility. Patient today requiring Min guard for all transfers and mobility for patient safety with patient reporting L LE weakness primary limiting factor. Will recommend outpatient PT at discharge to continue to progress safe and independent functional mobility. PT to continue to follow acutely.     Follow Up Recommendations Outpatient PT;Supervision/Assistance - 24 hour    Equipment Recommendations  None recommended by PT    Recommendations for Other Services       Precautions / Restrictions Precautions Precautions: Fall Restrictions Weight Bearing Restrictions: No      Mobility  Bed Mobility Overal bed mobility: Modified Independent             General bed mobility comments: Increased time and effort  Transfers Overall transfer level: Needs assistance Equipment used: None Transfers: Sit to/from Stand Sit to Stand: Min guard         General transfer comment: min guard for safety and immediate standing balance  Ambulation/Gait Ambulation/Gait assistance: Min guard Gait Distance (Feet): 100 Feet Assistive device: None Gait Pattern/deviations: Step-to pattern;Step-through pattern;Decreased stride length;Shuffle;Drifts right/left Gait velocity: decreased   General Gait Details: very slow pace; patient reporting fatigue limiting patient; 1 episode of slight L knee buckling with gait; reaches for walls/handrails in hallway  Stairs             Wheelchair Mobility    Modified Rankin (Stroke Patients Only) Modified Rankin (Stroke Patients Only) Pre-Morbid Rankin Score: No symptoms Modified Rankin: Moderately severe disability     Balance Overall balance assessment: Needs assistance Sitting-balance support: Feet supported;No upper extremity supported Sitting balance-Leahy Scale: Good     Standing balance support: No upper extremity supported;During functional activity Standing balance-Leahy Scale: Fair                               Pertinent Vitals/Pain Pain Assessment: No/denies pain    Home Living Family/patient expects to be discharged to:: Private residence Living Arrangements: Children Available Help at Discharge: Family;Available 24 hours/day Type of Home: House Home Access: Level entry     Home Layout: Two level;Able to live on main level with bedroom/bathroom Home Equipment: Hand held shower head;Cane - quad;Cane - single point      Prior Function Level of Independence: Independent         Comments: ADL,s IADLs, driving     Hand Dominance   Dominant Hand: Right    Extremity/Trunk Assessment   Upper Extremity Assessment Upper Extremity Assessment: Defer to OT evaluation LUE Deficits / Details: Decreased coorindation and slow movements compared to RUE. Decreased grasp strength.  LUE Coordination: decreased fine motor;decreased gross motor    Lower Extremity Assessment Lower Extremity Assessment: Generalized weakness;LLE deficits/detail LLE Deficits / Details: "it feels heavy"; 1 episode of slight buckling with gait LLE Coordination: decreased gross motor    Cervical / Trunk Assessment Cervical / Trunk Assessment: Normal  Communication   Communication: Expressive difficulties  Cognition Arousal/Alertness: Awake/alert Behavior During Therapy: [facility]  for tasks assessed/performed Overall Cognitive Status: Impaired/Different from baseline Area of Impairment:  Safety/judgement;Christine Navarro Recovery Scale                     Memory: Decreased short-term memory   Safety/Judgement: Decreased awareness of safety;Decreased awareness of deficits Awareness: Emergent Problem Solving: Slow processing;Requires verbal cues General Comments: Pt with slower processing compared to baseline and reuqiredi ncreased time thorughout session. Pt with decreased ST memory not recalling that she was asked to rbush her teeth at sink.      General Comments General comments (skin integrity, edema, etc.): daughter present and engaged during session; BP following activity 180/89    Exercises     Assessment/Plan    PT Assessment Patient needs continued PT services  PT Problem List Decreased strength;Decreased activity tolerance;Decreased mobility;Decreased balance;Decreased knowledge of use of DME;Decreased safety awareness;Decreased knowledge of precautions       PT Treatment Interventions DME instruction;Gait training;Stair training;Functional mobility training;Therapeutic activities;Therapeutic exercise;Balance training;Neuromuscular re-education;Patient/family education    PT Goals (Current goals can be found in the Care Plan section)  Acute Rehab PT Goals Patient Stated Goal: Go home today PT Goal Formulation: With patient Time For Goal Achievement: 10/07/17 Potential to Achieve Goals: Good    Frequency Min 4X/week   Barriers to discharge        Co-evaluation               AM-PAC PT "6 Clicks" Daily Activity  Outcome Measure Difficulty turning over in bed (including adjusting bedclothes, sheets and blankets)?: A Little Difficulty moving from lying on back to sitting on the side of the bed? : A Little Difficulty sitting down on and standing up from a chair with arms (e.g., wheelchair, bedside commode, etc,.)?: Unable Help needed moving to and from a bed to chair (including a wheelchair)?: A Little Help needed walking in hospital room?: A  Little Help needed climbing 3-5 steps with a railing? : A Little 6 Click Score: 16    End of Session Equipment Utilized During Treatment: Gait belt Activity Tolerance: Patient tolerated treatment well;Patient limited by fatigue Patient left: in chair;with call bell/phone within reach;with chair alarm set;with family/visitor present Nurse Communication: Mobility status PT Visit Diagnosis: Unsteadiness on feet (R26.81);Other abnormalities of gait and mobility (R26.89);Muscle weakness (generalized) (M62.81)    Time: 1308-6578 PT Time Calculation (min) (ACUTE ONLY): 20 min   Charges:   PT Evaluation $PT Eval Moderate Complexity: 1 Mod     PT G Codes:        Lanney Gins, PT, DPT 09/30/17 9:05 AM Pager: [phone]

## 2017-09-30 NOTE — Evaluation (Signed)
Occupational Therapy Evaluation Patient Details Name: Christine Navarro MRN: [id number] DOB: December 26, 1950 Today's Date: 09/30/2017    History of Present Illness 67 yo female presenting with mild weakness on the left side, difficulty word finding, mild left-sided facial droop.  MRI showing acute ischemic nonhemorrhagic right basal ganglia lacunar infarct.   Clinical Impression   PTA, pt was living with her daughter and granddaughter and was independent. Pt currently requiring supervision for ADLs with exception of Min A for donning socks and Min Guard A for functional mobility.  Pt presenting with decreased coordination and strength at LUE as well as slow processing requiring increased time throughout session. Pt would benefit from further acute OT to facilitate safe dc. Recommend dc to home with follow OP OT for further OT to optimize safety, independence with ADLs, and return to PLOF.      Follow Up Recommendations  Outpatient OT;Supervision/Assistance - 24 hour    Equipment Recommendations  None recommended by OT    Recommendations for Other Services PT consult     Precautions / Restrictions Precautions Precautions: Fall      Mobility Bed Mobility Overal bed mobility: Modified Independent             General bed mobility comments: Increased time and effort  Transfers Overall transfer level: Needs assistance   Transfers: Sit to/from Stand Sit to Stand: Min guard         General transfer comment: MIn GUard A for safety    Balance                                           ADL either performed or assessed with clinical judgement   ADL Overall ADL's : Needs assistance/impaired Eating/Feeding: Set up;Sitting   Grooming: Oral care;Wash/dry face;Wash/dry hands;Brushing hair;Set up;Supervision/safety;Standing   Upper Body Bathing: Set up;Supervision/ safety;Standing   Lower Body Bathing: Sit to/from stand;Min guard   Upper Body Dressing : Set  up;Supervision/safety;Sitting   Lower Body Dressing: Minimal assistance;Sit to/from stand Lower Body Dressing Details (indicate cue type and reason): Min A for donning right sock. Pt donning/doffing underwear with Min GUard A Toilet Transfer: Set up;Supervision/safety;Ambulation;Regular Toilet   Toileting- Water quality scientist and Hygiene: Supervision/safety;Sit to/from stand;Sitting/lateral lean Toileting - Clothing Manipulation Details (indicate cue type and reason): Pt management clothing and toielt hygiene with supervision for safety     Functional mobility during ADLs: Min guard General ADL Comments: Pt presenting near baseline function. Requiring increased time throughout session     Vision Baseline Vision/History: Wears glasses Wears Glasses: Reading only;Distance only Patient Visual Report: Diplopia;Other (comment)(Diplopia yesterday. None today) Vision Assessment?: Yes Eye Alignment: Within Functional Limits Ocular Range of Motion: Within Functional Limits Alignment/Gaze Preference: Within Defined Limits Tracking/Visual Pursuits: Able to track stimulus in all quads without difficulty Saccades: Within functional limits Convergence: Within functional limits Visual Fields: No apparent deficits Depth Perception: Undershoots     Perception     Praxis      Pertinent Vitals/Pain Pain Assessment: No/denies pain     Hand Dominance Right   Extremity/Trunk Assessment Upper Extremity Assessment Upper Extremity Assessment: LUE deficits/detail LUE Deficits / Details: Decreased coorindation and slow movements compared to RUE. Decreased grasp strength.  LUE Coordination: decreased fine motor;decreased gross motor   Lower Extremity Assessment Lower Extremity Assessment: Defer to PT evaluation   Cervical / Trunk Assessment Cervical / Trunk Assessment: Normal  Communication Communication Communication: Expressive difficulties   Cognition Arousal/Alertness:  Awake/alert Behavior During Therapy: WFL for tasks assessed/performed Overall Cognitive Status: Impaired/Different from baseline Area of Impairment: Problem solving;Awareness;Memory                     Memory: Decreased short-term memory     Awareness: Emergent Problem Solving: Slow processing;Requires verbal cues General Comments: Pt with slower processing compared to baseline and reuqiredi ncreased time thorughout session. Pt with decreased ST memory not recalling that she was asked to rbush her teeth at sink.   General Comments  Daughter present during evaluation    Exercises     Shoulder Instructions      Home Living Family/patient expects to be discharged to:: Private residence Living Arrangements: Children Available Help at Discharge: Family;Available 24 hours/day Type of Home: House Home Access: Level entry     Home Layout: One level;Able to live on main level with bedroom/bathroom     Bathroom Shower/Tub: Walk-in shower;Tub only   Bathroom Toilet: Standard     Home Equipment: Hand held shower head;Cane - quad;Cane - single point          Prior Functioning/Environment Level of Independence: Independent        Comments: ADL,s IADLs, driving        OT Problem List: Decreased strength;Decreased range of motion;Decreased activity tolerance;Impaired balance (sitting and/or standing);Decreased cognition;Decreased knowledge of use of DME or AE;Decreased knowledge of precautions;Impaired UE functional use      OT Treatment/Interventions: Self-care/ADL training;Therapeutic exercise;Energy conservation;DME and/or AE instruction;Therapeutic activities;Patient/family education    OT Goals(Current goals can be found in the care plan section) Acute Rehab OT Goals Patient Stated Goal: Go home today OT Goal Formulation: With patient Time For Goal Achievement: 10/14/17 Potential to Achieve Goals: Good ADL Goals Pt Will Perform Grooming: with modified  independence;standing Pt Will Perform Lower Body Dressing: with modified independence;sit to/from stand Pt Will Transfer to Toilet: with modified independence;ambulating;regular height toilet Pt Will Perform Tub/Shower Transfer: Shower transfer;with modified independence;ambulating Additional ADL Goal #1: Pt will perform three part trail making task with 1-2 cues for processing  OT Frequency: Min 2X/week   Barriers to D/C:            Co-evaluation              AM-PAC PT "6 Clicks" Daily Activity     Outcome Measure Help from another person eating meals?: None Help from another person taking care of personal grooming?: None Help from another person toileting, which includes using toliet, bedpan, or urinal?: None Help from another person bathing (including washing, rinsing, drying)?: None Help from another person to put on and taking off regular upper body clothing?: None Help from another person to put on and taking off regular lower body clothing?: A Little 6 Click Score: 23   End of Session Equipment Utilized During Treatment: Gait belt Nurse Communication: Mobility status  Activity Tolerance: Patient tolerated treatment well Patient left: with call bell/phone within reach(with PT)  OT Visit Diagnosis: Unsteadiness on feet (R26.81);Other abnormalities of gait and mobility (R26.89);Muscle weakness (generalized) (M62.81)                Time: 8119-1478 OT Time Calculation (min): 31 min Charges:  OT General Charges $OT Visit: 1 Visit OT Evaluation $OT Eval Moderate Complexity: 1 Mod OT Treatments $Self Care/Home Management : 8-22 mins G-Codes:     [place], OTR/L Acute Rehab Pager: [phone] Office: [phone]  Lianne Bushy  M Ameisha Mcclellan 09/30/2017, 8:51 AM

## 2017-09-30 NOTE — Progress Notes (Addendum)
Pt back from MRI.        Loretha Stapler, RN

## 2017-09-30 NOTE — Progress Notes (Signed)
PROGRESS NOTE    DENECE Navarro  EVO:[id number] DOB: 04/11/1950 DOA: 09/29/2017 PCP: Mayra Neer, MD   Brief Narrative: Christine Sailer Smithis a 67 y.o.right handdominatefemalewith medical history significant ofHTN, HLD, DM type II, OSA on CPAP, anxiety;who woke up around 7:30 a.m. not feeling well. Last noted to be normal prior to going to bed around 10 PM last night. She reported feeling as though the left side of her face was drawing up and her speech was slurred. While trying toambulate patient reported feeling as though her legs were heavy and weak most noticeably on the left side. Associated symptoms included complaints of feeling off balance,sinus congestion, and difficulty word finding. Her weakness on presentation has improved.  She does not have any facial droop and her speech is clear this morning.  Patient evaluated by physical therapy and recommended  outpatient PT/OT .Patient was evaluated by neurology . MRI brain showed 2.3 cm acute ischemic nonhemorrhagic right basal ganglia lacunar Infarct. 13 mm meningioma overlying the left frontal convexity without associated mass effect.  She has been started on aspirin and Plavix which will be continued for 3 weeks and after that she will be continued on Plavix only.  Started on Lipitor.  Neurology is recommending TEE and  loop recorder placement to rule out cardiac thrombus and  paroxysmal A. Fib. Both of these will be done as an outpatient.  Message has been left for cardiology in basket.  Assessment & Plan:   Principal Problem:   CVA (cerebral vascular accident) ([place]) Active Problems:   Left-sided weakness   Anxiety   OSA on CPAP   Essential hypertension   Diabetes mellitus type 2 in obese [facility])   Dysphagia   Dysphagiaandleft-sided weakness2/2CVA:Patient presents with mild weakness on the left side, difficulty word finding, mild left-sided facial droop.  Most of  these symptoms have resolved this morning.  MRI  findings as above. Echocardiogram has been ordered. Hemoglobin A1c of 7.1. Continue aspirin and Plavix for 3 weeks and then continue only on Plavix.-Atorvastatin80 mg PO daily She will follow-up with neurology as an outpatient. Outpatient TEE and loop recorder placement to rule out cardiac thrombus and paroxysmal A. Fib. Needs to be seen by Speech today.  Essential hypertension:Initial blood pressures elevated up to 211/98. Blood pressure has improved this morning.  Resume home medications on discharge.  Diabetes mellitus type 2:Patient's hemoglobin A1c noted tobe 7.1.She is on metformin at home.  Follow-up hemoglobin A1c in 3 months.  Anxiety:Ativanprnanxiety  Hyperlipidemia:Atorvastatin 80mg  daily  Obstructive sleep apnea:Patient on CPAP at home.  Obesity:BMI 35.67  Nose bleed: Most likely associated with  dry nose from CPAP.  Will order for humidifier and Afrin spray.    DVT prophylaxis:SCD Code Status: Full Family Communication: Daughter present at the bedside Disposition Plan: To home tomorrow   Consultants:  neurology  Procedures: None  Antimicrobials: None  Subjective: Patient seen and examined the bedside this morning.  Remains comfortable.  She had some weakness on the left side and slurred speech which have improved significantly this morning.  Complains of dry nose.  Had some bleeding from her nostrils this afternoon.  Hemodynamically stable.  Waiting for echocardiogram.  Objective: Vitals:   09/30/17 0246 09/30/17 0447 09/30/17 0726 09/30/17 1230  BP: (!) 158/83 134/75 (!) 160/81 (!) 159/90  Pulse: 84 76 72 87  Resp: 18  15   Temp: 98 F (36.7 C) 97.8 F (36.6 C) 98 F (36.7 C) 97.9 F (36.6 C)  TempSrc: Oral Oral  Oral Oral  SpO2: 98% 99%  98%  Weight:      Height:        Intake/Output Summary (Last 24 hours) at 09/30/2017 1424 Last data filed at 09/29/2017 1933 Gross per 24 hour  Intake -  Output 200 ml  Net -200 ml   Filed  Weights   09/29/17 1657  Weight: 88.5 kg (195 lb)    Examination:  General exam: Appears calm and comfortable ,Not in distress,average built HEENT:PERRL,Oral mucosa moist, Ear/Nose normal on gross exam Respiratory system: Bilateral equal air entry, normal vesicular breath sounds, no wheezes or crackles  Cardiovascular system: S1 & S2 heard, RRR. No JVD, murmurs, rubs, gallops or clicks. No pedal edema. Gastrointestinal system: Abdomen is nondistended, soft and nontender. No organomegaly or masses felt. Normal bowel sounds heard. Central nervous system: Alert and oriented.  Probably very mild weakness on the left side Extremities: No edema, no clubbing ,no cyanosis, distal peripheral pulses palpable. Skin: No rashes, lesions or ulcers,no icterus ,no pallor MSK: Normal muscle bulk,tone ,power Psychiatry: Judgement and insight appear normal. Mood & affect appropriate.     Data Reviewed: I have personally reviewed following labs and imaging studies  CBC: Recent Labs  Lab 09/29/17 1700 09/29/17 1712 09/30/17 0836  WBC 10.5  --  9.7  NEUTROABS 7.2  --  6.5  HGB 13.3 14.3 13.8  HCT 42.2 42.0 43.8  MCV 89.8  --  89.4  PLT 231  --  161   Basic Metabolic Panel: Recent Labs  Lab 09/29/17 1700 09/29/17 1712 09/30/17 0836  NA 139 138 140  K 4.6 4.5 4.4  CL 104 106 102  CO2 25  --  25  GLUCOSE 144* 141* 185*  BUN 10 11 10   CREATININE 0.72 0.60 0.77  CALCIUM 10.1  --  9.8   GFR: Estimated Creatinine Clearance: 69.6 mL/min (by C-G formula based on SCr of 0.77 mg/dL). Liver Function Tests: Recent Labs  Lab 09/29/17 1700  AST 23  ALT 22  ALKPHOS 101  BILITOT 0.5  PROT 7.5  ALBUMIN 4.2   No results for input(s): LIPASE, AMYLASE in the last 168 hours. No results for input(s): AMMONIA in the last 168 hours. Coagulation Profile: Recent Labs  Lab 09/29/17 1700  INR 0.99   Cardiac Enzymes: No results for input(s): CKTOTAL, CKMB, CKMBINDEX, TROPONINI in the last 168  hours. BNP (last 3 results) No results for input(s): PROBNP in the last 8760 hours. HbA1C: Recent Labs    09/30/17 0836  HGBA1C 7.1*   CBG: Recent Labs  Lab 09/29/17 2130 09/30/17 0632 09/30/17 1224  GLUCAP 132* 157* 163*   Lipid Profile: Recent Labs    09/30/17 0836  CHOL 158  HDL 41  LDLCALC 83  TRIG 169*  CHOLHDL 3.9   Thyroid Function Tests: Recent Labs    09/29/17 2055  TSH 1.502   Anemia Panel: No results for input(s): VITAMINB12, FOLATE, FERRITIN, TIBC, IRON, RETICCTPCT in the last 72 hours. Sepsis Labs: No results for input(s): PROCALCITON, LATICACIDVEN in the last 168 hours.  No results found for this or any previous visit (from the past 240 hour(s)).       Radiology Studies: Ct Angio Head W Or Wo Contrast  Result Date: 09/29/2017 CLINICAL DATA:  Transient speech difficulties this morning. Dysphagia and leg heaviness. Suspect stroke. History of hypertension, diabetes. EXAM: CT ANGIOGRAPHY HEAD AND NECK TECHNIQUE: Multidetector CT imaging of the head and neck was performed using the standard protocol during bolus  administration of intravenous contrast. Multiplanar CT image reconstructions and MIPs were obtained to evaluate the vascular anatomy. Carotid stenosis measurements (when applicable) are obtained utilizing NASCET criteria, using the distal internal carotid diameter as the denominator. CONTRAST:  13mL ISOVUE-370 IOPAMIDOL (ISOVUE-370) INJECTION 76% COMPARISON:  CT neck January 09, 2015. FINDINGS: CT HEAD FINDINGS BRAIN: No intraparenchymal hemorrhage, mass effect nor midline shift. The ventricles and sulci are normal for age. No acute large vascular territory infarcts. No abnormal extra-axial fluid collections. Basal cisterns are patent. Punctate dural calcification anterior falx. VASCULAR: Mild calcific atherosclerosis of the carotid siphons. SKULL: No skull fracture. No significant scalp soft tissue swelling. SINUSES/ORBITS: The mastoid air-cells and  included paranasal sinuses are well-aerated.The included ocular globes and orbital contents are non-suspicious. OTHER: None. CTA NECK FINDINGS: AORTIC ARCH: Normal appearance of the thoracic arch, normal branch pattern. Mild calcific atherosclerosis aortic arch. The origins of the innominate, left Common carotid artery and subclavian artery are widely patent. RIGHT CAROTID SYSTEM: Common carotid artery is patent. Retropharyngeal carotid bifurcation without hemodynamically significant stenosis by NASCET criteria. Normal appearance of the internal carotid artery. LEFT CAROTID SYSTEM: Common carotid artery is patent. Normal appearance of the carotid bifurcation without hemodynamically significant stenosis by NASCET criteria. Normal appearance of the internal carotid artery. VERTEBRAL ARTERIES:Left vertebral artery is dominant. Normal appearance of the vertebral arteries, widely patent. SKELETON: No acute osseous process though bone windows have not been submitted. Coarse calcific enthesopathy longus coli insertion. Moderate to severe C4-5, moderate C5-6 and C6-7 degenerative discs. OTHER NECK: Soft tissues of the neck are nonacute though, not tailored for evaluation. New 12 mm hypodense LEFT thyroid nodule. No routine indicated follow-up; this follows ACR consensus guidelines: Managing Incidental Thyroid Nodules Detected on Imaging: White Paper of the ACR Incidental Thyroid Findings Committee. J Am Coll Radiol 2015; 12:143-150. UPPER CHEST: Included lung apices are clear. No superior mediastinal lymphadenopathy. CTA HEAD FINDINGS: ANTERIOR CIRCULATION: Patent cervical internal carotid arteries, petrous, cavernous and supra clinoid internal carotid arteries. Patent anterior communicating artery. Patent anterior and middle cerebral arteries, mild luminal irregularity compatible with atherosclerosis. No large vessel occlusion, significant stenosis, contrast extravasation or aneurysm. POSTERIOR CIRCULATION: Patent  vertebral arteries, vertebrobasilar junction and basilar artery, as well as main branch vessels. Patent posterior cerebral arteries. LEFT PCOM insertional infundibulum. Moderate tandem stenosis LEFT posterior cerebral artery. No large vessel occlusion, contrast extravasation or aneurysm. VENOUS SINUSES: Major dural venous sinuses are patent though not tailored for evaluation on this angiographic examination. ANATOMIC VARIANTS: None. DELAYED PHASE: No abnormal intracranial enhancement. MIP images reviewed. IMPRESSION: CT HEAD: 1. Normal CT HEAD with without contrast for age. CTA NECK: 1. No hemodynamically significant stenosis ICA. 2. Patent vertebral arteries. CTA HEAD: 1. No emergent large vessel occlusion or flow-limiting stenosis. 2. Moderate stenosis LEFT PCA most compatible with atherosclerosis. Aortic Atherosclerosis (ICD10-I70.0). Electronically Signed   By: Elon Alas M.D.   On: 09/29/2017 18:50   Ct Angio Neck W Or Wo Contrast  Result Date: 09/29/2017 CLINICAL DATA:  Transient speech difficulties this morning. Dysphagia and leg heaviness. Suspect stroke. History of hypertension, diabetes. EXAM: CT ANGIOGRAPHY HEAD AND NECK TECHNIQUE: Multidetector CT imaging of the head and neck was performed using the standard protocol during bolus administration of intravenous contrast. Multiplanar CT image reconstructions and MIPs were obtained to evaluate the vascular anatomy. Carotid stenosis measurements (when applicable) are obtained utilizing NASCET criteria, using the distal internal carotid diameter as the denominator. CONTRAST:  26mL ISOVUE-370 IOPAMIDOL (ISOVUE-370) INJECTION 76% COMPARISON:  CT neck January 09, 2015. FINDINGS: CT HEAD FINDINGS BRAIN: No intraparenchymal hemorrhage, mass effect nor midline shift. The ventricles and sulci are normal for age. No acute large vascular territory infarcts. No abnormal extra-axial fluid collections. Basal cisterns are patent. Punctate dural calcification  anterior falx. VASCULAR: Mild calcific atherosclerosis of the carotid siphons. SKULL: No skull fracture. No significant scalp soft tissue swelling. SINUSES/ORBITS: The mastoid air-cells and included paranasal sinuses are well-aerated.The included ocular globes and orbital contents are non-suspicious. OTHER: None. CTA NECK FINDINGS: AORTIC ARCH: Normal appearance of the thoracic arch, normal branch pattern. Mild calcific atherosclerosis aortic arch. The origins of the innominate, left Common carotid artery and subclavian artery are widely patent. RIGHT CAROTID SYSTEM: Common carotid artery is patent. Retropharyngeal carotid bifurcation without hemodynamically significant stenosis by NASCET criteria. Normal appearance of the internal carotid artery. LEFT CAROTID SYSTEM: Common carotid artery is patent. Normal appearance of the carotid bifurcation without hemodynamically significant stenosis by NASCET criteria. Normal appearance of the internal carotid artery. VERTEBRAL ARTERIES:Left vertebral artery is dominant. Normal appearance of the vertebral arteries, widely patent. SKELETON: No acute osseous process though bone windows have not been submitted. Coarse calcific enthesopathy longus coli insertion. Moderate to severe C4-5, moderate C5-6 and C6-7 degenerative discs. OTHER NECK: Soft tissues of the neck are nonacute though, not tailored for evaluation. New 12 mm hypodense LEFT thyroid nodule. No routine indicated follow-up; this follows ACR consensus guidelines: Managing Incidental Thyroid Nodules Detected on Imaging: White Paper of the ACR Incidental Thyroid Findings Committee. J Am Coll Radiol 2015; 12:143-150. UPPER CHEST: Included lung apices are clear. No superior mediastinal lymphadenopathy. CTA HEAD FINDINGS: ANTERIOR CIRCULATION: Patent cervical internal carotid arteries, petrous, cavernous and supra clinoid internal carotid arteries. Patent anterior communicating artery. Patent anterior and middle cerebral  arteries, mild luminal irregularity compatible with atherosclerosis. No large vessel occlusion, significant stenosis, contrast extravasation or aneurysm. POSTERIOR CIRCULATION: Patent vertebral arteries, vertebrobasilar junction and basilar artery, as well as main branch vessels. Patent posterior cerebral arteries. LEFT PCOM insertional infundibulum. Moderate tandem stenosis LEFT posterior cerebral artery. No large vessel occlusion, contrast extravasation or aneurysm. VENOUS SINUSES: Major dural venous sinuses are patent though not tailored for evaluation on this angiographic examination. ANATOMIC VARIANTS: None. DELAYED PHASE: No abnormal intracranial enhancement. MIP images reviewed. IMPRESSION: CT HEAD: 1. Normal CT HEAD with without contrast for age. CTA NECK: 1. No hemodynamically significant stenosis ICA. 2. Patent vertebral arteries. CTA HEAD: 1. No emergent large vessel occlusion or flow-limiting stenosis. 2. Moderate stenosis LEFT PCA most compatible with atherosclerosis. Aortic Atherosclerosis (ICD10-I70.0). Electronically Signed   By: Elon Alas M.D.   On: 09/29/2017 18:50   Mr Brain Wo Contrast  Result Date: 09/30/2017 CLINICAL DATA:  Initial evaluation for possible stroke, focal neural deficit. EXAM: MRI HEAD WITHOUT CONTRAST TECHNIQUE: Multiplanar, multiecho pulse sequences of the brain and surrounding structures were obtained without intravenous contrast. COMPARISON:  Prior CTA from 09/29/2017 FINDINGS: Brain: Cerebral volume within normal limits for age. Few scattered T2/FLAIR hyperintense foci noted within the periventricular deep white matter both cerebral hemispheres, nonspecific, but most like related chronic small vessel ischemic change, felt to be within normal limits for age. Approximate 2.3 cm curvilinear acute ischemic infarct seen involving the right basal ganglia, extending from the right lentiform nucleus into the right caudate (series 7, image 49). No associated mass effect or  hemorrhage. No other evidence for acute or subacute ischemia. Gray-white matter differentiation otherwise maintained. No other areas of chronic infarction. No acute or chronic intracranial hemorrhage. 13 mm extra-axial  lesion overlying the left frontal convexity most likely reflects a small meningioma (series 11, image 14). No associated mass effect or edema. No other mass lesion. No midline shift. No hydrocephalus. No extra-axial fluid collection. Pituitary gland normal. Vascular: Major intracranial vascular flow voids are maintained. Skull and upper cervical spine: Craniocervical junction normal. Cervical spondylolysis noted at C4-5 without significant stenosis. Subcentimeter sclerotic lesion within the C3 vertebral body most likely reflects a small bone island, better evaluated on prior CT. No other focal marrow replacing lesion. Scalp soft tissues within normal limits. Sinuses/Orbits: Globes and orbital soft tissues demonstrate no acute finding. Paranasal sinuses largely clear. No mastoid effusion. Inner ear structures normal. Other: None. IMPRESSION: 1. 2.3 cm acute ischemic nonhemorrhagic right basal ganglia lacunar infarct. 2. 13 mm meningioma overlying the left frontal convexity without associated mass effect. 3. Otherwise normal brain MRI for age. Electronically Signed   By: Jeannine Boga M.D.   On: 09/30/2017 04:24        Scheduled Meds: . aspirin EC  81 mg Oral Daily  . atorvastatin  80 mg Oral q1800  . clopidogrel  75 mg Oral Daily  . enoxaparin (LOVENOX) injection  40 mg Subcutaneous Q24H  . insulin aspart  0-5 Units Subcutaneous QHS  . insulin aspart  0-9 Units Subcutaneous TID WC  . oxybutynin  5 mg Oral Daily   Continuous Infusions:   LOS: 0 days    Time spent: 25 mins.More than 50% of that time was spent in counseling and/or coordination of care.      Shelly Coss, MD [facility] Pager [phone]  If 7PM-7AM, please contact  night-coverage [email or web address] Password [facility] 09/30/2017, 2:24 PM

## 2017-09-30 NOTE — Progress Notes (Signed)
Stroke Team Progress Note  HPI: ( Dr Rory Percy) Christine Navarro is a 67 y.o. female who has a past medical history of diabetes, hypertension, hyperlipidemia, sleep apnea and anxiety, who was in her usual state of health when she went to bed at 10 PM 09/28/17 night.  She woke up this morning at 7:30 AM and describes that she felt "not right".  She describes this as feeling of generalized malaise as well as difficulty with word finding.  She also felt that she had difficulty walking and felt that both her legs were weaker than usual.  Her daughter noticed that her speech was not normal and she was slow in responding to questions.  Patient also reported difficulty in swallowing which has since resolved.  She was noted to have mild left facial droop and mild left-sided weakness on initial exam in the ED.  She was outside the window for IV TPA hence code stroke was not activated.  She had no cortical signs for which no IR code stroke was paged. She reports being suffering with sinus congestion for the past few days.  Denies fevers or chills.  Denies chest pain shortness of breath nausea vomiting.  Denies easy bleeding bruising. Never had stroke in the past.  Does not have a history of heart disease. Is a non-smoker. Family history of MI in siblings.   LKW: 10 PM 09/28/2017 tpa given?: no, outside window Premorbid modified Rankin scale (mRS): 0   SUBJECTIVE Patient's daughter is in the room. I reviewed personally and detailed history of present illness. Patient states his speech is recovered but not back to baseline. MRI scan shows a large right basal ganglia infarct. CT abdomen shows no significant intra-or extracranial stenosis. Incidental small left frontal meningioma is noted. LDL cholesterol was 83 mg percent.HbA1c was  7.1. Echocardiogram is pending. Patient states she wants to go home and is willing to come back for outpatient TEE and loop recorder HPI** OBJECTIVE Most recent Vital Signs: Temp: 98 F  (36.7 C) (07/20 0726) Temp Source: Oral (07/20 0726) BP: 160/81 (07/20 0726) Pulse Rate: 72 (07/20 0726) Respiratory Rate: 15 O2 Saturdation: 99%  CBG (last 3)  Recent Labs    09/29/17 2130  GLUCAP 132*       Studies:  CT : Normal CT HEAD with without contrast for age. MRI : 1. 2.3 cm acute ischemic nonhemorrhagic right basal ganglia lacunar infarct. 2. 13 mm meningioma overlying the left frontal convexity without associated mass effect. CTA NECK: 1. No hemodynamically significant stenosis ICA. 2. Patent vertebral arteries. CTA HEAD: 1. No emergent large vessel occlusion or flow-limiting stenosis. 2. Moderate stenosis LEFT PCA most compatible with atherosclerosis.  ECHO pending LDL 83 mg% HbA1c 7.1  Physical Exam:    Pleasant middle-age Caucasian lady currently not in distress. . Afebrile. Head is nontraumatic. Neck is supple without bruit.    Cardiac exam no murmur or gallop. Lungs are clear to auscultation. Distal pulses are well felt. Neurological Exam :  Awake alert oriented x 3 normal speech and language. Mild left lower face asymmetry. Tongue midline. No drift. Mild diminished fine finger movements on left. Orbits right over left upper extremity. Mild left grip weak.. Normal sensation . Normal coordination. NIHSS 1 ASSESSMENT Ms. SRIYA KROEZE is a 67 y.o. female with  Slurred speech and a facial droop due to large right basal ganglia infarct etiology indeterminate. Size of the infarct is too large to be called lacunar and workup for embolic sources necessary.  Vascular risk factors of hyperlipidemia, diabetes, died obesity and sleep apnea   Hospital day # 0  TREATMENT/PLAN  I have personally examined this patient, reviewed notes, independently viewed imaging studies, participated in medical decision making and plan of care.ROS completed by me personally and pertinent positives fully documented  I have made any additions or clarifications directly to the above  note. Recommend dual antiplatelet therapy aspirin and Plavix for 3 weeks followed by Plavix alone. Patient will need TEE and loop recorder after echocardiogram results but this can be arranged as an outpatient. Have discussed with Dr. Crissie Sickles electrophysiologist on call who will arrange this. Discussed with Dr. Tawanna Solo. I spent  35 minutes in total face-to-face time with the patient, more than 50% of which was spent in counseling and coordination of care, reviewing test results, reviewing medication and discussing or reviewing the diagnosis of cryptogenic stroke    , the prognosis and treatment options.   Christine Contras, Christine Navarro Medical Director [facility] Pager: [phone] 09/30/2017 12:11 PM

## 2017-09-30 NOTE — Evaluation (Signed)
Clinical/Bedside Swallow Evaluation Patient Details  Name: Christine Navarro MRN: [id number] Date of Birth: Dec 17, 1950  Today's Date: 09/30/2017 Time: SLP Start Time (ACUTE ONLY): 68 SLP Stop Time (ACUTE ONLY): 1720 SLP Time Calculation (min) (ACUTE ONLY): 19 min  Past Medical History:  Past Medical History:  Diagnosis Date  . Anginal pain ([place])    evaluated by Dr Wynonia Lawman, records on chart   . Anxiety   . Arthritis   . Asthma   . Basal cell adenocarcinoma    nose  . Diabetes mellitus without complication ([place])   . History of kidney stones   . Hypertension   . Pneumonia   . PONV (postoperative nausea and vomiting)   . Sleep apnea    mild per pt-  no CPAP   Past Surgical History:  Past Surgical History:  Procedure Laterality Date  . ABDOMINAL HYSTERECTOMY    . APPENDECTOMY    . BREAST BIOPSY    . BREAST EXCISIONAL BIOPSY    . BREAST SURGERY     x 3  . CHOLECYSTECTOMY    . CYSTOSCOPY WITH RETROGRADE PYELOGRAM, URETEROSCOPY AND STENT PLACEMENT Right 12/23/2015   Procedure: CYSTOSCOPY WITH RETROGRADE PYELOGRAM, URETEROSCOPY AND STENT PLACEMENT;  Surgeon: Alexis Frock, MD;  Location: [facility];  Service: Urology;  Laterality: Right;  . DIAGNOSTIC LAPAROSCOPY    . HOLMIUM LASER APPLICATION Right 02/54/2706   Procedure: HOLMIUM LASER APPLICATION;  Surgeon: Alexis Frock, MD;  Location: [facility];  Service: Urology;  Laterality: Right;  . salpingectomy with oophorectomy     HPI:  67 yo female presenting with mild weakness on the left side, difficulty word finding, mild left-sided facial droop.  MRI showing acute ischemic nonhemorrhagic right basal ganglia lacunar infarct. Passed stroke swallow screen however RN reported coughing with meal.   Assessment / Plan / Recommendation Clinical Impression   Patient presents with what appears to be functional oropharyngeal swallow with adequate airway protection. No focal cranial nerve deficits noted. Speech is clear without dysarthria, though  she does report persisting wordfinding difficulties. She does have delayed coughing after solids, and reports globus sensation. She reports she has had trouble with acid reflux in the past; she avoids spicy foods and takes Tums occasionally to manage this. I suspect primary esophageal vs oropharyngeal dysphagia. Education provided re: esophageal precautions (upright posture during and for 30 min after meals), slow rate, small bites and sips, thorough mastication, follow solids with liquids. SLP will follow briefly for tolerance and to determine need for further esophageal w/u. May continue regular diet with thin liquids, meds whole with liquids. D/w RN.    SLP Visit Diagnosis: Dysphagia, unspecified (R13.10)    Aspiration Risk  Mild aspiration risk    Diet Recommendation Regular;Thin liquid   Liquid Administration via: Cup;Straw Medication Administration: Whole meds with liquid Supervision: Patient able to self feed Compensations: Slow rate;Small sips/bites Postural Changes: Seated upright at 90 degrees;Remain upright for at least 30 minutes after po intake    Other  Recommendations Oral Care Recommendations: Oral care BID   Follow up Recommendations Other (comment)(tbd)      Frequency and Duration min 1 x/week  1 week       Prognosis Prognosis for Safe Diet Advancement: Good      Swallow Study   General Date of Onset: 09/29/17 HPI: 67 yo female presenting with mild weakness on the left side, difficulty word finding, mild left-sided facial droop.  MRI showing acute ischemic nonhemorrhagic right basal ganglia lacunar infarct. Passed stroke  swallow screen however RN reported coughing with meal. Type of Study: Bedside Swallow Evaluation Previous Swallow Assessment: none in chart Diet Prior to this Study: Regular;Thin liquids Temperature Spikes Noted: No Respiratory Status: Room air History of Recent Intubation: No Behavior/Cognition: Cooperative;Alert Oral Cavity Assessment:  Within Functional Limits Oral Care Completed by SLP: No Oral Cavity - Dentition: Adequate natural dentition Vision: Functional for self-feeding Self-Feeding Abilities: Able to feed self Patient Positioning: Upright in bed Baseline Vocal Quality: Normal Volitional Cough: Strong Volitional Swallow: Able to elicit    Oral/Motor/Sensory Function Overall Oral Motor/Sensory Function: Within functional limits   Ice Chips Ice chips: Within functional limits   Thin Liquid Thin Liquid: Within functional limits Presentation: Cup;Straw;Self Fed    Nectar Thick Nectar Thick Liquid: Not tested   Honey Thick Honey Thick Liquid: Not tested   Puree Puree: Within functional limits Presentation: Self Fed;Spoon   Solid   GO   Solid: Impaired Pharyngeal Phase Impairments: Cough - Delayed       Deneise Lever, [place], CCC-SLP Speech-Language Pathologist [place] 09/30/2017,7:01 PM

## 2017-09-30 NOTE — Progress Notes (Signed)
Patient refused to wear home CPAP. She states that she has a bloody nose. RT will continue to monitor as needed.

## 2017-09-30 NOTE — Progress Notes (Signed)
Pt left for MRI.

## 2017-09-30 NOTE — Discharge Summary (Signed)
Physician Discharge Summary  Christine Navarro BSJ:[id number] DOB: 01/10/1951 DOA: 09/29/2017  PCP: Mayra Neer, MD  Admit date: 09/29/2017 Discharge date: 10/01/2017  Admitted From: Home Disposition:  Home  Discharge Condition:Stable CODE STATUS:FULL Diet recommendation: Heart Healthy   Brief/Interim Summary:  Admission H and P: HPI: Christine Navarro is a 67 y.o. right hand dominate female with medical history significant of HTN, HLD, DM type II, OSA on CPAP, anxiety; who woke up around 7:30 a.m. not feeling well.  Last noted to be normal prior to going to bed around 10 PM last night.  She reported feeling as though the left side of her face was drawing up and her speech was slurred.  While trying to ambulate patient reported feeling as though her legs were heavy and weak most noticeably on the left side.  Associated symptoms included complaints of feeling off balance, sinus congestion, and difficulty word finding.  More chronic complaints include intermittent palpitations and diarrhea related to irritable bowel syndromes.  Denies any significant fever, chills, nausea, vomiting, chest pain, shortness of breath, cough, easy bruising, history of stroke, or heart disease.  ED Course: Patient presented was evaluated by neurology but was out of the window for TPA stroke code was not activated.  CT scan of the brain showed no acute abnormalities.  Upon admission into the emergency department patient was noted to be afebrile, blood pressure 157/62-211/98, pulse 75-1 03, respiration 14-22, and O2 saturation maintained on room air.  Labs revealed relatively normal CBC, CMP, PT/INR, and troponin. CT angiogram of the head and neck did not show any acute signs of stroke. Dr. Rory Percy of neurology evaluated the patient.  Patient was given 324 mg of aspirin. [facility] called to admit for completion of stroke work-up.  Hospital Course: Patient's hospital course remained stable.  Her weakness on presentation has  improved.  She does not have any facial droop and her speech is clear this morning.  Patient evaluated by physical therapy and recommended  outpatient PT/OT .Patient was evaluated by neurology . MRI brain showed 2.3 cm acute ischemic nonhemorrhagic right basal ganglia lacunar Infarct. 13 mm meningioma overlying the left frontal convexity without associated mass effect.  She has been started on aspirin and Plavix which will be continued for 3 weeks and after that she will be continued on Plavix only.  Started on Lipitor.  Neurology is recommending TEE and  loop recorder placement to rule out cardiac thrombus and  paroxysmal A. Fib.  Both of these will be done as an outpatient.  Message has been left for cardiology in basket. Patient is stable for discharge to home today.  Following problems were addressed during her hospitalization:  Dysphagia and left-sided weakness 2/2 CVA : Patient presents with mild weakness on the left side, difficulty word finding, mild left-sided facial droop.  Most of  these symptoms have resolved this morning.  MRI findings as above. Echocardiogram showed ejection fraction of 55% -65%, mild left ventricular hypertrophy, no diagnostic regional wall motion abnormality, no cardiac source of embolism. Hemoglobin A1c of 7.1. Continue aspirin and Plavix for 3 weeks and then continue only on Plavix,Atorvastatin 80 mg PO daily She will follow-up with neurology as an outpatient . Outpatient TEE and loop recorder placement to rule out cardiac thrombus and paroxysmal A. fib.  Essential hypertension: Initial blood pressures elevated up to 211/98. Blood pressure has improved this morning.  Resume home medications on discharge..  Diabetes mellitus type 2: Patient's hemoglobin A1c noted tobe 7.1.She is  on metformin at home.  Follow-up hemoglobin A1c in 3 months.   Anxiety:Ativan prn anxiety  Hyperlipidemia:Atorvastatin 80 mg daily  Obstructive sleep apnea: Patient on CPAP at  home.  Obesity: BMI 35.67    Discharge Diagnoses:  Principal Problem:   CVA (cerebral vascular accident) ([place]) Active Problems:   Left-sided weakness   Anxiety   OSA on CPAP   Essential hypertension   Diabetes mellitus type 2 in obese [facility])   Dysphagia    Discharge Instructions  Discharge Instructions    Ambulatory referral to Occupational Therapy   Complete by:  As directed    Ambulatory referral to Physical Therapy   Complete by:  As directed    Diet - low sodium heart healthy   Complete by:  As directed    Discharge instructions   Complete by:  As directed    1) Take prescribed medications as instructed.  Continue aspirin and Plavix for 3 weeks.  Then stop aspirin and  continue on Plavix. 2) Follow up with your PCP in a week. 3) Check your lipid panel and hemoglobin A1c in 3 months. 4) Follow up with neurology as an outpatient in 4 weeks.  Name and number of the provider has been attached. 5)Follow up with cardiology for outpatient TEE and loop recorder placement.   Increase activity slowly   Complete by:  As directed      Allergies as of 10/01/2017      Reactions   Ace Inhibitors Cough   Ciprofloxacin Other (See Comments)   Muscle Cramps   Sular [nisoldipine Er] Other (See Comments)   HEART RACING   Vitamin D Analogs Nausea And Vomiting   Welchol [colesevelam Hcl] Nausea And Vomiting   Erythromycin Nausea And Vomiting   Sulfa Antibiotics Rash   Tape Rash   Use paper tape please      Medication List    STOP taking these medications   rosuvastatin 40 MG tablet Commonly known as:  CRESTOR     TAKE these medications   aspirin EC 81 MG tablet Stop after 3 weeks. Then continue only on plavix What changed:    how much to take  how to take this  when to take this  additional instructions   atorvastatin 80 MG tablet Commonly known as:  LIPITOR Take 1 tablet (80 mg total) by mouth daily at 6 PM.   clopidogrel 75 MG tablet Commonly known as:   PLAVIX Take 1 tablet (75 mg total) by mouth daily.   LORazepam 1 MG tablet Commonly known as:  ATIVAN Take 0.5 mg by mouth 2 (two) times daily as needed for anxiety or sleep.   metFORMIN 500 MG tablet Commonly known as:  GLUCOPHAGE Take 500 mg by mouth 2 (two) times daily.   metoprolol tartrate 25 MG tablet Commonly known as:  LOPRESSOR Take 25 mg by mouth 2 (two) times daily.   oxybutynin 5 MG 24 hr tablet Commonly known as:  DITROPAN-XL Take 5 mg by mouth daily.   oxyCODONE-acetaminophen 5-325 MG tablet Commonly known as:  PERCOCET/ROXICET Take 1-2 tablets by mouth every 4 (four) hours as needed for severe pain.   senna-docusate 8.6-50 MG tablet Commonly known as:  Senokot-S Take 1 tablet by mouth 2 (two) times daily. While taking pain meds to prevent constipation.      Follow-up Information    [facility].   Specialty:  Rehabilitation Why:  For outpatient PT and OT. They will call you in  the next 3-5 days to set up your first appointment Contact information: [address] [place]       Garvin Fila, MD. Schedule an appointment as soon as possible for a visit in 4 week(s).   Specialties:  Neurology, Radiology Contact information: [address] [place]  [address] [phone]          Allergies  Allergen Reactions  . Ace Inhibitors Cough  . Ciprofloxacin Other (See Comments)    Muscle Cramps  . Sular [Nisoldipine Er] Other (See Comments)    HEART RACING  . Vitamin D Analogs Nausea And Vomiting  . Welchol [Colesevelam Hcl] Nausea And Vomiting  . Erythromycin Nausea And Vomiting  . Sulfa Antibiotics Rash  . Tape Rash    Use paper tape please    Consultations:  Neurology   Procedures/Studies: Ct Angio Head W Or Wo Contrast  Result Date: 09/29/2017 CLINICAL DATA:  Transient speech difficulties this morning.  Dysphagia and leg heaviness. Suspect stroke. History of hypertension, diabetes. EXAM: CT ANGIOGRAPHY HEAD AND NECK TECHNIQUE: Multidetector CT imaging of the head and neck was performed using the standard protocol during bolus administration of intravenous contrast. Multiplanar CT image reconstructions and MIPs were obtained to evaluate the vascular anatomy. Carotid stenosis measurements (when applicable) are obtained utilizing NASCET criteria, using the distal internal carotid diameter as the denominator. CONTRAST:  38mL ISOVUE-370 IOPAMIDOL (ISOVUE-370) INJECTION 76% COMPARISON:  CT neck January 09, 2015. FINDINGS: CT HEAD FINDINGS BRAIN: No intraparenchymal hemorrhage, mass effect nor midline shift. The ventricles and sulci are normal for age. No acute large vascular territory infarcts. No abnormal extra-axial fluid collections. Basal cisterns are patent. Punctate dural calcification anterior falx. VASCULAR: Mild calcific atherosclerosis of the carotid siphons. SKULL: No skull fracture. No significant scalp soft tissue swelling. SINUSES/ORBITS: The mastoid air-cells and included paranasal sinuses are well-aerated.The included ocular globes and orbital contents are non-suspicious. OTHER: None. CTA NECK FINDINGS: AORTIC ARCH: Normal appearance of the thoracic arch, normal branch pattern. Mild calcific atherosclerosis aortic arch. The origins of the innominate, left Common carotid artery and subclavian artery are widely patent. RIGHT CAROTID SYSTEM: Common carotid artery is patent. Retropharyngeal carotid bifurcation without hemodynamically significant stenosis by NASCET criteria. Normal appearance of the internal carotid artery. LEFT CAROTID SYSTEM: Common carotid artery is patent. Normal appearance of the carotid bifurcation without hemodynamically significant stenosis by NASCET criteria. Normal appearance of the internal carotid artery. VERTEBRAL ARTERIES:Left vertebral artery is dominant. Normal appearance of  the vertebral arteries, widely patent. SKELETON: No acute osseous process though bone windows have not been submitted. Coarse calcific enthesopathy longus coli insertion. Moderate to severe C4-5, moderate C5-6 and C6-7 degenerative discs. OTHER NECK: Soft tissues of the neck are nonacute though, not tailored for evaluation. New 12 mm hypodense LEFT thyroid nodule. No routine indicated follow-up; this follows ACR consensus guidelines: Managing Incidental Thyroid Nodules Detected on Imaging: White Paper of the ACR Incidental Thyroid Findings Committee. J Am Coll Radiol 2015; 12:143-150. UPPER CHEST: Included lung apices are clear. No superior mediastinal lymphadenopathy. CTA HEAD FINDINGS: ANTERIOR CIRCULATION: Patent cervical internal carotid arteries, petrous, cavernous and supra clinoid internal carotid arteries. Patent anterior communicating artery. Patent anterior and middle cerebral arteries, mild luminal irregularity compatible with atherosclerosis. No large vessel occlusion, significant stenosis, contrast extravasation or aneurysm. POSTERIOR CIRCULATION: Patent vertebral arteries, vertebrobasilar junction and basilar artery, as well as main branch vessels. Patent posterior cerebral arteries. LEFT PCOM insertional infundibulum. Moderate tandem stenosis LEFT  posterior cerebral artery. No large vessel occlusion, contrast extravasation or aneurysm. VENOUS SINUSES: Major dural venous sinuses are patent though not tailored for evaluation on this angiographic examination. ANATOMIC VARIANTS: None. DELAYED PHASE: No abnormal intracranial enhancement. MIP images reviewed. IMPRESSION: CT HEAD: 1. Normal CT HEAD with without contrast for age. CTA NECK: 1. No hemodynamically significant stenosis ICA. 2. Patent vertebral arteries. CTA HEAD: 1. No emergent large vessel occlusion or flow-limiting stenosis. 2. Moderate stenosis LEFT PCA most compatible with atherosclerosis. Aortic Atherosclerosis (ICD10-I70.0). Electronically  Signed   By: Elon Alas M.D.   On: 09/29/2017 18:50   Ct Angio Neck W Or Wo Contrast  Result Date: 09/29/2017 CLINICAL DATA:  Transient speech difficulties this morning. Dysphagia and leg heaviness. Suspect stroke. History of hypertension, diabetes. EXAM: CT ANGIOGRAPHY HEAD AND NECK TECHNIQUE: Multidetector CT imaging of the head and neck was performed using the standard protocol during bolus administration of intravenous contrast. Multiplanar CT image reconstructions and MIPs were obtained to evaluate the vascular anatomy. Carotid stenosis measurements (when applicable) are obtained utilizing NASCET criteria, using the distal internal carotid diameter as the denominator. CONTRAST:  51mL ISOVUE-370 IOPAMIDOL (ISOVUE-370) INJECTION 76% COMPARISON:  CT neck January 09, 2015. FINDINGS: CT HEAD FINDINGS BRAIN: No intraparenchymal hemorrhage, mass effect nor midline shift. The ventricles and sulci are normal for age. No acute large vascular territory infarcts. No abnormal extra-axial fluid collections. Basal cisterns are patent. Punctate dural calcification anterior falx. VASCULAR: Mild calcific atherosclerosis of the carotid siphons. SKULL: No skull fracture. No significant scalp soft tissue swelling. SINUSES/ORBITS: The mastoid air-cells and included paranasal sinuses are well-aerated.The included ocular globes and orbital contents are non-suspicious. OTHER: None. CTA NECK FINDINGS: AORTIC ARCH: Normal appearance of the thoracic arch, normal branch pattern. Mild calcific atherosclerosis aortic arch. The origins of the innominate, left Common carotid artery and subclavian artery are widely patent. RIGHT CAROTID SYSTEM: Common carotid artery is patent. Retropharyngeal carotid bifurcation without hemodynamically significant stenosis by NASCET criteria. Normal appearance of the internal carotid artery. LEFT CAROTID SYSTEM: Common carotid artery is patent. Normal appearance of the carotid bifurcation without  hemodynamically significant stenosis by NASCET criteria. Normal appearance of the internal carotid artery. VERTEBRAL ARTERIES:Left vertebral artery is dominant. Normal appearance of the vertebral arteries, widely patent. SKELETON: No acute osseous process though bone windows have not been submitted. Coarse calcific enthesopathy longus coli insertion. Moderate to severe C4-5, moderate C5-6 and C6-7 degenerative discs. OTHER NECK: Soft tissues of the neck are nonacute though, not tailored for evaluation. New 12 mm hypodense LEFT thyroid nodule. No routine indicated follow-up; this follows ACR consensus guidelines: Managing Incidental Thyroid Nodules Detected on Imaging: White Paper of the ACR Incidental Thyroid Findings Committee. J Am Coll Radiol 2015; 12:143-150. UPPER CHEST: Included lung apices are clear. No superior mediastinal lymphadenopathy. CTA HEAD FINDINGS: ANTERIOR CIRCULATION: Patent cervical internal carotid arteries, petrous, cavernous and supra clinoid internal carotid arteries. Patent anterior communicating artery. Patent anterior and middle cerebral arteries, mild luminal irregularity compatible with atherosclerosis. No large vessel occlusion, significant stenosis, contrast extravasation or aneurysm. POSTERIOR CIRCULATION: Patent vertebral arteries, vertebrobasilar junction and basilar artery, as well as main branch vessels. Patent posterior cerebral arteries. LEFT PCOM insertional infundibulum. Moderate tandem stenosis LEFT posterior cerebral artery. No large vessel occlusion, contrast extravasation or aneurysm. VENOUS SINUSES: Major dural venous sinuses are patent though not tailored for evaluation on this angiographic examination. ANATOMIC VARIANTS: None. DELAYED PHASE: No abnormal intracranial enhancement. MIP images reviewed. IMPRESSION: CT HEAD: 1. Normal CT HEAD with without  contrast for age. CTA NECK: 1. No hemodynamically significant stenosis ICA. 2. Patent vertebral arteries. CTA HEAD: 1.  No emergent large vessel occlusion or flow-limiting stenosis. 2. Moderate stenosis LEFT PCA most compatible with atherosclerosis. Aortic Atherosclerosis (ICD10-I70.0). Electronically Signed   By: Elon Alas M.D.   On: 09/29/2017 18:50   Mr Brain Wo Contrast  Result Date: 09/30/2017 CLINICAL DATA:  Initial evaluation for possible stroke, focal neural deficit. EXAM: MRI HEAD WITHOUT CONTRAST TECHNIQUE: Multiplanar, multiecho pulse sequences of the brain and surrounding structures were obtained without intravenous contrast. COMPARISON:  Prior CTA from 09/29/2017 FINDINGS: Brain: Cerebral volume within normal limits for age. Few scattered T2/FLAIR hyperintense foci noted within the periventricular deep white matter both cerebral hemispheres, nonspecific, but most like related chronic small vessel ischemic change, felt to be within normal limits for age. Approximate 2.3 cm curvilinear acute ischemic infarct seen involving the right basal ganglia, extending from the right lentiform nucleus into the right caudate (series 7, image 49). No associated mass effect or hemorrhage. No other evidence for acute or subacute ischemia. Gray-white matter differentiation otherwise maintained. No other areas of chronic infarction. No acute or chronic intracranial hemorrhage. 13 mm extra-axial lesion overlying the left frontal convexity most likely reflects a small meningioma (series 11, image 14). No associated mass effect or edema. No other mass lesion. No midline shift. No hydrocephalus. No extra-axial fluid collection. Pituitary gland normal. Vascular: Major intracranial vascular flow voids are maintained. Skull and upper cervical spine: Craniocervical junction normal. Cervical spondylolysis noted at C4-5 without significant stenosis. Subcentimeter sclerotic lesion within the C3 vertebral body most likely reflects a small bone island, better evaluated on prior CT. No other focal marrow replacing lesion. Scalp soft tissues  within normal limits. Sinuses/Orbits: Globes and orbital soft tissues demonstrate no acute finding. Paranasal sinuses largely clear. No mastoid effusion. Inner ear structures normal. Other: None. IMPRESSION: 1. 2.3 cm acute ischemic nonhemorrhagic right basal ganglia lacunar infarct. 2. 13 mm meningioma overlying the left frontal convexity without associated mass effect. 3. Otherwise normal brain MRI for age. Electronically Signed   By: Jeannine Boga M.D.   On: 09/30/2017 04:24      Subjective:   Discharge Exam: Vitals:   10/01/17 0617 10/01/17 0752  BP: (!) 153/82 (!) 164/86  Pulse:    Resp: 14   Temp:  98 F (36.7 C)  SpO2:     Vitals:   09/30/17 2044 09/30/17 2343 10/01/17 0617 10/01/17 0752  BP: (!) 158/73 (!) 156/80 (!) 153/82 (!) 164/86  Pulse: 77 92    Resp:  18 14   Temp: 98.6 F (37 C) 98.4 F (36.9 C)  98 F (36.7 C)  TempSrc: Oral Oral  Oral  SpO2:  96%    Weight:      Height:        General: Pt is alert, awake, not in acute distress Cardiovascular: RRR, S1/S2 +, no rubs, no gallops Respiratory: CTA bilaterally, no wheezing, no rhonchi Abdominal: Soft, NT, ND, bowel sounds + Extremities: no edema, no cyanosis    The results of significant diagnostics from this hospitalization (including imaging, microbiology, ancillary and laboratory) are listed below for reference.     Microbiology: No results found for this or any previous visit (from the past 240 hour(s)).   Labs: BNP (last 3 results) No results for input(s): BNP in the last 8760 hours. Basic Metabolic Panel: Recent Labs  Lab 09/29/17 1700 09/29/17 1712 09/30/17 0836  NA 139 138 140  K 4.6 4.5 4.4  CL 104 106 102  CO2 25  --  25  GLUCOSE 144* 141* 185*  BUN 10 11 10   CREATININE 0.72 0.60 0.77  CALCIUM 10.1  --  9.8   Liver Function Tests: Recent Labs  Lab 09/29/17 1700  AST 23  ALT 22  ALKPHOS 101  BILITOT 0.5  PROT 7.5  ALBUMIN 4.2   No results for input(s): LIPASE,  AMYLASE in the last 168 hours. No results for input(s): AMMONIA in the last 168 hours. CBC: Recent Labs  Lab 09/29/17 1700 09/29/17 1712 09/30/17 0836  WBC 10.5  --  9.7  NEUTROABS 7.2  --  6.5  HGB 13.3 14.3 13.8  HCT 42.2 42.0 43.8  MCV 89.8  --  89.4  PLT 231  --  255   Cardiac Enzymes: No results for input(s): CKTOTAL, CKMB, CKMBINDEX, TROPONINI in the last 168 hours. BNP: Invalid input(s): POCBNP CBG: Recent Labs  Lab 09/30/17 1224 09/30/17 1642 09/30/17 2118 10/01/17 0629 10/01/17 1151  GLUCAP 163* 114* 116* 142* 151*   D-Dimer No results for input(s): DDIMER in the last 72 hours. Hgb A1c Recent Labs    09/30/17 0836  HGBA1C 7.1*   Lipid Profile Recent Labs    09/30/17 0836  CHOL 158  HDL 41  LDLCALC 83  TRIG 169*  CHOLHDL 3.9   Thyroid function studies Recent Labs    09/29/17 2055  TSH 1.502   Anemia work up No results for input(s): VITAMINB12, FOLATE, FERRITIN, TIBC, IRON, RETICCTPCT in the last 72 hours. Urinalysis    Component Value Date/Time   COLORURINE YELLOW 03/05/2007 1115   APPEARANCEUR HAZY (A) 03/05/2007 1115   LABSPEC 1.022 03/05/2007 1115   PHURINE 5.5 03/05/2007 1115   GLUCOSEU NEGATIVE 03/05/2007 1115   HGBUR NEGATIVE 03/05/2007 1115   BILIRUBINUR neg 11/21/2014 1154   KETONESUR NEGATIVE 03/05/2007 1115   PROTEINUR neg 11/21/2014 1154   PROTEINUR NEGATIVE 03/05/2007 1115   UROBILINOGEN 0.2 11/21/2014 1154   UROBILINOGEN 0.2 03/05/2007 1115   NITRITE neg 11/21/2014 1154   NITRITE NEGATIVE 03/05/2007 1115   LEUKOCYTESUR Negative 11/21/2014 1154   Sepsis Labs Invalid input(s): PROCALCITONIN,  WBC,  LACTICIDVEN Microbiology No results found for this or any previous visit (from the past 240 hour(s)).  Please note: You were cared for by a hospitalist during your hospital stay. Once you are discharged, your primary care physician will handle any further medical issues. Please note that NO REFILLS for any discharge  medications will be authorized once you are discharged, as it is imperative that you return to your primary care physician (or establish a relationship with a primary care physician if you do not have one) for your post hospital discharge needs so that they can reassess your need for medications and monitor your lab values.    Time coordinating discharge: 40 minutes  SIGNED:   Shelly Coss, MD  [facility] 10/01/2017, 1:12 PM Pager [phone]  If 7PM-7AM, please contact night-coverage [email or web address] Password [email or web address]

## 2017-09-30 NOTE — Care Management Obs Status (Signed)
[place] NOTIFICATION   Patient Details  Name: QUINTARA BOST MRN: [id number] Date of Birth: 09-24-1950   Medicare Observation Status Notification Given:  Yes    Carles Collet, RN 09/30/2017, 10:25 AM

## 2017-10-01 ENCOUNTER — Inpatient Hospital Stay (HOSPITAL_COMMUNITY): Payer: Medicare Other

## 2017-10-01 LAB — ECHOCARDIOGRAM COMPLETE
Height: 62 in
Weight: 3120 oz

## 2017-10-01 LAB — GLUCOSE, CAPILLARY
GLUCOSE-CAPILLARY: 142 mg/dL — AB (ref 70–99)
GLUCOSE-CAPILLARY: 151 mg/dL — AB (ref 70–99)

## 2017-10-01 MED ORDER — ATORVASTATIN CALCIUM 80 MG PO TABS: 80.0000 mg | ORAL_TABLET | Freq: Every day | ORAL | 0 refills | Status: DC

## 2017-10-01 MED ORDER — CLOPIDOGREL BISULFATE 75 MG PO TABS: 75.0000 mg | ORAL_TABLET | Freq: Every day | ORAL | 0 refills | Status: AC

## 2017-10-01 MED ORDER — ASPIRIN EC 81 MG PO TBEC: DELAYED_RELEASE_TABLET | ORAL | Status: DC

## 2017-10-01 NOTE — Progress Notes (Signed)
Pt took off BP cuff and O2 monitor; 0400 BP not taken.

## 2017-10-01 NOTE — Progress Notes (Signed)
  Echocardiogram 2D Echocardiogram has been performed.  Christine Navarro 10/01/2017, 9:29 AM

## 2017-10-01 NOTE — Progress Notes (Signed)
Stroke Team Progress Note     SUBJECTIVE Patient's daughter is in the room. I reviewed personally and detailed history of present illness. Patient states his speech is now recovered   back to baseline.  Echocardiogram shows normal ejection fraction without cardiac source of embolism . Patient states she wants to go home and is willing to come back for outpatient TEE and loop recorder   OBJECTIVE Most recent Vital Signs: Temp: 98 F (36.7 C) (07/21 0752) Temp Source: Oral (07/21 0752) BP: 164/86 (07/21 0752) Respiratory Rate: 14 O2 Saturdation: 96%  CBG (last 3)  Recent Labs    09/30/17 2118 10/01/17 0629 10/01/17 1151  GLUCAP 116* 142* 151*       Studies:  CT : Normal CT HEAD with without contrast for age. MRI : 1. 2.3 cm acute ischemic nonhemorrhagic right basal ganglia lacunar infarct. 2. 13 mm meningioma overlying the left frontal convexity without associated mass effect. CTA NECK: 1. No hemodynamically significant stenosis ICA. 2. Patent vertebral arteries. CTA HEAD: 1. No emergent large vessel occlusion or flow-limiting stenosis. 2. Moderate stenosis LEFT PCA most compatible with atherosclerosis.  ECHO pending LDL 83 mg% HbA1c 7.1  Physical Exam:    Pleasant middle-age Caucasian lady currently not in distress. . Afebrile. Head is nontraumatic. Neck is supple without bruit.    Cardiac exam no murmur or gallop. Lungs are clear to auscultation. Distal pulses are well felt. Neurological Exam :  Awake alert oriented x 3 normal speech and language. Mild left lower face asymmetry. Tongue midline. No drift. Mild diminished fine finger movements on left. Orbits right over left upper extremity. Mild left grip weak.. Normal sensation . Normal coordination. NIHSS 1 ASSESSMENT Christine Navarro is a 67 y.o. female with  Slurred speech and a facial droop due to large right basal ganglia infarct etiology indeterminate. Size of the infarct is too large to be called lacunar  and workup for embolic sources is necessary. Vascular risk factors of hyperlipidemia, diabetes, died obesity and sleep apnea   Hospital day # 1  TREATMENT/PLAN  I have personally examined this patient, reviewed notes, independently viewed imaging studies, participated in medical decision making and plan of care.ROS completed by me personally and pertinent positives fully documented  I have made any additions or clarifications directly to the above note. Recommend dual antiplatelet therapy aspirin and Plavix for 3 weeks followed by Plavix alone. Patient will need TEE and loop recorder   but this can be arranged as an outpatient. Have discussed with Dr. Crissie Sickles electrophysiologist on call who will arrange this. Discussed with Dr. Tawanna Solo. I spent  25 minutes in total face-to-face time with the patient, more than 50% of which was spent in counseling and coordination of care, reviewing test results, reviewing medication and discussing or reviewing the diagnosis of cryptogenic stroke    , the prognosis and treatment options.Patient interested in participation in the [place] trial follow-up as an outpatient in the stroke clinic in 6 weeks. Stroke team will sign off.   Antony Contras, MD Medical Director [facility] Pager: [phone] 10/01/2017 12:39 PM

## 2017-10-01 NOTE — Progress Notes (Addendum)
Patient and family given discharge instruction and teaching. Verbalized understanding and able to teach back. No new concerns. IV and tele removed. Signed d/c summary in chart.  Pt d/c from unit per staff in wheelchair. Family provide transport home.

## 2017-10-09 NOTE — H&P (View-Only) (Signed)
Electrophysiology Office Note Date: 10/11/2017  ID:  Christine Navarro, DOB 1950-09-04, MRN [id number]  PCP: Mayra Neer, MD Electrophysiologist: Rayann Heman  CC: to discuss ILR/TEE  Christine Navarro is a 67 y.o. female referred by Dr Leonie Man for consideration of TEE/ILR in the setting of recent cyrptogenic stroke. She presented to the hospital 09/29/17 with slurred speech and facial droop.  Imaging demonstrated large right basal ganglia infarct felt to be embolic from unknown source. She underwent stroke work up including echocardiogram and carotid imaging.  Telemetry during admission demonstrated SR with no arrhythmias. All prior EKG's in Epic reviewed and demonstrate SR.  Since discharge, the patient reports doing very well.  She denies chest pain, palpitations, dyspnea, PND, orthopnea, nausea, vomiting, dizziness, syncope, edema, weight gain, or early satiety.  Echocardiogram 09/2017 demonstrated EF 55-65%, mild LVH, no RWMA.   Past Medical History:  Diagnosis Date  . Anxiety   . Arthritis   . Asthma   . Basal cell adenocarcinoma    nose  . Diabetes mellitus without complication ([place])   . History of kidney stones   . Hypertension   . Sleep apnea    mild per pt-  no CPAP  . Stroke (cerebrum) [facility])    Past Surgical History:  Procedure Laterality Date  . ABDOMINAL HYSTERECTOMY    . APPENDECTOMY    . BREAST BIOPSY    . BREAST EXCISIONAL BIOPSY    . BREAST SURGERY     x 3  . CHOLECYSTECTOMY    . CYSTOSCOPY WITH RETROGRADE PYELOGRAM, URETEROSCOPY AND STENT PLACEMENT Right 12/23/2015   Procedure: CYSTOSCOPY WITH RETROGRADE PYELOGRAM, URETEROSCOPY AND STENT PLACEMENT;  Surgeon: Alexis Frock, MD;  Location: [facility];  Service: Urology;  Laterality: Right;  . DIAGNOSTIC LAPAROSCOPY    . HOLMIUM LASER APPLICATION Right 50/35/4656   Procedure: HOLMIUM LASER APPLICATION;  Surgeon: Alexis Frock, MD;  Location: [facility];  Service: Urology;  Laterality: Right;  . salpingectomy with  oophorectomy      Current Outpatient Medications  Medication Sig Dispense Refill  . aspirin EC 81 MG tablet Stop after 3 weeks. Then continue only on plavix    . atorvastatin (LIPITOR) 80 MG tablet Take 1 tablet (80 mg total) by mouth daily at 6 PM. 30 tablet 0  . clopidogrel (PLAVIX) 75 MG tablet Take 1 tablet (75 mg total) by mouth daily. 30 tablet 0  . LORazepam (ATIVAN) 1 MG tablet Take 0.5 mg by mouth 2 (two) times daily as needed for anxiety or sleep.     . metFORMIN (GLUCOPHAGE) 500 MG tablet Take 500 mg by mouth 2 (two) times daily.    . metoprolol tartrate (LOPRESSOR) 25 MG tablet Take 25 mg by mouth 2 (two) times daily.    Marland Kitchen oxybutynin (DITROPAN-XL) 5 MG 24 hr tablet Take 5 mg by mouth daily.    Marland Kitchen oxyCODONE-acetaminophen (PERCOCET/ROXICET) 5-325 MG tablet Take 1-2 tablets by mouth every 4 (four) hours as needed for severe pain. 30 tablet 0  . senna-docusate (SENOKOT-S) 8.6-50 MG tablet Take 1 tablet by mouth 2 (two) times daily. While taking pain meds to prevent constipation. 30 tablet 0   No current facility-administered medications for this visit.     Allergies:   Ace inhibitors; Ciprofloxacin; Sular [nisoldipine er]; Vitamin d analogs; Welchol [colesevelam hcl]; Erythromycin; Sulfa antibiotics; and Tape   Social History: Social History   Socioeconomic History  . Marital status: Divorced    Spouse name: Not on file  . Number  of children: Not on file  . Years of education: Not on file  . Highest education level: Not on file  Occupational History  . Not on file  Social Needs  . Financial resource strain: Not on file  . Food insecurity:    Worry: Not on file    Inability: Not on file  . Transportation needs:    Medical: Not on file    Non-medical: Not on file  Tobacco Use  . Smoking status: Never Smoker  . Smokeless tobacco: Never Used  Substance and Sexual Activity  . Alcohol use: No  . Drug use: No  . Sexual activity: Not on file  Lifestyle  . Physical  activity:    Days per week: Not on file    Minutes per session: Not on file  . Stress: Not on file  Relationships  . Social connections:    Talks on phone: Not on file    Gets together: Not on file    Attends religious service: Not on file    Active member of club or organization: Not on file    Attends meetings of clubs or organizations: Not on file    Relationship status: Not on file  . Intimate partner violence:    Fear of current or ex partner: Not on file    Emotionally abused: Not on file    Physically abused: Not on file    Forced sexual activity: Not on file  Other Topics Concern  . Not on file  Social History Narrative  . Not on file    Family History: Family History  Problem Relation Age of Onset  . Cancer Mother   . Breast cancer Mother   . Heart disease Father     Review of Systems: All other systems reviewed and are otherwise negative except as noted above.   Physical Exam: VS:  BP 130/86   Pulse 69   Ht 5\' 2"  (1.575 m)   Wt 192 lb 12.8 oz (87.5 kg)   SpO2 98%   BMI 35.26 kg/m  , BMI Body mass index is 35.26 kg/m. Wt Readings from Last 3 Encounters:  10/11/17 192 lb 12.8 oz (87.5 kg)  09/29/17 195 lb (88.5 kg)  12/23/15 196 lb (88.9 kg)    GEN- The patient is well appearing, alert and oriented x 3 today.   HEENT: normocephalic, atraumatic; sclera clear, conjunctiva pink; hearing intact; oropharynx clear; neck supple  Lungs- Clear to ausculation bilaterally, normal work of breathing.  No wheezes, rales, rhonchi Heart- Regular rate and rhythm  GI- soft, non-tender, non-distended, bowel sounds present  Extremities- no clubbing, cyanosis, or edema  MS- no significant deformity or atrophy Skin- warm and dry, no rash or lesion  Psych- euthymic mood, full affect Neuro- strength and sensation are intact   EKG:  EKG is not ordered today. EKG from recent admission demonstrates ST, rate 109  Recent Labs: 09/29/2017: ALT 22; TSH 1.502 09/30/2017: BUN  10; Creatinine, Ser 0.77; Hemoglobin 13.8; Platelets 255; Potassium 4.4; Sodium 140    Other studies Reviewed: Additional studies/ records that were reviewed today include: hospital records   Assessment and Plan:  1.  Cryptogenic stroke The patient had a cryptogenic stroke 09/29/17. She has been referred by Dr Leonie Man for consideration of TEE/ILR.  Risks, benefits to TEE/ILR reviewed with patient who wishes to proceed. Will schedule at next available date.  She gets anxious with any kind of procedures, will schedule with general anesthesia.   2.  HTN Stable No change required today   Current medicines are reviewed at length with the patient today.   The patient does not have concerns regarding her medicines.  The following changes were made today:  none  Labs/ tests ordered today include: none No orders of the defined types were placed in this encounter.    Disposition:   Follow up with wound check 10 days after ILR implant     Army Fossa MD 10/11/2017 9:21 AM   [facility] [address] [place] [address] [phone] (office) [phone] (fax)

## 2017-10-09 NOTE — Progress Notes (Signed)
Electrophysiology Office Note Date: 10/11/2017  ID:  Christine Navarro, DOB 1950/11/05, MRN [id number]  PCP: Mayra Neer, MD Electrophysiologist: Rayann Heman  CC: to discuss ILR/TEE  Christine Navarro is a 67 y.o. female referred by Dr Leonie Man for consideration of TEE/ILR in the setting of recent cyrptogenic stroke. She presented to the hospital 09/29/17 with slurred speech and facial droop.  Imaging demonstrated large right basal ganglia infarct felt to be embolic from unknown source. She underwent stroke work up including echocardiogram and carotid imaging.  Telemetry during admission demonstrated SR with no arrhythmias. All prior EKG's in Epic reviewed and demonstrate SR.  Since discharge, the patient reports doing very well.  She denies chest pain, palpitations, dyspnea, PND, orthopnea, nausea, vomiting, dizziness, syncope, edema, weight gain, or early satiety.  Echocardiogram 09/2017 demonstrated EF 55-65%, mild LVH, no RWMA.   Past Medical History:  Diagnosis Date  . Anxiety   . Arthritis   . Asthma   . Basal cell adenocarcinoma    nose  . Diabetes mellitus without complication ([place])   . History of kidney stones   . Hypertension   . Sleep apnea    mild per pt-  no CPAP  . Stroke (cerebrum) [facility])    Past Surgical History:  Procedure Laterality Date  . ABDOMINAL HYSTERECTOMY    . APPENDECTOMY    . BREAST BIOPSY    . BREAST EXCISIONAL BIOPSY    . BREAST SURGERY     x 3  . CHOLECYSTECTOMY    . CYSTOSCOPY WITH RETROGRADE PYELOGRAM, URETEROSCOPY AND STENT PLACEMENT Right 12/23/2015   Procedure: CYSTOSCOPY WITH RETROGRADE PYELOGRAM, URETEROSCOPY AND STENT PLACEMENT;  Surgeon: Alexis Frock, MD;  Location[facility];  Service: Urology;  Laterality: Right;  . DIAGNOSTIC LAPAROSCOPY    . HOLMIUM LASER APPLICATION Right 96/22/2979   Procedure: HOLMIUM LASER APPLICATION;  Surgeon: Alexis Frock, MD;  [facility];  Service: Urology;  Laterality: Right;  . salpingectomy with  oophorectomy      Current Outpatient Medications  Medication Sig Dispense Refill  . aspirin EC 81 MG tablet Stop after 3 weeks. Then continue only on plavix    . atorvastatin (LIPITOR) 80 MG tablet Take 1 tablet (80 mg total) by mouth daily at 6 PM. 30 tablet 0  . clopidogrel (PLAVIX) 75 MG tablet Take 1 tablet (75 mg total) by mouth daily. 30 tablet 0  . LORazepam (ATIVAN) 1 MG tablet Take 0.5 mg by mouth 2 (two) times daily as needed for anxiety or sleep.     . metFORMIN (GLUCOPHAGE) 500 MG tablet Take 500 mg by mouth 2 (two) times daily.    . metoprolol tartrate (LOPRESSOR) 25 MG tablet Take 25 mg by mouth 2 (two) times daily.    Marland Kitchen oxybutynin (DITROPAN-XL) 5 MG 24 hr tablet Take 5 mg by mouth daily.    Marland Kitchen oxyCODONE-acetaminophen (PERCOCET/ROXICET) 5-325 MG tablet Take 1-2 tablets by mouth every 4 (four) hours as needed for severe pain. 30 tablet 0  . senna-docusate (SENOKOT-S) 8.6-50 MG tablet Take 1 tablet by mouth 2 (two) times daily. While taking pain meds to prevent constipation. 30 tablet 0   No current facility-administered medications for this visit.     Allergies:   Ace inhibitors; Ciprofloxacin; Sular [nisoldipine er]; Vitamin d analogs; Welchol [colesevelam hcl]; Erythromycin; Sulfa antibiotics; and Tape   Social History: Social History   Socioeconomic History  . Marital status: Divorced    Spouse name: Not on file  . Number  of children: Not on file  . Years of education: Not on file  . Highest education level: Not on file  Occupational History  . Not on file  Social Needs  . Financial resource strain: Not on file  . Food insecurity:    Worry: Not on file    Inability: Not on file  . Transportation needs:    Medical: Not on file    Non-medical: Not on file  Tobacco Use  . Smoking status: Never Smoker  . Smokeless tobacco: Never Used  Substance and Sexual Activity  . Alcohol use: No  . Drug use: No  . Sexual activity: Not on file  Lifestyle  . Physical  activity:    Days per week: Not on file    Minutes per session: Not on file  . Stress: Not on file  Relationships  . Social connections:    Talks on phone: Not on file    Gets together: Not on file    Attends religious service: Not on file    Active member of club or organization: Not on file    Attends meetings of clubs or organizations: Not on file    Relationship status: Not on file  . Intimate partner violence:    Fear of current or ex partner: Not on file    Emotionally abused: Not on file    Physically abused: Not on file    Forced sexual activity: Not on file  Other Topics Concern  . Not on file  Social History Narrative  . Not on file    Family History: Family History  Problem Relation Age of Onset  . Cancer Mother   . Breast cancer Mother   . Heart disease Father     Review of Systems: All other systems reviewed and are otherwise negative except as noted above.   Physical Exam: VS:  BP 130/86   Pulse 69   Ht 5\' 2"  (1.575 m)   Wt 192 lb 12.8 oz (87.5 kg)   SpO2 98%   BMI 35.26 kg/m  , BMI Body mass index is 35.26 kg/m. Wt Readings from Last 3 Encounters:  10/11/17 192 lb 12.8 oz (87.5 kg)  09/29/17 195 lb (88.5 kg)  12/23/15 196 lb (88.9 kg)    GEN- The patient is well appearing, alert and oriented x 3 today.   HEENT: normocephalic, atraumatic; sclera clear, conjunctiva pink; hearing intact; oropharynx clear; neck supple  Lungs- Clear to ausculation bilaterally, normal work of breathing.  No wheezes, rales, rhonchi Heart- Regular rate and rhythm  GI- soft, non-tender, non-distended, bowel sounds present  Extremities- no clubbing, cyanosis, or edema  MS- no significant deformity or atrophy Skin- warm and dry, no rash or lesion  Psych- euthymic mood, full affect Neuro- strength and sensation are intact   EKG:  EKG is not ordered today. EKG from recent admission demonstrates ST, rate 109  Recent Labs: 09/29/2017: ALT 22; TSH 1.502 09/30/2017: BUN  10; Creatinine, Ser 0.77; Hemoglobin 13.8; Platelets 255; Potassium 4.4; Sodium 140    Other studies Reviewed: Additional studies/ records that were reviewed today include: hospital records   Assessment and Plan:  1.  Cryptogenic stroke The patient had a cryptogenic stroke 09/29/17. She has been referred by Dr Leonie Man for consideration of TEE/ILR.  Risks, benefits to TEE/ILR reviewed with patient who wishes to proceed. Will schedule at next available date.  She gets anxious with any kind of procedures, will schedule with general anesthesia.   2.  HTN Stable No change required today   Current medicines are reviewed at length with the patient today.   The patient does not have concerns regarding her medicines.  The following changes were made today:  none  Labs/ tests ordered today include: none No orders of the defined types were placed in this encounter.    Disposition:   Follow up with wound check 10 days after ILR implant     Army Fossa MD 10/11/2017 9:21 AM   [facility] [address] [place] [address] [phone] (office) [phone] (fax)

## 2017-10-11 ENCOUNTER — Encounter: Payer: Self-pay | Admitting: Nurse Practitioner

## 2017-10-11 ENCOUNTER — Ambulatory Visit (INDEPENDENT_AMBULATORY_CARE_PROVIDER_SITE_OTHER): Payer: Medicare Other | Admitting: Internal Medicine

## 2017-10-11 ENCOUNTER — Encounter: Payer: Self-pay | Admitting: *Deleted

## 2017-10-11 VITALS — BP 130/86 | HR 69 | Ht 62.0 in | Wt 192.8 lb

## 2017-10-11 DIAGNOSIS — I639 Cerebral infarction, unspecified: Secondary | ICD-10-CM | POA: Diagnosis not present

## 2017-10-11 DIAGNOSIS — I10 Essential (primary) hypertension: Secondary | ICD-10-CM

## 2017-10-11 NOTE — Patient Instructions (Addendum)
Medication Instructions:   Your physician recommends that you continue on your current medications as directed. Please refer to the Current Medication list given to you today.   If you need a refill on your cardiac medications before your next appointment, please call your pharmacy.  Labwork: NONE ORDERED  TODAY   Testing/Procedures:  ON 11-02-17 Your physician has requested that you have a TEE. During a TEE, sound waves are used to create images of your heart. It provides your doctor with information about the size and shape of your heart and how well your heart's chambers and valves are working. In this test, a transducer is attached to the end of a flexible tube that's guided down your throat and into your esophagus (the tube leading from you mouth to your stomach) to get a more detailed image of your heart. You are not awake for the procedure. Please see the instruction sheet given to you today. For further information please visit [email or web address].    Follow-Up:  10 DAYS AFTER 11-02-17  POST WOUND CHECK DEVICE CLINIC   Any Other Special Instructions Will Be Listed Below (If Applicable).

## 2017-10-16 ENCOUNTER — Ambulatory Visit: Payer: Medicare Other | Admitting: Physical Therapy

## 2017-10-16 ENCOUNTER — Ambulatory Visit: Payer: Medicare Other | Attending: Family Medicine | Admitting: Occupational Therapy

## 2017-10-16 ENCOUNTER — Encounter: Payer: Self-pay | Admitting: Physical Therapy

## 2017-10-16 ENCOUNTER — Other Ambulatory Visit: Payer: Self-pay

## 2017-10-16 ENCOUNTER — Encounter: Payer: Self-pay | Admitting: Occupational Therapy

## 2017-10-16 ENCOUNTER — Telehealth: Payer: Self-pay | Admitting: Occupational Therapy

## 2017-10-16 VITALS — BP 148/82 | HR 76

## 2017-10-16 DIAGNOSIS — R2681 Unsteadiness on feet: Secondary | ICD-10-CM | POA: Diagnosis not present

## 2017-10-16 DIAGNOSIS — R278 Other lack of coordination: Secondary | ICD-10-CM | POA: Diagnosis not present

## 2017-10-16 DIAGNOSIS — R482 Apraxia: Secondary | ICD-10-CM | POA: Diagnosis not present

## 2017-10-16 DIAGNOSIS — I639 Cerebral infarction, unspecified: Secondary | ICD-10-CM | POA: Diagnosis not present

## 2017-10-16 DIAGNOSIS — E669 Obesity, unspecified: Secondary | ICD-10-CM | POA: Diagnosis not present

## 2017-10-16 DIAGNOSIS — Z7984 Long term (current) use of oral hypoglycemic drugs: Secondary | ICD-10-CM | POA: Diagnosis not present

## 2017-10-16 DIAGNOSIS — I10 Essential (primary) hypertension: Secondary | ICD-10-CM | POA: Diagnosis not present

## 2017-10-16 DIAGNOSIS — E7800 Pure hypercholesterolemia, unspecified: Secondary | ICD-10-CM | POA: Diagnosis not present

## 2017-10-16 DIAGNOSIS — D329 Benign neoplasm of meninges, unspecified: Secondary | ICD-10-CM | POA: Diagnosis not present

## 2017-10-16 DIAGNOSIS — M6281 Muscle weakness (generalized): Secondary | ICD-10-CM | POA: Diagnosis not present

## 2017-10-16 DIAGNOSIS — R2689 Other abnormalities of gait and mobility: Secondary | ICD-10-CM | POA: Insufficient documentation

## 2017-10-16 DIAGNOSIS — R471 Dysarthria and anarthria: Secondary | ICD-10-CM | POA: Diagnosis not present

## 2017-10-16 DIAGNOSIS — I69354 Hemiplegia and hemiparesis following cerebral infarction affecting left non-dominant side: Secondary | ICD-10-CM | POA: Insufficient documentation

## 2017-10-16 DIAGNOSIS — E1169 Type 2 diabetes mellitus with other specified complication: Secondary | ICD-10-CM | POA: Diagnosis not present

## 2017-10-16 DIAGNOSIS — G4733 Obstructive sleep apnea (adult) (pediatric): Secondary | ICD-10-CM | POA: Diagnosis not present

## 2017-10-16 DIAGNOSIS — Z6834 Body mass index (BMI) 34.0-34.9, adult: Secondary | ICD-10-CM | POA: Diagnosis not present

## 2017-10-16 NOTE — Therapy (Signed)
[facility] [address] [place], [place], [address] Phone: [phone]   Fax:  [phone]  Occupational Therapy Evaluation  Patient Details  Name: Christine Navarro MRN: [id number] Date of Birth: [birth year] Referring Provider: Mayra Neer, MD - PCP.  Referred to therapy by hospitalist   Encounter Date: 10/16/2017  OT End of Session - 10/16/17 0851    Visit Number  1    Number of Visits  17    Date for OT Re-Evaluation  12/15/17    Authorization Type  Medicare, BCBS    Authorization Time Period  cert. date 10/16/17-01/14/18    Authorization - Visit Number  1    Authorization - Number of Visits  10    OT Start Time  0854    OT Stop Time  0933    OT Time Calculation (min)  39 min    Activity Tolerance  Patient tolerated treatment well    Behavior During Therapy  [facility] for tasks assessed/performed       Past Medical History:  Diagnosis Date  . Anxiety   . Arthritis   . Asthma   . Basal cell adenocarcinoma    nose  . Diabetes mellitus without complication ([place])   . History of kidney stones   . Hypertension   . Sleep apnea    mild per pt-  no CPAP  . Stroke (cerebrum) [facility])     Past Surgical History:  Procedure Laterality Date  . ABDOMINAL HYSTERECTOMY    . APPENDECTOMY    . BREAST BIOPSY    . BREAST EXCISIONAL BIOPSY    . BREAST SURGERY     x 3  . CHOLECYSTECTOMY    . CYSTOSCOPY WITH RETROGRADE PYELOGRAM, URETEROSCOPY AND STENT PLACEMENT Right 12/23/2015   Procedure: CYSTOSCOPY WITH RETROGRADE PYELOGRAM, URETEROSCOPY AND STENT PLACEMENT;  Surgeon: Alexis Frock, MD;  Location: [facility];  Service: Urology;  Laterality: Right;  . DIAGNOSTIC LAPAROSCOPY    . HOLMIUM LASER APPLICATION Right 01/77/9390   Procedure: HOLMIUM LASER APPLICATION;  Surgeon: Alexis Frock, MD;  Location: [facility];  Service: Urology;  Laterality: Right;  . salpingectomy with oophorectomy      There were no vitals filed for this  visit.  Subjective Assessment - 10/16/17 0842    Subjective   pt reports that she didn't realize that she was having a stroke    Patient is accompained by:  Family member daughter    Pertinent History  CVA 09/29/17; PMH:  anxiety, arthritis, asthma, DM, hx of kidney stones, sleep apnea, HTN, hx of basal cell adenocarcinoma    Patient Stated Goals  be able to drive, walk dog, watch grandchild    Currently in Pain?  No/denies        [facility] OT Assessment - 10/16/17 0001      Assessment   Medical Diagnosis  CVA with L sided weakness    Referring Provider  Dr. Leonie Man    Onset Date/Surgical Date  09/29/17    Hand Dominance  Right    Prior Therapy  hospitalized 7/19-21/19      Precautions   Precautions  Other (comment) no driving      Balance Screen   Has the patient fallen in the past 6 months  No      Home  Environment   Family/patient expects to be discharged to:  Private residence    Lives With  Daughter and grandchildren      Prior Function   Level  of [place]  Retired    Leisure  Psychiatrist (almost 3), cook, Medical sales representative, watch birds, walk dog (drive to house) dtr works 3rd shift      ADL   ADL comments  BADLs Mod I      IADL   Prior Level of Veterinary surgeon for transportation has went with dtr, fatigue    Prior Level of Function Light Housekeeping  independent, did most of inside cleaning and dtr did outside    [employer] light daily tasks such as dishwashing, bed making    Prior Level of Function Meal Prep  independent, did most of cooking    Meal Prep  -- performed 1 instant pot meal only since d/c    Prior Level of Function Community Mobility  drove independent    Chief Operating Officer on family or friends for transportation    Medication Management  Is responsible for taking medication in correct dosages at correct time      Mobility   Mobility Status  Independent     Mobility Status Comments  feels unsteady, off balance/dizzy, ambulates slowly with decr head turns/trunk movements dtr reports shuffling, difficulty bending      Vision - History   Baseline Vision  -- perscription sunglasses driving, reading glasses    Additional Comments  WFL per pt report, minor incr time for focusing       Activity Tolerance   Activity Tolerance Comments  fatigues quickly pe pt       Cognition   Overall Cognitive Status  Within Functional Limits for tasks assessed    Cognition Comments  pt/dtr report slurred speech/stutter and delay with word finding difficulties.  Pt also reports "having to be careful with swallowing"      Sensation   Additional Comments  Pt reports numbness on side of lower leg and foot      Coordination   Fine Motor Movements are Fluid and Coordinated  No    9 Hole Peg Test  Right;Left    Right 9 Hole Peg Test  22.4    Left 9 Hole Peg Test  24.91 2 drops      ROM / Strength   AROM / PROM / Strength  AROM      AROM   Overall AROM   Within functional limits for tasks performed      Strength   Overall Strength  Deficits    Overall Strength Comments  RUE WNL, LUE proximal strength grossly 3+ to 4-/5 shoulder and 4/5 biceps/triceps      Hand Function   Right Hand Grip (lbs)  48    Left Hand Grip (lbs)  30                      OT Education - 10/16/17 0846    Education Details  Verbally reviewed CVA symptoms; OT eval findings/POC; Speech therapy referral due to pt/dtr concerns regarding speech/swallowing    Person(s) Educated  Patient;Child(ren)    Methods  Explanation    Comprehension  Verbalized understanding       OT Short Term Goals - 10/16/17 2246      OT SHORT TERM GOAL #1   Title  Pt will be independent with initial HEP for strength and coordination.--check STGs 11/15/17    Time  4    Period  Weeks    Status  New  OT SHORT TERM GOAL #2   Title  Pt will demo improved strength to be able to retrieve 3lb  object from overhead shelf with LUE x5 with good control.    Time  4    Period  Weeks    Status  New      OT SHORT TERM GOAL #3   Title  Pt will improve L grip strength by at least 6lbs for opening containers and lifting tasks.    Baseline  30lbs    Time  4    Period  Weeks    Status  New        OT Long Term Goals - 10/16/17 2249      OT LONG TERM GOAL #1   Title  Pt will be independent with updated HEP.--check LTGs 12/15/17    Time  8    Period  Weeks    Status  New      OT LONG TERM GOAL #2   Title  Pt will demo improved strength to be able to retrieve 4-5lb object from overhead shelf with LUE x5 with good control.    Time  8    Period  Weeks    Status  New      OT LONG TERM GOAL #3   Title  Pt will improve L grip strength by at least 12lbs for opening containers and lifting tasks.    Baseline  30lbs    Time  8    Period  Weeks    Status  New      OT LONG TERM GOAL #4   Title  Pt will improve strength, balance, and activity tolerance return to previous cooking/home maintenance tasks mod I.    Time  8    Period  Weeks    Status  New            Plan - 10/16/17 2233    Clinical Impression Statement  Pt is a 67 y.o. female referred to occupational therapy s/p CVA with L sided weakness.  Pt with PMH that includes:  anxiety, arthritis, asthma, DM, hx of kidney stones, sleep apnea, HTN, hx of basal cell adenocarcinoma.  Pt presents today with decr strength, decr coordination, decr balance and functional mobility for ADLs/IADLs.  Pt would benefit from occupational therapy to address these deficits for return to prior level of functioning.      Occupational Profile and client history currently impacting functional performance  Pt was independent prior to CVA including driving and walking neighbor's dog.  Pt did most of the cooking and cleaning in the house and watched her 2 y.o. granddaughter.  Pt is currently limited in these activities.    Occupational performance  deficits (Please refer to evaluation for details):  ADL's;IADL's;Leisure;Social Participation    Rehab Potential  Good    OT Frequency  2x / week    OT Duration  8 weeks +eval, may discharge sooner depending on progress    OT Treatment/Interventions  Self-care/ADL training;Cryotherapy;Paraffin;Therapeutic exercise;DME and/or AE instruction;Functional Mobility Training;Visual/perceptual remediation/compensation;Manual Therapy;Neuromuscular education;Fluidtherapy;Ultrasound;Moist Heat;Energy conservation;Passive range of motion;Therapeutic activities;Patient/family education    Plan  initiate HEP for strength and coordination    Clinical Decision Making  Limited treatment options, no task modification necessary    Recommended Other Services  will request order for speech therapy due to pt/dtr concerns with slurring/stuttering, word finding difficulties     Consulted and Agree with Plan of Care  Patient;Family member/caregiver    Family Member Consulted  daughter       Patient will benefit from skilled therapeutic intervention in order to improve the following deficits and impairments:  Decreased balance, Decreased endurance, Decreased mobility, Decreased activity tolerance, Decreased coordination, Decreased knowledge of use of DME, Decreased strength, Impaired UE functional use  Visit Diagnosis: Hemiplegia and hemiparesis following cerebral infarction affecting left non-dominant side (HCC)  Unsteadiness on feet  Other abnormalities of gait and mobility  Other lack of coordination  Muscle weakness (generalized)    Problem List Patient Active Problem List   Diagnosis Date Noted  . Anxiety 09/30/2017  . OSA on CPAP 09/30/2017  . Obesity 09/30/2017  . Essential hypertension 09/30/2017  . Diabetes mellitus type 2 in obese ([place]) 09/30/2017  . Dysphagia 09/30/2017  . CVA (cerebral vascular accident) ([place]) 09/30/2017  . Left-sided weakness 09/29/2017  . Abdominal pain, left lower  quadrant 05/01/2012    [facility] 10/16/2017, 10:51 PM  [facility] [address] [place], [place], [address] Phone: [phone]   Fax:  [phone]  Name: Christine Navarro MRN: [id number] Date of Birth: 1950/10/17   [name], OTR/L [facility] [address] [place], [place]  [address] [phone] phone [phone] 10/16/17 10:52 PM

## 2017-10-16 NOTE — Telephone Encounter (Signed)
Dr. Leonie Man, Ms.  Christine Navarro was seen today for her occupational therapy evaluation.  She and her daughter expressed concerns with slurred/stuttering speech and word finding difficulties that are more apparent now that she is home and doing more.  They would like to be evaluated by speech therapy.  If you agree, would you please send referral for speech therapy via Epic?  Thank you,  [name], OTR/L [facility] [address] [place], [place]  [address] [phone] phone [phone] 10/16/17 10:59 PM

## 2017-10-16 NOTE — Therapy (Signed)
[facility] [address] [place], [place], [address] Phone: [phone]   Fax:  [phone]  Physical Therapy Evaluation  Patient Details  Name: Christine Navarro MRN: [id number] Date of Birth: [birth year] Referring Provider: Mayra Neer, MD - PCP.  Referred to therapy by hospitalist   Encounter Date: 10/16/2017  PT End of Session - 10/16/17 2113    Visit Number  1    Number of Visits  13    Date for PT Re-Evaluation  17/79/39 cert written for 67 years (12/15/17)    Authorization Type  Medicare and BCBS - 10th visit note routed to PCP    PT Start Time  0935    PT Stop Time  1018    PT Time Calculation (min)  43 min    Activity Tolerance  Patient tolerated treatment well    Behavior During Therapy  Flat affect       Past Medical History:  Diagnosis Date  . Anxiety   . Arthritis   . Asthma   . Basal cell adenocarcinoma    nose  . Diabetes mellitus without complication ([place])   . History of kidney stones   . Hypertension   . Sleep apnea    mild per pt-  no CPAP  . Stroke (cerebrum) [facility])     Past Surgical History:  Procedure Laterality Date  . ABDOMINAL HYSTERECTOMY    . APPENDECTOMY    . BREAST BIOPSY    . BREAST EXCISIONAL BIOPSY    . BREAST SURGERY     x 3  . CHOLECYSTECTOMY    . CYSTOSCOPY WITH RETROGRADE PYELOGRAM, URETEROSCOPY AND STENT PLACEMENT Right 12/23/2015   Procedure: CYSTOSCOPY WITH RETROGRADE PYELOGRAM, URETEROSCOPY AND STENT PLACEMENT;  Surgeon: Alexis Frock, MD;  Location: [facility];  Service: Urology;  Laterality: Right;  . DIAGNOSTIC LAPAROSCOPY    . HOLMIUM LASER APPLICATION Right 03/00/9233   Procedure: HOLMIUM LASER APPLICATION;  Surgeon: Alexis Frock, MD;  Location: [facility];  Service: Urology;  Laterality: Right;  . salpingectomy with oophorectomy      Vitals:   10/16/17 0944  BP: (!) 148/82  Pulse: 76     Subjective Assessment - 10/16/17 0944    Subjective  Pt reports  ongoing L sided weakness following CVA and 3 day stay at hospital on July 19th.  No therapy since being home.  Daughter reports balance has been off since D/C home.    Patient is accompained by:  Family member    Pertinent History  anxiety, OSA on CPAP, essential HTN, DM and obesity    Limitations  Walking    Patient Stated Goals  To improve strength and balance    Currently in Pain?  No/denies         [facility] PT Assessment - 10/16/17 0945      Assessment   Medical Diagnosis  R basal ganglia lacunar infarct    Referring Provider  Mayra Neer, MD - PCP.  Referred to therapy by hospitalist    Onset Date/Surgical Date  09/29/17    Hand Dominance  Right      Precautions   Precautions  Other (comment)    Precaution Comments  anxiety, OSA on CPAP, essential HTN, DM and obesity      Balance Screen   Has the patient fallen in the past 6 months  No      [place] residence    Living Arrangements  Children  Type of Home  House    Home Access  Level entry    Home Layout  Two level;Able to live on main level with bedroom/bathroom    [place] - quad;Other (comment) Also has HurryCane - keeps it in the bathroom    Additional Comments  Has a hard time getting off the toilet - has to push on cane to stand      Prior Function   Level of [place]  watch granddaughter (almost 3), cook, laundry, watch birds, walk dog (drive to house), cleaning      Observation/Other Assessments   Focus on Therapeutic Outcomes (FOTO)   Not performed on eval      Sensation   Light Touch  Impaired by gross assessment    Additional Comments  Pt reports numbness on side of lower leg and foot      ROM / Strength   AROM / PROM / Strength  Strength      Strength   Overall Strength  Deficits    Overall Strength Comments  RLE: WNL.  LLE: not a smooth, consistent contraction-jerky 4+/5      Ambulation/Gait   Ambulation/Gait  Yes     Ambulation/Gait Assistance  5: Supervision    Ambulation/Gait Assistance Details  Pt touches walls and furniture on R side    Ambulation Distance (Feet)  115 Feet    Assistive device  None    Gait Pattern  Step-through pattern;Decreased stance time - left;Decreased stride length;Decreased weight shift to left    Ambulation Surface  Level;Indoor      Standardized Balance Assessment   Standardized Balance Assessment  Five Times Sit to Stand;10 meter walk test;Berg Balance Test    Five times sit to stand comments   20.25 wide BOS, instability in L knee    10 Meter Walk  13.63 seconds or 2.4 ft/sec      Berg Balance Test   Sit to Stand  Able to stand without using hands and stabilize independently    Standing Unsupported  Able to stand 2 minutes with supervision    Sitting with Back Unsupported but Feet Supported on Floor or Stool  Able to sit safely and securely 2 minutes    Stand to Sit  Sits safely with minimal use of hands    Transfers  Able to transfer safely, minor use of hands    Standing Unsupported with Eyes Closed  Able to stand 10 seconds with supervision    Standing Ubsupported with Feet Together  Able to place feet together independently and stand for 1 minute with supervision    From Standing, Reach Forward with Outstretched Arm  Can reach forward >12 cm safely (5")    From Standing Position, Pick up Object from Floor  Able to pick up shoe, needs supervision    From Standing Position, Turn to Look Behind Over each Shoulder  Looks behind from both sides and weight shifts well    Turn 360 Degrees  Needs close supervision or verbal cueing    Standing Unsupported, Alternately Place Feet on Step/Stool  Able to complete 4 steps without aid or supervision    Standing Unsupported, One Foot in Front  Able to plae foot ahead of the other independently and hold 30 seconds    Standing on One Leg  Able to lift leg independently and hold 5-10 seconds right leg    Total Score  44    Berg  comment:  44/56 significant falls risk       Patient demonstrates increased fall risk as noted by score of 44/56 on Berg Balance Scale.  (<36= high risk for falls, close to 100%; 37-45 significant >80%; 46-51 moderate >50%; 52-55 lower >25%)           Objective measurements completed on examination: See above findings.              PT Education - 10/16/17 2112    Education Details  PT clinical findings, PT POC and goals, falls risk and recommendation to start using HurryCane to ambulate through the house and in community    Person(s) Educated  Patient;Child(ren)    Methods  Explanation    Comprehension  Verbalized understanding       PT Short Term Goals - 10/16/17 2122      PT SHORT TERM GOAL #1   Title  Pt will participate in assessment of DGI     Time  3    Period  Weeks    Status  New    Target Date  11/07/17      PT SHORT TERM GOAL #2   Title  Pt will initiate gait training with cane indoors and outdoors to decrease falls risk    Baseline  pt wall and furniture walks    Time  3    Period  Weeks    Status  New    Target Date  11/07/17      PT SHORT TERM GOAL #3   Title  Pt will improve LE strength as indicated by decrease in five time sit to stand to </= 16 seconds with more normal BOS    Baseline  20.25 sec with use of UE on mat, wide BOS    Time  3    Period  Weeks    Status  New    Target Date  11/07/17      PT SHORT TERM GOAL #4   Title  Pt will improve BERG score to >/= 48/56 to decrease falls risk    Baseline  44/56    Time  3    Period  Weeks    Status  New    Target Date  11/07/17      PT SHORT TERM GOAL #5   Title  Pt will increase gait velocity to >2.62 ft/sec with use of cane    Baseline  2.4 without cane    Time  3    Period  Weeks    Status  New    Target Date  11/07/17        PT Long Term Goals - 10/16/17 2127      PT LONG TERM GOAL #1   Title  Pt will be independent with HEP and community wellness (water aerobics?)     Time  6    Period  Weeks    Status  New    Target Date  11/30/17      PT LONG TERM GOAL #2   Title  Pt will improve DGI score by 4 points    Baseline  TBD    Time  6    Period  Weeks    Status  New    Target Date  11/30/17      PT LONG TERM GOAL #3   Title  Pt will improve five time sit to stand to </= 13 seconds without UE use    Time  6  Period  Weeks    Status  New    Target Date  11/30/17      PT LONG TERM GOAL #4   Title  Pt will improve BERG to >/= 53/56 to indicated lower risk for falls    Time  6    Period  Weeks    Status  New    Target Date  11/30/17      PT LONG TERM GOAL #5   Title  Pt will improve gait velocity to >/= 3.0 ft/sec with LRAD     Time  6    Period  Weeks    Status  New    Target Date  11/30/17      Additional Long Term Goals   Additional Long Term Goals  Yes      PT LONG TERM GOAL #6   Title  Pt will ambulate 1000' over outdoor paved and grassy surfaces MOD I with LRAD to inidicate safe return to dog walking.    Time  6    Period  Weeks    Status  New    Target Date  11/30/17             Plan - 10/16/17 2115    Clinical Impression Statement  Pt is a 67 year old female referred to Neuro OPPT for evaluation of L sided weakness and imbalance following ischemic nonhemorrhagic R basal ganglia lacunar infarct; pt to have loop recorder placed.  Pt's PMH is significant for the following: anxiety, OSA on CPAP, essential HTN, DM and obesity. The following deficits were noted during pt's exam: impaired sensation and impaired motor planning and strength on L side, dizziness, impaired balance, and impaired gait.  Pt's gait speed and BERG score indicates pt is at significant risk for falls. Pt would benefit from skilled PT to address these impairments and functional limitations to maximize functional mobility independence and reduce falls risk.    History and Personal Factors relevant to plan of care:  independent prior to CVA, was driving and  walking neighbor's dogs every week day, will have loop recorder to assess origin of CVA, anxiety, OSA on CPAP, essential HTN, DM and obesity    Clinical Presentation  Evolving    Clinical Presentation due to:  independent prior to CVA, was driving and walking neighbor's dogs every week day, will have loop recorder to assess origin of CVA, anxiety, OSA on CPAP, essential HTN, DM and obesity    Clinical Decision Making  Moderate    Rehab Potential  Good    PT Frequency  2x / week    PT Duration  6 weeks cert written for 60 days    PT Treatment/Interventions  ADLs/Self Care Home Management;Aquatic Therapy;Electrical Stimulation;DME Instruction;Gait training;Functional mobility training;Therapeutic activities;Therapeutic exercise;Balance training;Neuromuscular re-education;Patient/family education;Vestibular    PT Next Visit Plan  assess DGI and reset baselines; assess vestibular.  Pt to bring in her HurryCane - begin gait training with cane.  Initiate standing balance, L strengthening HEP ([place]?)    PT Home Exercise Plan  water aerobics?  Pt did not like to exercise before-would walk dogs daily    Consulted and Agree with Plan of Care  Patient;Family member/caregiver    Family Member Consulted  daughter       Patient will benefit from skilled therapeutic intervention in order to improve the following deficits and impairments:  Decreased balance, Dizziness, Difficulty walking, Decreased strength, Impaired sensation  Visit Diagnosis: Hemiplegia and hemiparesis following cerebral infarction affecting  left non-dominant side (HCC)  Unsteadiness on feet  Other abnormalities of gait and mobility     Problem List Patient Active Problem List   Diagnosis Date Noted  . Anxiety 09/30/2017  . OSA on CPAP 09/30/2017  . Obesity 09/30/2017  . Essential hypertension 09/30/2017  . Diabetes mellitus type 2 in obese ([place]) 09/30/2017  . Dysphagia 09/30/2017  . CVA (cerebral vascular accident) ([place])  09/30/2017  . Left-sided weakness 09/29/2017  . Abdominal pain, left lower quadrant 05/01/2012    Rico Junker, PT, DPT 10/16/17    9:33 PM    [facility] [address] [place], [place], [address] Phone: [phone]   Fax:  [phone]  Name: LONDA MACKOWSKI MRN: [id number] Date of Birth: February 23, 1951

## 2017-10-17 ENCOUNTER — Ambulatory Visit: Payer: Medicare Other

## 2017-10-23 ENCOUNTER — Ambulatory Visit: Payer: Medicare Other | Admitting: Occupational Therapy

## 2017-10-23 ENCOUNTER — Encounter: Payer: Self-pay | Admitting: Occupational Therapy

## 2017-10-23 DIAGNOSIS — I69354 Hemiplegia and hemiparesis following cerebral infarction affecting left non-dominant side: Secondary | ICD-10-CM

## 2017-10-23 DIAGNOSIS — R2689 Other abnormalities of gait and mobility: Secondary | ICD-10-CM | POA: Diagnosis not present

## 2017-10-23 DIAGNOSIS — M6281 Muscle weakness (generalized): Secondary | ICD-10-CM | POA: Diagnosis not present

## 2017-10-23 DIAGNOSIS — R2681 Unsteadiness on feet: Secondary | ICD-10-CM | POA: Diagnosis not present

## 2017-10-23 DIAGNOSIS — R278 Other lack of coordination: Secondary | ICD-10-CM | POA: Diagnosis not present

## 2017-10-23 DIAGNOSIS — R482 Apraxia: Secondary | ICD-10-CM | POA: Diagnosis not present

## 2017-10-23 NOTE — Telephone Encounter (Signed)
Attempted to call patient in regards to OTs message regarding patients and daughters concern with slurred/stuttering speech and word finding difficulties that have been more apparent since discharge. Per Dr. Lenell Antu note from the hospital, speech deficits resolved prior to being discharged. It needs to be determined if these deficits reoccurred or worsened since her discharge and if so, would recommend MRI brain to rule out evolution or new stroke. If her speech concerns have been the same since discharge, a speech therapy eval will be placed.   Katrina, if patient returns call and I am unavailable, can you please assess this and notify if an MRI brain or ST eval needs to be placed based on above note. Thank you.

## 2017-10-23 NOTE — Patient Instructions (Signed)
  Coordination Activities  Perform the following activities for 20 minutes 1 times per day with left hand(s).   Rotate ball in fingertips (clockwise and counter-clockwise).  Flip cards 1 at a time as fast as you can.  Deal cards with your thumb (Hold deck in hand and push card off top with thumb).  Pick up coins and stack.  Pick up coins one at a time until you get 5-10 in your hand, then move coins from palm to fingertips to place in coin bank or container one at a time.    Grip Strengthening (Resistive Putty)   Squeeze putty using thumb and all fingers. Repeat 15 times. Do 1 sessions per day.   Extension (Assistive Putty)   Roll putty back and forth, being sure to use all fingertips. Repeat 3 times. Do 1 sessions per day.  Then pinch as below.   Palmar Pinch Strengthening (Resistive Putty)   Pinch putty between thumb and each fingertip in turn after rolling out    Finger and Thumb Extension (Resistive Putty)   With thumb and all fingers in center of putty donut, stretch out. Repeat 5-10 times. Do 1 sessions per day.

## 2017-10-23 NOTE — Therapy (Signed)
[facility] [address] [place], [place], [address] Phone: [phone]   Fax:  [phone]  Occupational Therapy Treatment  Patient Details  Name: Christine Navarro MRN: [id number] Date of Birth: 1950/06/06 Referring Provider: Mayra Neer, MD - PCP.  Referred to therapy by hospitalist   Encounter Date: 10/23/2017  OT End of Session - 10/23/17 0751    Visit Number  2    Number of Visits  17    Date for OT Re-Evaluation  12/15/17    Authorization Type  Medicare, BCBS    Authorization Time Period  cert. date 10/16/17-01/14/18    Authorization - Visit Number  2    Authorization - Number of Visits  10    OT Start Time  [phone]    OT Stop Time  0830    OT Time Calculation (min)  42 min    Activity Tolerance  Patient tolerated treatment well    Behavior During Therapy  [facility] for tasks assessed/performed       Past Medical History:  Diagnosis Date  . Anxiety   . Arthritis   . Asthma   . Basal cell adenocarcinoma    nose  . Diabetes mellitus without complication ([place])   . History of kidney stones   . Hypertension   . Sleep apnea    mild per pt-  no CPAP  . Stroke (cerebrum) [facility])     Past Surgical History:  Procedure Laterality Date  . ABDOMINAL HYSTERECTOMY    . APPENDECTOMY    . BREAST BIOPSY    . BREAST EXCISIONAL BIOPSY    . BREAST SURGERY     x 3  . CHOLECYSTECTOMY    . CYSTOSCOPY WITH RETROGRADE PYELOGRAM, URETEROSCOPY AND STENT PLACEMENT Right 12/23/2015   Procedure: CYSTOSCOPY WITH RETROGRADE PYELOGRAM, URETEROSCOPY AND STENT PLACEMENT;  Surgeon: Alexis Frock, MD;  Location: [facility];  Service: Urology;  Laterality: Right;  . DIAGNOSTIC LAPAROSCOPY    . HOLMIUM LASER APPLICATION Right 27/78/2423   Procedure: HOLMIUM LASER APPLICATION;  Surgeon: Alexis Frock, MD;  Location: [facility];  Service: Urology;  Laterality: Right;  . salpingectomy with oophorectomy      There were no vitals filed for this  visit.  Subjective Assessment - 10/23/17 0750    Subjective   I feel better    Pertinent History  CVA; anxiety, arthritis, asthma, DM, hx of kidney stones, sleep apnea, HTN, hx of basal cell adenocarcinoma    Limitations  fall risk    Currently in Pain?  No/denies        Functional reaching to place/remove clothespins with 1-8lbs resistance on vertical pole for incr hand strength and activity tolerance.  Placing grooved pegs in pegboard with min difficulty for incr coordination.   Ambulating while carrying plate with loose items in L hand with no LOB and good LUE coordination/stability.  Arm bike x 5 min level 1 for reciprocal movement/conditioning without rest, forward/backwards.  Min cueing for speed/maintain >20rpms.        OT Education - 10/23/17 0804    Education Details  Coordination HEP; Red putty HEP--see pt instructions    Person(s) Educated  Patient    Methods  Explanation;Demonstration;Verbal cues;Handout    Comprehension  Verbalized understanding;Returned demonstration;Verbal cues required       OT Short Term Goals - 10/16/17 2246      OT SHORT TERM GOAL #1   Title  Pt will be independent with initial HEP for strength and coordination.--check  STGs 11/15/17    Time  4    Period  Weeks    Status  New      OT SHORT TERM GOAL #2   Title  Pt will demo improved strength to be able to retrieve 3lb object from overhead shelf with LUE x5 with good control.    Time  4    Period  Weeks    Status  New      OT SHORT TERM GOAL #3   Title  Pt will improve L grip strength by at least 6lbs for opening containers and lifting tasks.    Baseline  30lbs    Time  4    Period  Weeks    Status  New        OT Long Term Goals - 10/16/17 2249      OT LONG TERM GOAL #1   Title  Pt will be independent with updated HEP.--check LTGs 12/15/17    Time  8    Period  Weeks    Status  New      OT LONG TERM GOAL #2   Title  Pt will demo improved strength to be able to retrieve  4-5lb object from overhead shelf with LUE x5 with good control.    Time  8    Period  Weeks    Status  New      OT LONG TERM GOAL #3   Title  Pt will improve L grip strength by at least 12lbs for opening containers and lifting tasks.    Baseline  30lbs    Time  8    Period  Weeks    Status  New      OT LONG TERM GOAL #4   Title  Pt will improve strength, balance, and activity tolerance return to previous cooking/home maintenance tasks mod I.    Time  8    Period  Weeks    Status  New            Plan - 10/23/17 0751    Clinical Impression Statement  Pt verbalized understanding of initial HEP for incr hand strength and coordination.      Occupational Profile and client history currently impacting functional performance  Pt was independent prior to CVA including driving and walking neighbor's dog.  Pt did most of the cooking and cleaning in the house and watched her 2 y.o. granddaughter.  Pt is currently limited in these activities.    Occupational performance deficits (Please refer to evaluation for details):  ADL's;IADL's;Leisure;Social Participation    Rehab Potential  Good    OT Frequency  2x / week    OT Duration  8 weeks   +eval, may discharge sooner depending on progress   OT Treatment/Interventions  Self-care/ADL training;Cryotherapy;Paraffin;Therapeutic exercise;DME and/or AE instruction;Functional Mobility Training;Visual/perceptual remediation/compensation;Manual Therapy;Neuromuscular education;Fluidtherapy;Ultrasound;Moist Heat;Energy conservation;Passive range of motion;Therapeutic activities;Patient/family education    Plan  initiate HEP for strength and coordination    Clinical Decision Making  Limited treatment options, no task modification necessary    OT Home Exercise Plan  Education provided:  coordination/red putty HEP    Recommended Other Services  will request order for speech therapy due to pt/dtr concerns with slurring/stuttering, word finding difficulties  (resent 10/23/17)    Consulted and Agree with Plan of Care  Patient;Family member/caregiver    Family Member Consulted  daughter       Patient will benefit from skilled therapeutic intervention in order to improve the following  deficits and impairments:  Decreased balance, Decreased endurance, Decreased mobility, Decreased activity tolerance, Decreased coordination, Decreased knowledge of use of DME, Decreased strength, Impaired UE functional use  Visit Diagnosis: Hemiplegia and hemiparesis following cerebral infarction affecting left non-dominant side (HCC)  Unsteadiness on feet  Other abnormalities of gait and mobility  Other lack of coordination    Problem List Patient Active Problem List   Diagnosis Date Noted  . Anxiety 09/30/2017  . OSA on CPAP 09/30/2017  . Obesity 09/30/2017  . Essential hypertension 09/30/2017  . Diabetes mellitus type 2 in obese ([place]) 09/30/2017  . Dysphagia 09/30/2017  . CVA (cerebral vascular accident) ([place]) 09/30/2017  . Left-sided weakness 09/29/2017  . Abdominal pain, left lower quadrant 05/01/2012    [facility] 10/23/2017, 8:08 AM  [place] [address] [place], [place], [address] Phone: [phone]   Fax:  [phone]  Name: Christine Navarro MRN: [id number] Date of Birth: 05/03/1950   [name], OTR/L [facility] [address] [place], [place]  [address] [phone] phone [phone] 10/23/17 8:08 AM

## 2017-10-24 ENCOUNTER — Encounter: Payer: Self-pay | Admitting: Occupational Therapy

## 2017-10-24 ENCOUNTER — Ambulatory Visit: Payer: Medicare Other | Admitting: Occupational Therapy

## 2017-10-24 DIAGNOSIS — R2681 Unsteadiness on feet: Secondary | ICD-10-CM

## 2017-10-24 DIAGNOSIS — I69354 Hemiplegia and hemiparesis following cerebral infarction affecting left non-dominant side: Secondary | ICD-10-CM

## 2017-10-24 DIAGNOSIS — R482 Apraxia: Secondary | ICD-10-CM | POA: Diagnosis not present

## 2017-10-24 DIAGNOSIS — M6281 Muscle weakness (generalized): Secondary | ICD-10-CM | POA: Diagnosis not present

## 2017-10-24 DIAGNOSIS — R2689 Other abnormalities of gait and mobility: Secondary | ICD-10-CM | POA: Diagnosis not present

## 2017-10-24 DIAGNOSIS — R278 Other lack of coordination: Secondary | ICD-10-CM | POA: Diagnosis not present

## 2017-10-24 NOTE — Patient Instructions (Signed)
Strengthening: Resisted Flexion   Attach tube to door.  Hold tubing with one arm at side. Pull forward and up with elbow straight. Move shoulder through pain-free range of motion, no further than shoulder height. Repeat 10-15 times per set.  Do 1-2 sessions per day.    Strengthening: Resisted Extension   Attach one end to door.  Hold tubing in one hand, arm forward. Pull arm back, elbow straight. Repeat 10-15 times per set. Do 1-2 sessions per day.   Resisted Horizontal Abduction: Bilateral   Sit or stand, tubing in both hands, palms down and arms out in front. Keeping arms straight, pinch shoulder blades together and stretch arms out. Repeat 10-15 times per set.  Do 1-2 sessions per day.     Triceps Extension (Frontal)    Left arm forward at chest height and bent to 90holding end of band in hand, other end secured on same side shoulder by right hand, extend DOWN arm slowly. Hold 2 seconds. Repeat 10-15 times,  Do 1-2 sessions per day.

## 2017-10-24 NOTE — Telephone Encounter (Signed)
Agree with plan 

## 2017-10-24 NOTE — Therapy (Signed)
[facility] [address] [place], [place], [address] Phone: [phone]   Fax:  [phone]  Occupational Therapy Treatment  Patient Details  Name: Christine Navarro MRN: [id number] Date of Birth: [birth year] Referring Provider: Mayra Neer, MD - PCP.  Referred to therapy by hospitalist   Encounter Date: 10/24/2017  OT End of Session - 10/24/17 0857    Visit Number  3    Number of Visits  17    Date for OT Re-Evaluation  12/15/17    Authorization Type  Medicare, BCBS    Authorization Time Period  cert. date 10/16/17-01/14/18    Authorization - Visit Number  3    Authorization - Number of Visits  10    OT Start Time  0845    OT Stop Time  0921    OT Time Calculation (min)  36 min    Activity Tolerance  Patient tolerated treatment well    Behavior During Therapy  [facility] for tasks assessed/performed       Past Medical History:  Diagnosis Date  . Anxiety   . Arthritis   . Asthma   . Basal cell adenocarcinoma    nose  . Diabetes mellitus without complication ([place])   . History of kidney stones   . Hypertension   . Sleep apnea    mild per pt-  no CPAP  . Stroke (cerebrum) [facility])     Past Surgical History:  Procedure Laterality Date  . ABDOMINAL HYSTERECTOMY    . APPENDECTOMY    . BREAST BIOPSY    . BREAST EXCISIONAL BIOPSY    . BREAST SURGERY     x 3  . CHOLECYSTECTOMY    . CYSTOSCOPY WITH RETROGRADE PYELOGRAM, URETEROSCOPY AND STENT PLACEMENT Right 12/23/2015   Procedure: CYSTOSCOPY WITH RETROGRADE PYELOGRAM, URETEROSCOPY AND STENT PLACEMENT;  Surgeon: Alexis Frock, MD;  [facility]: [facility];  Service: Urology;  Laterality: Right;  . DIAGNOSTIC LAPAROSCOPY    . HOLMIUM LASER APPLICATION Right 19/50/9326   Procedure: HOLMIUM LASER APPLICATION;  Surgeon: Alexis Frock, MD;  [facility];  Service: Urology;  Laterality: Right;  . salpingectomy with oophorectomy      There were no vitals filed for this  visit.  Subjective Assessment - 10/24/17 0853    Subjective   was a little sore this morning washing my hair    Pertinent History  CVA; anxiety, arthritis, asthma, DM, hx of kidney stones, sleep apnea, HTN, hx of basal cell adenocarcinoma    Limitations  fall risk    Currently in Pain?  No/denies        Completing [place]urdue pegboard with min difficulty for incr coordination.  In sitting, functional reaching to place small pegs in vertical pegboard for incr activity tolerance and coordination with min difficulty.  Removing pegs from red putty for incr hand/grip strength.    In standing, using BUEs with ball in shoulder flex and diagonal patterns for stretching, wt. Shift and trunk rotation.      OT Education - 10/24/17 0853    Education Details  Yellow theraband HEP--see pt instructions    Person(s) Educated  Patient    Methods  Explanation;Demonstration;Verbal cues;Handout    Comprehension  Verbalized understanding;Returned demonstration;Verbal cues required       OT Short Term Goals - 10/16/17 2246      OT SHORT TERM GOAL #1   Title  Pt will be independent with initial HEP for strength and coordination.--check STGs 11/15/17  Time  4    Period  Weeks    Status  New      OT SHORT TERM GOAL #2   Title  Pt will demo improved strength to be able to retrieve 3lb object from overhead shelf with LUE x5 with good control.    Time  4    Period  Weeks    Status  New      OT SHORT TERM GOAL #3   Title  Pt will improve L grip strength by at least 6lbs for opening containers and lifting tasks.    Baseline  30lbs    Time  4    Period  Weeks    Status  New        OT Long Term Goals - 10/24/17 0904      OT LONG TERM GOAL #1   Title  Pt will be independent with updated HEP.--check LTGs 12/15/17    Time  8    Period  Weeks    Status  New      OT LONG TERM GOAL #2   Title  Pt will demo improved strength to be able to retrieve 4-5lb object from overhead shelf with LUE x5 with  good control.    Time  8    Period  Weeks    Status  New      OT LONG TERM GOAL #3   Title  Pt will improve L grip strength by at least 12lbs for opening containers and lifting tasks.    Baseline  30lbs    Time  8    Period  Weeks    Status  New      OT LONG TERM GOAL #4   Title  Pt will improve strength, balance, and activity tolerance return to previous cooking/home maintenance tasks mod I.    Time  8    Period  Weeks    Status  New            Plan - 10/24/17 2778    Clinical Impression Statement  Pt progressing well with improving coordination and IADL performance.    Occupational Profile and client history currently impacting functional performance  Pt was independent prior to CVA including driving and walking neighbor's dog.  Pt did most of the cooking and cleaning in the house and watched her 2 y.o. granddaughter.  Pt is currently limited in these activities.    Occupational performance deficits (Please refer to evaluation for details):  ADL's;IADL's;Leisure;Social Participation    Rehab Potential  Good    OT Frequency  2x / week    OT Duration  8 weeks   +eval, may discharge sooner depending on progress   OT Treatment/Interventions  Self-care/ADL training;Cryotherapy;Paraffin;Therapeutic exercise;DME and/or AE instruction;Functional Mobility Training;Visual/perceptual remediation/compensation;Manual Therapy;Neuromuscular education;Fluidtherapy;Ultrasound;Moist Heat;Energy conservation;Passive range of motion;Therapeutic activities;Patient/family education    Plan  theraband HEP     Clinical Decision Making  Limited treatment options, no task modification necessary    OT Home Exercise Plan  Education provided:  coordination/red putty HEP    Recommended Other Services  will request order for speech therapy due to pt/dtr concerns with slurring/stuttering, word finding difficulties (resent 10/23/17)    Consulted and Agree with Plan of Care  Patient;Family member/caregiver     Family Member Consulted  daughter       Patient will benefit from skilled therapeutic intervention in order to improve the following deficits and impairments:  Decreased balance, Decreased endurance, Decreased mobility, Decreased activity tolerance,  Decreased coordination, Decreased knowledge of use of DME, Decreased strength, Impaired UE functional use  Visit Diagnosis: Hemiplegia and hemiparesis following cerebral infarction affecting left non-dominant side (HCC)  Unsteadiness on feet  Other abnormalities of gait and mobility  Other lack of coordination  Muscle weakness (generalized)    Problem List Patient Active Problem List   Diagnosis Date Noted  . Anxiety 09/30/2017  . OSA on CPAP 09/30/2017  . Obesity 09/30/2017  . Essential hypertension 09/30/2017  . Diabetes mellitus type 2 in obese ([place]) 09/30/2017  . Dysphagia 09/30/2017  . CVA (cerebral vascular accident) ([place]) 09/30/2017  . Left-sided weakness 09/29/2017  . Abdominal pain, left lower quadrant 05/01/2012    [facility] 10/24/2017, 9:05 AM  [place] [address] [place], [place], [address] Phone: [phone]   Fax:  [phone]  Name: ELLY HAFFEY MRN: [id number] Date of Birth: [birth year]   [name], OTR/L [facility] [address] [place], [place]  [address] [phone] phone [phone] 10/24/17 9:08 AM

## 2017-10-26 ENCOUNTER — Other Ambulatory Visit: Payer: Self-pay | Admitting: Neurology

## 2017-10-26 DIAGNOSIS — R4781 Slurred speech: Secondary | ICD-10-CM

## 2017-10-27 NOTE — Progress Notes (Signed)
[facility] [address] [place]. [place] [address] [phone]       OFFICE FOLLOW UP NOTE  [name] Date of Birth:  1950/08/05 Medical Record Number:  [id number]   Reason for Referral:  hospital stroke follow up  CHIEF COMPLAINT:  Chief Complaint  Patient presents with  . Follow-up    Hospital follow up room 9, stroke saw Dr. Leonie Man pt with daughter Lattie Haw and granddaughter     HPI: Christine Navarro is being seen today for initial visit in the office for cryptogenic right BG lacunar infarct on 09/29/2017. History obtained from patient, daughter and chart review. Reviewed all radiology images and labs personally.  [name] is a 67 y.o. female with PMH of HTN, HLD, DM and OSA who presented with slurred speech and a facial droop.  CT head reviewed and was negative for acute abnormality.  CTA head and neck negative for stenosis.  MRI head reviewed and showed 2.3 cm acute ischemic nonhemorrhagic right basal ganglia lacunar infarct along with 13 mm meningioma overlying the left frontal convexity without associated mass-effect.  2D echo showed an EF of 55 to 65% without cardiac source of embolus.  It was recommended to follow-up outpatient with a TEE/ILR due to cryptogenic etiology as patient requesting to be discharged home and agreed to do the studies outpatient.  LDL 83 and recommended continuation of current statin of atorvastatin 80 mg.  A1c 7.1 and recommended tight glycemic control with close PCP follow-up.  Patient does have a history of OSA and recommended continuation of compliance with CPAP at home for OSA management.  Recommended DAPT with aspirin and Plavix for 3 weeks then Plavix alone.  Patient was discharged home in stable condition.  Patient is being seen today for hospital stroke follow-up.  She continues to have some expressive aphasia but has seen an improvement.  She states that her speech had not resolved prior to leaving the hospital and it  remained about the same when she was home and since then has been seeing improvement.  She did not undergo speech therapy but does do home exercises and continues to have frequent conversations with friends and family members to help improve.  She does state that her speech worsens towards the end of the day.  She was on Lipitor after hospital discharge but due to possible statin myalgias, PCP restarted Crestor 40 mg daily which she was on previously.  Muscle aches did resolve once Lipitor was stopped.  Per daughter, patient was not taking Crestor continuously but patient states compliance with all medications at this point.  Continues to take Plavix as she completed 3 weeks of DAPT and denies side effects of bleeding or bruising.  Blood pressure today 143/83 and does monitor this at home with SBP typically less than 140.  She currently is undergoing occupational therapy and will start PT tomorrow.  She does have a history of OSA and has been compliant with CPAP.  She does complain of daily pain on left side of her head which has been new since her stroke in which she takes Tylenol for.  Denies any other symptoms associated with these headaches and denies any history of headaches/migraines.  Patient has returned back to all previous activities without complications.  Patient requesting whether she can start driving at this time.  Scheduled TEE/ILR on 11/02/2017.  Denies new or worsening stroke/TIA symptoms.   ROS:   14 system review of systems performed and negative  with exception of trouble swallowing, eye pain, insomnia, apnea, back pain, muscle cramps, dizziness, headache, numbness, speech difficulty and nervous/anxious  PMH:  Past Medical History:  Diagnosis Date  . Anxiety   . Arthritis   . Asthma   . Basal cell adenocarcinoma    nose  . Diabetes mellitus without complication ([place])   . History of kidney stones   . Hypertension   . Sleep apnea    mild per pt-  no CPAP  . Stroke (cerebrum) (HCC)      PSH:  Past Surgical History:  Procedure Laterality Date  . ABDOMINAL HYSTERECTOMY    . APPENDECTOMY    . BREAST BIOPSY    . BREAST EXCISIONAL BIOPSY    . BREAST SURGERY     x 3  . CHOLECYSTECTOMY    . CYSTOSCOPY WITH RETROGRADE PYELOGRAM, URETEROSCOPY AND STENT PLACEMENT Right 12/23/2015   Procedure: CYSTOSCOPY WITH RETROGRADE PYELOGRAM, URETEROSCOPY AND STENT PLACEMENT;  Surgeon: Alexis Frock, MD;  Location: [facility];  Service: Urology;  Laterality: Right;  . DIAGNOSTIC LAPAROSCOPY    . HOLMIUM LASER APPLICATION Right 09/07/9483   Procedure: HOLMIUM LASER APPLICATION;  Surgeon: Alexis Frock, MD;  Location: [facility];  Service: Urology;  Laterality: Right;  . salpingectomy with oophorectomy      Social History:  Social History   Socioeconomic History  . Marital status: Divorced    Spouse name: Not on file  . Number of children: Not on file  . Years of education: Not on file  . Highest education level: Not on file  Occupational History  . Not on file  Social Needs  . Financial resource strain: Not on file  . Food insecurity:    Worry: Not on file    Inability: Not on file  . Transportation needs:    Medical: Not on file    Non-medical: Not on file  Tobacco Use  . Smoking status: Never Navarro  . Smokeless tobacco: Never Used  Substance and Sexual Activity  . Alcohol use: No  . Drug use: No  . Sexual activity: Not on file  Lifestyle  . Physical activity:    Days per week: Not on file    Minutes per session: Not on file  . Stress: Not on file  Relationships  . Social connections:    Talks on phone: Not on file    Gets together: Not on file    Attends religious service: Not on file    Active member of club or organization: Not on file    Attends meetings of clubs or organizations: Not on file    Relationship status: Not on file  . Intimate partner violence:    Fear of current or ex partner: Not on file    Emotionally abused: Not on file    Physically  abused: Not on file    Forced sexual activity: Not on file  Other Topics Concern  . Not on file  Social History Narrative  . Not on file    Family History:  Family History  Problem Relation Age of Onset  . Cancer Mother   . Breast cancer Mother   . Heart disease Father   . Stroke Paternal Aunt     Medications:   Current Outpatient Medications on File Prior to Visit  Medication Sig Dispense Refill  . clopidogrel (PLAVIX) 75 MG tablet Take 1 tablet (75 mg total) by mouth daily. 30 tablet 0  . irbesartan (AVAPRO) 300 MG tablet Take 300 mg  by mouth daily.    Marland Kitchen LORazepam (ATIVAN) 1 MG tablet Take 0.5 mg by mouth 2 (two) times daily as needed for anxiety or sleep.     . metFORMIN (GLUCOPHAGE) 500 MG tablet Take 500 mg by mouth 2 (two) times daily.    . metoprolol tartrate (LOPRESSOR) 25 MG tablet Take 25 mg by mouth 2 (two) times daily.    Marland Kitchen oxybutynin (DITROPAN-XL) 5 MG 24 hr tablet Take 5 mg by mouth daily.    Marland Kitchen oxyCODONE-acetaminophen (PERCOCET/ROXICET) 5-325 MG tablet Take 1-2 tablets by mouth every 4 (four) hours as needed for severe pain. 30 tablet 0  . oxymetazoline (AFRIN) 0.05 % nasal spray Place 1 spray into both nostrils daily as needed for congestion.    . ranitidine (ZANTAC) 150 MG tablet Take 150 mg by mouth daily.    . rosuvastatin (CRESTOR) 40 MG tablet Take 40 mg by mouth daily.    Marland Kitchen senna-docusate (SENOKOT-S) 8.6-50 MG tablet Take 1 tablet by mouth 2 (two) times daily. While taking pain meds to prevent constipation. 30 tablet 0  . trolamine salicylate (ASPERCREME) 10 % cream Apply 1 application topically daily as needed for muscle pain.     No current facility-administered medications on file prior to visit.     Allergies:   Allergies  Allergen Reactions  . Ace Inhibitors Cough  . Ciprofloxacin Other (See Comments)    Muscle Cramps  . Sular [Nisoldipine Er] Other (See Comments)    HEART RACING  . Vitamin D Analogs Nausea And Vomiting  . Welchol [Colesevelam  Hcl] Nausea And Vomiting  . Erythromycin Nausea And Vomiting  . Sulfa Antibiotics Rash  . Tape Rash    Use paper tape please     Physical Exam  Vitals:   10/30/17 1500  BP: (!) 143/83  Pulse: 76  Weight: 192 lb 12.8 oz (87.5 kg)  Height: 5\' 2"  (1.575 m)   Body mass index is 35.26 kg/m. No exam data present  General: Obese pleasant middle-aged Caucasian female, seated, in no evident distress Head: head normocephalic and atraumatic.   Neck: supple with no carotid or supraclavicular bruits Cardiovascular: regular rate and rhythm, no murmurs Musculoskeletal: no deformity Skin:  no rash/petichiae Vascular:  Normal pulses all extremities  Neurologic Exam Mental Status: Awake and fully alert.  Mild expressive aphasia.  Oriented to place and time. Recent and remote memory intact. Attention span, concentration and fund of knowledge appropriate. Mood and affect appropriate.  Cranial Nerves: Fundoscopic exam reveals sharp disc margins. Pupils equal, briskly reactive to light. Extraocular movements full without nystagmus. Visual fields full to confrontation. Hearing intact. Facial sensation intact. Face, tongue, palate moves normally and symmetrically.  Motor: Normal bulk and tone. Normal strength in all tested extremity muscles. Sensory.: intact to touch , pinprick , position and vibratory sensation.  Coordination: Rapid alternating movements normal in all extremities. Finger-to-nose and heel-to-shin performed accurately bilaterally. Gait and Station: Arises from chair without difficulty. Stance is normal. Gait demonstrates normal stride length and balance . Able to heel, toe and tandem walk without difficulty.  Reflexes: 1+ and symmetric. Toes downgoing.    NIHSS  1 Modified Rankin  1   Diagnostic Data (Labs, Imaging, Testing)  CT head without contrast CTA head/neck with and without contrast 09/29/2017 IMPRESSION: CT HEAD: 1. Normal CT HEAD with without contrast for age. CTA  NECK: 1. No hemodynamically significant stenosis ICA. 2. Patent vertebral arteries. CTA HEAD: 1. No emergent large vessel occlusion or flow-limiting stenosis. 2.  Moderate stenosis LEFT PCA most compatible with atherosclerosis  MRI brain without contrast 09/30/2017 IMPRESSION: 1. 2.3 cm acute ischemic nonhemorrhagic right basal ganglia lacunar infarct. 2. 13 mm meningioma overlying the left frontal convexity without associated mass effect. 3. Otherwise normal brain MRI for age.   ASSESSMENT: Christine Navarro is a 67 y.o. year old female here with right BG lacunar infarct on 09/29/2017 secondary to cryptogenic etiology with concern for possible cardioembolic source and recommended outpatient TEE/ILR. Vascular risk factors include HTN, HLD, DM and OSA.  Patient returns today for stroke follow-up and does have residual expressive aphasia but this has been improving.    PLAN: -Continue clopidogrel 75 mg daily  and Crestor for secondary stroke prevention -F/u with PCP regarding your HLD, HTN and DM management -TEE/ILR on 11/02/2017 -Continue outpatient PT/OT -as speech has been improving, we will hold off on speech therapy at this time but patient will reach out if she would like to participate in speech therapy -Headaches are most likely residual symptom from stroke and recommended decrease use of Tylenol due to possible rebound headaches.  Recommended patient call if headaches do not subside or become debilitating and we can consider medication management at that time such as Topamax -Patient cleared to start driving as residual deficit mild expressive aphasia which continues to improve.  Recommended to ride with family member for the first few times and once they are comfortable with her driving, recommended to strive to places that she knows, avoid main roads and only drive during daytime -continue to monitor BP at home -Maintain strict control of hypertension with blood pressure goal below  130/90, diabetes with hemoglobin A1c goal below 6.5% and cholesterol with LDL cholesterol (bad cholesterol) goal below 70 mg/dL. I also advised the patient to eat a healthy diet with plenty of whole grains, cereals, fruits and vegetables, exercise regularly and maintain ideal body weight.  Follow up in 3 months or call earlier if needed   Greater than 50% of time during this 25 minute visit was spent on counseling,explanation of diagnosis of right BG lacunar infarct, reviewing risk factor management of HTN, HLD, DM and OSA, planning of further management, discussion with patient and family and coordination of care    Venancio Poisson, [facility]  [facility] [address] [place], [place] [address]  Phone [phone] Fax [phone]

## 2017-10-30 ENCOUNTER — Encounter: Payer: Self-pay | Admitting: Adult Health

## 2017-10-30 ENCOUNTER — Ambulatory Visit (INDEPENDENT_AMBULATORY_CARE_PROVIDER_SITE_OTHER): Payer: Medicare Other | Admitting: Adult Health

## 2017-10-30 VITALS — BP 143/83 | HR 76 | Ht 62.0 in | Wt 192.8 lb

## 2017-10-30 DIAGNOSIS — Z9989 Dependence on other enabling machines and devices: Secondary | ICD-10-CM | POA: Diagnosis not present

## 2017-10-30 DIAGNOSIS — I639 Cerebral infarction, unspecified: Secondary | ICD-10-CM | POA: Diagnosis not present

## 2017-10-30 DIAGNOSIS — E785 Hyperlipidemia, unspecified: Secondary | ICD-10-CM | POA: Diagnosis not present

## 2017-10-30 DIAGNOSIS — I10 Essential (primary) hypertension: Secondary | ICD-10-CM

## 2017-10-30 DIAGNOSIS — E1169 Type 2 diabetes mellitus with other specified complication: Secondary | ICD-10-CM | POA: Diagnosis not present

## 2017-10-30 DIAGNOSIS — E669 Obesity, unspecified: Secondary | ICD-10-CM

## 2017-10-30 DIAGNOSIS — G4733 Obstructive sleep apnea (adult) (pediatric): Secondary | ICD-10-CM

## 2017-10-30 NOTE — Patient Instructions (Signed)
Continue clopidogrel 75 mg daily  and crestor  for secondary stroke prevention  Continue to follow up with PCP regarding cholesterol, blood pressure and diabetes management   Continue current therapies with PT/OT  Call us if you would like to start medication for your headaches - try to limit use of tylenol due to rebound headaches  If you would like to start speech therapy, contact us and we will place orders  You are cleared to start driving at this time. Recommend that you have a family member ride with you for the first few times and after that, if they feel comfortable with you driving, drive to places you know, avoid main roads and drive only during the day time.   Continue to monitor blood pressure at home  Maintain strict control of hypertension with blood pressure goal below 130/90, diabetes with hemoglobin A1c goal below 6.5% and cholesterol with LDL cholesterol (bad cholesterol) goal below 70 mg/dL. I also advised the patient to eat a healthy diet with plenty of whole grains, cereals, fruits and vegetables, exercise regularly and maintain ideal body weight.  Followup in the future with me in 3 months or call earlier if needed       Thank you for coming to see [place] at [facility]. I hope we have been able to provide you high quality care today.  You may receive a patient satisfaction survey over the next few weeks. We would appreciate your feedback and comments so that we may continue to improve ourselves and the health of our patients.

## 2017-10-31 NOTE — Progress Notes (Signed)
I agree with the above plan 

## 2017-11-01 ENCOUNTER — Ambulatory Visit: Payer: Medicare Other | Admitting: Occupational Therapy

## 2017-11-01 ENCOUNTER — Ambulatory Visit: Payer: Medicare Other | Admitting: Physical Therapy

## 2017-11-01 ENCOUNTER — Encounter: Payer: Self-pay | Admitting: Physical Therapy

## 2017-11-01 DIAGNOSIS — I69354 Hemiplegia and hemiparesis following cerebral infarction affecting left non-dominant side: Secondary | ICD-10-CM

## 2017-11-01 DIAGNOSIS — R278 Other lack of coordination: Secondary | ICD-10-CM | POA: Diagnosis not present

## 2017-11-01 DIAGNOSIS — R2681 Unsteadiness on feet: Secondary | ICD-10-CM | POA: Diagnosis not present

## 2017-11-01 DIAGNOSIS — R482 Apraxia: Secondary | ICD-10-CM | POA: Diagnosis not present

## 2017-11-01 DIAGNOSIS — M6281 Muscle weakness (generalized): Secondary | ICD-10-CM | POA: Diagnosis not present

## 2017-11-01 DIAGNOSIS — R2689 Other abnormalities of gait and mobility: Secondary | ICD-10-CM | POA: Diagnosis not present

## 2017-11-01 NOTE — Therapy (Signed)
[facility] [address] [place], [place], [address] Phone: [phone]   Fax:  [phone]  Occupational Therapy Treatment  Patient Details  Name: Christine Navarro MRN: [id number] Date of Birth: 10-19-1950 Referring Provider: Mayra Neer, MD - PCP.  Referred to therapy by hospitalist   Encounter Date: 11/01/2017  OT End of Session - 11/01/17 0921    Visit Number  4    Number of Visits  17    Date for OT Re-Evaluation  12/15/17    Authorization Type  Medicare, BCBS    Authorization Time Period  cert. date 10/16/17-01/14/18    Authorization - Visit Number  4    Authorization - Number of Visits  10    OT Start Time  [phone]    OT Stop Time  0930    OT Time Calculation (min)  39 min    Behavior During Therapy  Flat affect;WFL for tasks assessed/performed       Past Medical History:  Diagnosis Date  . Anxiety   . Arthritis   . Asthma   . Basal cell adenocarcinoma    nose  . Diabetes mellitus without complication ([place])   . History of kidney stones   . Hypertension   . Sleep apnea    mild per pt-  no CPAP  . Stroke (cerebrum) [place])     Past Surgical History:  Procedure Laterality Date  . ABDOMINAL HYSTERECTOMY    . APPENDECTOMY    . BREAST BIOPSY    . BREAST EXCISIONAL BIOPSY    . BREAST SURGERY     x 3  . CHOLECYSTECTOMY    . CYSTOSCOPY WITH RETROGRADE PYELOGRAM, URETEROSCOPY AND STENT PLACEMENT Right 12/23/2015   Procedure: CYSTOSCOPY WITH RETROGRADE PYELOGRAM, URETEROSCOPY AND STENT PLACEMENT;  Surgeon: Alexis Frock, MD;  Location: [facility];  Service: Urology;  Laterality: Right;  . DIAGNOSTIC LAPAROSCOPY    . HOLMIUM LASER APPLICATION Right 34/74/2595   Procedure: HOLMIUM LASER APPLICATION;  Surgeon: Alexis Frock, MD;  Location: [facility];  Service: Urology;  Laterality: Right;  . salpingectomy with oophorectomy      There were no vitals filed for this visit.  Subjective Assessment - 11/01/17 0851    Patient Stated Goals  be able to drive, walk dog, watch grandchild    Currently in Pain?  No/denies          Treatment: Reviewed yellow theraband HEP, 10-15 reps each, pt returned demonstration. Shoulder flexion and diagonals with medium ball, in standing for strength and endurance. Red theraputty exercises for grip and pinch, min v.c Gripper set at level 1 to pick up 1 inch blocks, 1 rest breaks, min difficulty/ fatigue. Standing to place and remove graded clothespins from vertical antennae, min v.c for positioning. Grooved pegboard for LUE fine motor coordination, min v.c to avoid compensation.                    OT Short Term Goals - 10/16/17 2246      OT SHORT TERM GOAL #1   Title  Pt will be independent with initial HEP for strength and coordination.--check STGs 11/15/17    Time  4    Period  Weeks    Status  New      OT SHORT TERM GOAL #2   Title  Pt will demo improved strength to be able to retrieve 3lb object from overhead shelf with LUE x5 with good control.    Time  4  Period  Weeks    Status  New      OT SHORT TERM GOAL #3   Title  Pt will improve L grip strength by at least 6lbs for opening containers and lifting tasks.    Baseline  30lbs    Time  4    Period  Weeks    Status  New        OT Long Term Goals - 10/24/17 0904      OT LONG TERM GOAL #1   Title  Pt will be independent with updated HEP.--check LTGs 12/15/17    Time  8    Period  Weeks    Status  New      OT LONG TERM GOAL #2   Title  Pt will demo improved strength to be able to retrieve 4-5lb object from overhead shelf with LUE x5 with good control.    Time  8    Period  Weeks    Status  New      OT LONG TERM GOAL #3   Title  Pt will improve L grip strength by at least 12lbs for opening containers and lifting tasks.    Baseline  30lbs    Time  8    Period  Weeks    Status  New      OT LONG TERM GOAL #4   Title  Pt will improve strength, balance, and activity tolerance  return to previous cooking/home maintenance tasks mod I.    Time  8    Period  Weeks    Status  New            Plan - 11/01/17 6720    Clinical Impression Statement  Pt is progressing towards goals with improving activity tolerance and coordination.    Occupational performance deficits (Please refer to evaluation for details):  ADL's;IADL's;Leisure;Social Participation    Rehab Potential  Good    OT Frequency  2x / week    OT Duration  8 weeks    OT Treatment/Interventions  Self-care/ADL training;Cryotherapy;Paraffin;Therapeutic exercise;DME and/or AE instruction;Functional Mobility Training;Visual/perceptual remediation/compensation;Manual Therapy;Neuromuscular education;Fluidtherapy;Ultrasound;Moist Heat;Energy conservation;Passive range of motion;Therapeutic activities;Patient/family education    Plan  activity tolerance for IADLs, dynamic reaching     Consulted and Agree with Plan of Care  Patient       Patient will benefit from skilled therapeutic intervention in order to improve the following deficits and impairments:  Decreased balance, Decreased endurance, Decreased mobility, Decreased activity tolerance, Decreased coordination, Decreased knowledge of use of DME, Decreased strength, Impaired UE functional use  Visit Diagnosis: Hemiplegia and hemiparesis following cerebral infarction affecting left non-dominant side (HCC)  Other lack of coordination  Muscle weakness (generalized)    Problem List Patient Active Problem List   Diagnosis Date Noted  . Anxiety 09/30/2017  . OSA on CPAP 09/30/2017  . Obesity 09/30/2017  . Essential hypertension 09/30/2017  . Diabetes mellitus type 2 in obese ([place]) 09/30/2017  . Dysphagia 09/30/2017  . CVA (cerebral vascular accident) ([place]) 09/30/2017  . Left-sided weakness 09/29/2017  . Abdominal pain, left lower quadrant 05/01/2012    RINE,KATHRYN 11/01/2017, 9:23 AM  [place] [address] [place], [place], [address] Phone: [phone]   Fax:  [phone]  Name: Christine Navarro MRN: [id number] Date of Birth: Jul 29, 1950

## 2017-11-01 NOTE — Therapy (Signed)
[facility] [address] [place], [place], [address] Phone: [phone]   Fax:  [phone]  Physical Therapy Treatment  Patient Details  Name: Christine Navarro MRN: [id number] Date of Birth: [birth year] Referring Provider: Mayra Neer, MD - PCP.  Referred to therapy by hospitalist   Encounter Date: 11/01/2017  PT End of Session - 11/01/17 0808    Visit Number  2    Number of Visits  13    Date for PT Re-Evaluation  53/61/44   cert written for 60 days (12/15/17)   Authorization Type  Medicare and BCBS - 10th visit note routed to PCP    PT Start Time  0804    PT Stop Time  0844    PT Time Calculation (min)  40 min    Equipment Utilized During Treatment  Gait belt    Activity Tolerance  Patient tolerated treatment well;No increased pain    Behavior During Therapy  Flat affect;WFL for tasks assessed/performed       Past Medical History:  Diagnosis Date  . Anxiety   . Arthritis   . Asthma   . Basal cell adenocarcinoma    nose  . Diabetes mellitus without complication ([place])   . History of kidney stones   . Hypertension   . Sleep apnea    mild per pt-  no CPAP  . Stroke (cerebrum) [facility])     Past Surgical History:  Procedure Laterality Date  . ABDOMINAL HYSTERECTOMY    . APPENDECTOMY    . BREAST BIOPSY    . BREAST EXCISIONAL BIOPSY    . BREAST SURGERY     x 3  . CHOLECYSTECTOMY    . CYSTOSCOPY WITH RETROGRADE PYELOGRAM, URETEROSCOPY AND STENT PLACEMENT Right 12/23/2015   Procedure: CYSTOSCOPY WITH RETROGRADE PYELOGRAM, URETEROSCOPY AND STENT PLACEMENT;  Surgeon: Alexis Frock, MD;  Location: [facility];  Service: Urology;  Laterality: Right;  . DIAGNOSTIC LAPAROSCOPY    . HOLMIUM LASER APPLICATION Right 31/54/0086   Procedure: HOLMIUM LASER APPLICATION;  Surgeon: Alexis Frock, MD;  Location: [facility];  Service: Urology;  Laterality: Right;  . salpingectomy with oophorectomy      There were no vitals filed  for this visit.  Subjective Assessment - 11/01/17 0807    Subjective  No new complaints. No pain or falls to report. To clinic with hurrycane. Pt does report "just so you know, I'm nervous". Reports this happens alot.     Pertinent History  anxiety, OSA on CPAP, essential HTN, DM and obesity    Limitations  Walking    Patient Stated Goals  To improve strength and balance    Currently in Pain?  No/denies         [facility] PT Assessment - 11/01/17 0809      Standardized Balance Assessment   Standardized Balance Assessment  Dynamic Gait Index      Dynamic Gait Index   Level Surface  Normal    Change in Gait Speed  Mild Impairment    Gait with Horizontal Head Turns  Normal    Gait with Vertical Head Turns  Normal    Gait and Pivot Turn  Mild Impairment    Step Over Obstacle  Mild Impairment    Step Around Obstacles  Normal    Steps  Moderate Impairment    Total Score  19          [facility] Adult PT Treatment/Exercise - 11/01/17 0819      Ambulation/Gait  Ambulation/Gait  Yes    Ambulation/Gait Assistance  5: Supervision    Ambulation/Gait Assistance Details  pt demo's good use/sequencing with cane on level surfaces. did not go outdoors today due to weather, this will need to be addressed at next session. without AD: pt demo's a guarded gait with decreased arm swing and decreased trunk rotation.     Ambulation Distance (Feet)  230 Feet   x1 with cane; around gym/DGI no AD   Assistive device  Straight cane   hurrycane vs no AD   Gait Pattern  Step-through pattern;Decreased stance time - left;Decreased stride length;Decreased weight shift to left    Ambulation Surface  Level;Indoor      Issued these to HEP via [place] program. Min guard assist in session for balance with cues on correct ex form/technique.   Access Code: [email or web address]  URL: [email or web address]  Date: 11/01/2017  Prepared by: Willow Ora   Exercises  Sit to Stand - 10 reps - 1 sets - 1x daily - 5x  weekly  Walking Tandem Stance - 3 reps - 1 sets - 1x daily - 5x weekly  Standing Single Leg Stance with Unilateral Counter Support - 3 reps - 1 sets - 10 hold - 1x daily - 5x weekly  Wide Stance with Eyes Closed on Foam Pad - 3 reps - 1 sets - 30 hold - 1x daily - 5x weekly  Wide Stance with Eyes Closed and Head Rotation - 10 reps - 1 sets - 1x daily - 5x weekly  Wide Stance with Eyes Closed and Head Nods - 10 reps - 1 sets - 1x daily - 5x weekly        PT Education - 11/01/17 0841    Education Details  results of Dynamic Gait Index performed today; HEP for strengthening and balance; gait with HurryCane.     Person(s) Educated  Patient    Methods  Explanation;Demonstration;Verbal cues;Handout    Comprehension  Verbalized understanding;Returned demonstration;Verbal cues required;Need further instruction       PT Short Term Goals - 11/01/17 0902      PT SHORT TERM GOAL #1   Title  Pt will participate in assessment of DGI. (ALL STGs DUE 11/07/17)    Baseline  11/01/17: DGI performed this date and goals updated with baseline values.     Status  Achieved      PT SHORT TERM GOAL #2   Title  Pt will initiate gait training with cane indoors and outdoors to decrease falls risk    Baseline  pt wall and furniture walks    Time  3    Period  Weeks    Status  On-going      PT SHORT TERM GOAL #3   Title  Pt will improve LE strength as indicated by decrease in five time sit to stand to </= 16 seconds with more normal BOS    Baseline  20.25 sec with use of UE on mat, wide BOS    Time  3    Period  Weeks    Status  On-going      PT SHORT TERM GOAL #4   Title  Pt will improve BERG score to >/= 48/56 to decrease falls risk    Baseline  44/56    Time  3    Period  Weeks    Status  New      PT SHORT TERM GOAL #5   Title  Pt will increase gait velocity to >2.62  ft/sec with use of cane    Baseline  2.4 without cane    Time  3    Period  Weeks    Status  On-going        PT Long Term  Goals - 11/01/17 0903      PT LONG TERM GOAL #1   Title  Pt will be independent with HEP and community wellness (water aerobics?) (ALL LTGs DUE 11/30/17)    Time  6    Period  Weeks    Status  On-going      PT LONG TERM GOAL #2   Title  Pt will improve DGI score by 4 points    Baseline  11/01/17: 19/24 scored today for baseline    Time  6    Period  Weeks    Status  On-going      PT LONG TERM GOAL #3   Title  Pt will improve five time sit to stand to </= 13 seconds without UE use    Time  6    Period  Weeks    Status  On-going      PT LONG TERM GOAL #4   Title  Pt will improve BERG to >/= 53/56 to indicated lower risk for falls    Time  6    Period  Weeks    Status  On-going      PT LONG TERM GOAL #5   Title  Pt will improve gait velocity to >/= 3.0 ft/sec with LRAD     Time  6    Period  Weeks    Status  On-going      PT LONG TERM GOAL #6   Title  Pt will ambulate 1000' over outdoor paved and grassy surfaces MOD I with LRAD to inidicate safe return to dog walking.    Time  6    Period  Weeks    Status  On-going            Plan - 11/01/17 0808    Clinical Impression Statement  Today's skilled session focused on setting baseline values for Dynamic Gait Index with pt scoring 19/24, indicative of fall risk. Remainder of session addressed gait safety with cane and established an HEP without any issues noted or reported. Pt does need cues to relax/breath when presented with new or challenging activities. Pt is progressing toward goals and should benefit from continued PT to progress toward unmet goals.                     Rehab Potential  Good    PT Frequency  2x / week    PT Duration  6 weeks   cert written for 60 days   PT Treatment/Interventions  ADLs/Self Care Home Management;Aquatic Therapy;Electrical Stimulation;DME Instruction;Gait training;Functional mobility training;Therapeutic activities;Therapeutic exercise;Balance training;Neuromuscular  re-education;Patient/family education;Vestibular    PT Next Visit Plan  continue to work on LE strengthening being mindful of pt's knees (OA), balance on compliant surfaces- single leg and/or with vision removed. gait with Hurrycane outdoors, ramps, curbs    PT Home Exercise Plan  11/01/17: has been walking daily. Med[employer] HEP issued with Access Code: [email or web address].    Consulted and Agree with Plan of Care  Patient;Family member/caregiver    Family Member Consulted  daughter       Patient will benefit from skilled therapeutic intervention in order to improve the following deficits and impairments:  Decreased balance, Dizziness, Difficulty walking, Decreased strength, Impaired sensation  Visit Diagnosis: Hemiplegia and hemiparesis following cerebral infarction affecting left non-dominant side (HCC)  Unsteadiness on feet  Other abnormalities of gait and mobility     Problem List Patient Active Problem List   Diagnosis Date Noted  . Anxiety 09/30/2017  . OSA on CPAP 09/30/2017  . Obesity 09/30/2017  . Essential hypertension 09/30/2017  . Diabetes mellitus type 2 in obese ([place]) 09/30/2017  . Dysphagia 09/30/2017  . CVA (cerebral vascular accident) ([place]) 09/30/2017  . Left-sided weakness 09/29/2017  . Abdominal pain, left lower quadrant 05/01/2012    Willow Ora, PTA, [facility] Outpatient Neuro [facility] [address], [place], [place] [address] [phone] 11/01/17, 9:07 AM   Name: Christine Navarro MRN: [id number] Date of Birth: [birth year]

## 2017-11-01 NOTE — Patient Instructions (Signed)
Access Code: [email or web address]  URL: [email or web address]  Date: 11/01/2017  Prepared by: Willow Ora   Exercises  Sit to Stand - 10 reps - 1 sets - 1x daily - 5x weekly  Walking Tandem Stance - 3 reps - 1 sets - 1x daily - 5x weekly  Standing Single Leg Stance with Unilateral Counter Support - 3 reps - 1 sets - 10 hold - 1x daily - 5x weekly  Wide Stance with Eyes Closed on Foam Pad - 3 reps - 1 sets - 30 hold - 1x daily - 5x weekly  Wide Stance with Eyes Closed and Head Rotation - 10 reps - 1 sets - 1x daily - 5x weekly  Wide Stance with Eyes Closed and Head Nods - 10 reps - 1 sets - 1x daily - 5x weekly

## 2017-11-02 ENCOUNTER — Encounter (HOSPITAL_COMMUNITY)
Admission: RE | Disposition: A | Discharge status: Home / Self Care | Payer: Self-pay | Source: Ambulatory Visit | Attending: Internal Medicine

## 2017-11-02 ENCOUNTER — Ambulatory Visit (HOSPITAL_COMMUNITY)
Admission: RE | Admit: 2017-11-02 | Discharge: 2017-11-02 | Disposition: A | Discharge status: Home / Self Care | Payer: Medicare Other | Source: Ambulatory Visit | Attending: Internal Medicine | Admitting: Internal Medicine

## 2017-11-02 ENCOUNTER — Ambulatory Visit (HOSPITAL_BASED_OUTPATIENT_CLINIC_OR_DEPARTMENT_OTHER)
Admission: RE | Admit: 2017-11-02 | Discharge: 2017-11-02 | Disposition: A | Discharge status: Home / Self Care | Payer: Medicare Other | Source: Ambulatory Visit | Attending: Internal Medicine | Admitting: Internal Medicine

## 2017-11-02 ENCOUNTER — Ambulatory Visit (HOSPITAL_COMMUNITY): Payer: Medicare Other | Admitting: Certified Registered Nurse Anesthetist

## 2017-11-02 ENCOUNTER — Other Ambulatory Visit: Payer: Self-pay

## 2017-11-02 ENCOUNTER — Encounter (HOSPITAL_COMMUNITY): Payer: Self-pay | Admitting: Certified Registered Nurse Anesthetist

## 2017-11-02 DIAGNOSIS — Z85820 Personal history of malignant melanoma of skin: Secondary | ICD-10-CM | POA: Insufficient documentation

## 2017-11-02 DIAGNOSIS — Z90710 Acquired absence of both cervix and uterus: Secondary | ICD-10-CM | POA: Insufficient documentation

## 2017-11-02 DIAGNOSIS — Z7902 Long term (current) use of antithrombotics/antiplatelets: Secondary | ICD-10-CM | POA: Insufficient documentation

## 2017-11-02 DIAGNOSIS — I081 Rheumatic disorders of both mitral and tricuspid valves: Secondary | ICD-10-CM | POA: Diagnosis not present

## 2017-11-02 DIAGNOSIS — Z7982 Long term (current) use of aspirin: Secondary | ICD-10-CM | POA: Insufficient documentation

## 2017-11-02 DIAGNOSIS — G4733 Obstructive sleep apnea (adult) (pediatric): Secondary | ICD-10-CM | POA: Diagnosis not present

## 2017-11-02 DIAGNOSIS — Z960 Presence of urogenital implants: Secondary | ICD-10-CM | POA: Insufficient documentation

## 2017-11-02 DIAGNOSIS — J45909 Unspecified asthma, uncomplicated: Secondary | ICD-10-CM | POA: Diagnosis not present

## 2017-11-02 DIAGNOSIS — M1990 Unspecified osteoarthritis, unspecified site: Secondary | ICD-10-CM | POA: Insufficient documentation

## 2017-11-02 DIAGNOSIS — Z79899 Other long term (current) drug therapy: Secondary | ICD-10-CM | POA: Diagnosis not present

## 2017-11-02 DIAGNOSIS — Z881 Allergy status to other antibiotic agents status: Secondary | ICD-10-CM | POA: Insufficient documentation

## 2017-11-02 DIAGNOSIS — Z809 Family history of malignant neoplasm, unspecified: Secondary | ICD-10-CM | POA: Diagnosis not present

## 2017-11-02 DIAGNOSIS — Z9049 Acquired absence of other specified parts of digestive tract: Secondary | ICD-10-CM | POA: Diagnosis not present

## 2017-11-02 DIAGNOSIS — Z8249 Family history of ischemic heart disease and other diseases of the circulatory system: Secondary | ICD-10-CM | POA: Insufficient documentation

## 2017-11-02 DIAGNOSIS — Z9889 Other specified postprocedural states: Secondary | ICD-10-CM | POA: Diagnosis not present

## 2017-11-02 DIAGNOSIS — E785 Hyperlipidemia, unspecified: Secondary | ICD-10-CM | POA: Insufficient documentation

## 2017-11-02 DIAGNOSIS — Z882 Allergy status to sulfonamides status: Secondary | ICD-10-CM | POA: Diagnosis not present

## 2017-11-02 DIAGNOSIS — I639 Cerebral infarction, unspecified: Secondary | ICD-10-CM | POA: Diagnosis not present

## 2017-11-02 DIAGNOSIS — Z888 Allergy status to other drugs, medicaments and biological substances status: Secondary | ICD-10-CM | POA: Diagnosis not present

## 2017-11-02 DIAGNOSIS — Z8673 Personal history of transient ischemic attack (TIA), and cerebral infarction without residual deficits: Secondary | ICD-10-CM | POA: Diagnosis not present

## 2017-11-02 DIAGNOSIS — I10 Essential (primary) hypertension: Secondary | ICD-10-CM | POA: Diagnosis not present

## 2017-11-02 DIAGNOSIS — Z803 Family history of malignant neoplasm of breast: Secondary | ICD-10-CM | POA: Insufficient documentation

## 2017-11-02 DIAGNOSIS — Z7984 Long term (current) use of oral hypoglycemic drugs: Secondary | ICD-10-CM | POA: Diagnosis not present

## 2017-11-02 DIAGNOSIS — E119 Type 2 diabetes mellitus without complications: Secondary | ICD-10-CM | POA: Diagnosis not present

## 2017-11-02 DIAGNOSIS — G4730 Sleep apnea, unspecified: Secondary | ICD-10-CM | POA: Insufficient documentation

## 2017-11-02 DIAGNOSIS — I340 Nonrheumatic mitral (valve) insufficiency: Secondary | ICD-10-CM | POA: Diagnosis not present

## 2017-11-02 DIAGNOSIS — I6389 Other cerebral infarction: Secondary | ICD-10-CM | POA: Diagnosis not present

## 2017-11-02 DIAGNOSIS — I371 Nonrheumatic pulmonary valve insufficiency: Secondary | ICD-10-CM | POA: Diagnosis not present

## 2017-11-02 HISTORY — PX: LOOP RECORDER INSERTION: EP1214

## 2017-11-02 HISTORY — PX: TEE WITHOUT CARDIOVERSION: SHX5443

## 2017-11-02 LAB — GLUCOSE, CAPILLARY: Glucose-Capillary: 157 mg/dL — ABNORMAL HIGH (ref 70–99)

## 2017-11-02 SURGERY — LOOP RECORDER INSERTION

## 2017-11-02 SURGERY — ECHOCARDIOGRAM, TRANSESOPHAGEAL
Anesthesia: Monitor Anesthesia Care

## 2017-11-02 MED ORDER — LIDOCAINE-EPINEPHRINE 1 %-1:100000 IJ SOLN
INTRAMUSCULAR | Status: AC
  Filled 2017-11-02: qty 1

## 2017-11-02 MED ORDER — DIPHENHYDRAMINE HCL 25 MG PO CAPS
25.0000 mg | ORAL_CAPSULE | Freq: Once | ORAL | Status: AC
  Administered 2017-11-02: 25 mg via ORAL
  Filled 2017-11-02: qty 1

## 2017-11-02 MED ORDER — LIDOCAINE HCL (CARDIAC) PF 100 MG/5ML IV SOSY
PREFILLED_SYRINGE | INTRAVENOUS | Status: DC | PRN
  Administered 2017-11-02: 100 mg via INTRAVENOUS

## 2017-11-02 MED ORDER — LACTATED RINGERS IV SOLN
INTRAVENOUS | Status: AC | PRN
  Administered 2017-11-02: 1000 mL via INTRAVENOUS

## 2017-11-02 MED ORDER — LIDOCAINE-EPINEPHRINE 1 %-1:100000 IJ SOLN
INTRAMUSCULAR | Status: DC | PRN
  Administered 2017-11-02: 20 mL

## 2017-11-02 MED ORDER — SODIUM CHLORIDE 0.9 % IV SOLN: INTRAVENOUS | Status: DC

## 2017-11-02 MED ORDER — PROPOFOL 10 MG/ML IV BOLUS
INTRAVENOUS | Status: DC | PRN
  Administered 2017-11-02 (×2): 30 mg via INTRAVENOUS

## 2017-11-02 MED ORDER — DIPHENHYDRAMINE HCL 25 MG PO CAPS
ORAL_CAPSULE | ORAL | Status: AC
  Administered 2017-11-02: 25 mg via ORAL
  Filled 2017-11-02: qty 1

## 2017-11-02 MED ORDER — ONDANSETRON HCL 4 MG/2ML IJ SOLN
INTRAMUSCULAR | Status: DC | PRN
  Administered 2017-11-02: 4 mg via INTRAVENOUS

## 2017-11-02 MED ORDER — DEXAMETHASONE SODIUM PHOSPHATE 4 MG/ML IJ SOLN
INTRAMUSCULAR | Status: DC | PRN
  Administered 2017-11-02: 5 mg via INTRAVENOUS

## 2017-11-02 MED ORDER — PROPOFOL 500 MG/50ML IV EMUL
INTRAVENOUS | Status: DC | PRN
  Administered 2017-11-02: 75 ug/kg/min via INTRAVENOUS

## 2017-11-02 SURGICAL SUPPLY — 5 items
ADH SKN CLS APL DERMABOND .7 (GAUZE/BANDAGES/DRESSINGS) ×1
DERMABOND ADVANCED (GAUZE/BANDAGES/DRESSINGS) ×2
DERMABOND ADVANCED .7 DNX12 (GAUZE/BANDAGES/DRESSINGS) IMPLANT
LOOP REVEAL LINQSYS (Prosthesis & Implant Heart) ×2 IMPLANT
PACK LOOP INSERTION (CUSTOM PROCEDURE TRAY) ×3 IMPLANT

## 2017-11-02 NOTE — Progress Notes (Signed)
Endoscopy recovery completed at 1300. Patient remained in endo in phase II until loop recorder completed. Procedure care complete at 1600.

## 2017-11-02 NOTE — Progress Notes (Signed)
Patient admitted to Endoscopy for TEE. Patient informed RN that she had hives on lower arms and chest due to new soap used to wash her CPAP machine tubing. Hives noted in places that tubing touched patient. RN will make MD's aware.

## 2017-11-02 NOTE — Anesthesia Postprocedure Evaluation (Signed)
Anesthesia Post Note  Patient: Christine Navarro  Procedure(s) Performed: TRANSESOPHAGEAL ECHOCARDIOGRAM (TEE) (N/A ) LOOP RECORDER IMPLANT     Patient location during evaluation: PACU Anesthesia Type: MAC Level of consciousness: awake and alert Pain management: pain level controlled Vital Signs Assessment: post-procedure vital signs reviewed and stable Respiratory status: spontaneous breathing, nonlabored ventilation, respiratory function stable and patient connected to nasal cannula oxygen Cardiovascular status: stable and blood pressure returned to baseline Postop Assessment: no apparent nausea or vomiting Anesthetic complications: no    Last Vitals:  Vitals:   11/02/17 1250 11/02/17 1300  BP: (!) 164/60 (!) 160/58  Pulse: 63 (!) 56  Resp: 17 (!) 26  Temp:    SpO2: 95% 95%    Last Pain:  Vitals:   11/02/17 1422  TempSrc:   PainSc: 0-No pain                 Giselle Brutus S

## 2017-11-02 NOTE — CV Procedure (Signed)
  TEE  Patient anesthetized by anesthesia with Propofol IV  TEE probe advanced to mid esophagus without difficulty  LA, LAA without masses No PFO by color doppler or with injection of agitated saline  AV mildly thickene, calcified  No AI TV normal  Trace TR MV normal  Mild MR PV normal  LVEF is normal  Aorta is normal

## 2017-11-02 NOTE — Interval H&P Note (Signed)
History and Physical Interval Note:  11/02/2017 11:17 AM  Christine Navarro  has presented today for surgery, with the diagnosis of stroke  The various methods of treatment have been discussed with the patient and family. After consideration of risks, benefits and other options for treatment, the patient has consented to  Procedure(s): LOOP RECORDER INSERTION (N/A) as a surgical intervention .  The patient's history has been reviewed, patient examined, no change in status, stable for surgery.  I have reviewed the patient's chart and labs.  Questions were answered to the patient's satisfaction.     Thompson Grayer

## 2017-11-02 NOTE — Anesthesia Preprocedure Evaluation (Signed)
Anesthesia Evaluation  Patient identified by MRN, date of birth, ID band Patient awake    Reviewed: Allergy & Precautions, H&P , NPO status , Patient's Chart, lab work & pertinent test results  History of Anesthesia Complications (+) PONV and history of anesthetic complications  Airway Mallampati: II   Neck ROM: full    Dental   Pulmonary asthma , sleep apnea ,    breath sounds clear to auscultation       Cardiovascular hypertension,  Rhythm:regular Rate:Normal     Neuro/Psych PSYCHIATRIC DISORDERS Anxiety CVA    GI/Hepatic   Endo/Other  diabetes, Type 2obese  Renal/GU      Musculoskeletal  (+) Arthritis ,   Abdominal   Peds  Hematology   Anesthesia Other Findings   Reproductive/Obstetrics                             Anesthesia Physical Anesthesia Plan  ASA: III  Anesthesia Plan: MAC   Post-op Pain Management:    Induction: Intravenous  PONV Risk Score and Plan: 3 and Ondansetron, Dexamethasone, Midazolam and Treatment may vary due to age or medical condition  Airway Management Planned: Nasal Cannula  Additional Equipment:   Intra-op Plan:   Post-operative Plan:   Informed Consent: I have reviewed the patients History and Physical, chart, labs and discussed the procedure including the risks, benefits and alternatives for the proposed anesthesia with the patient or authorized representative who has indicated his/her understanding and acceptance.     Plan Discussed with: CRNA, Anesthesiologist and Surgeon  Anesthesia Plan Comments:         Anesthesia Quick Evaluation

## 2017-11-02 NOTE — Interval H&P Note (Signed)
History and Physical Interval Note:  11/02/2017 12:14 PM  Christine Navarro  has presented today for surgery, with the diagnosis of Stroke  The various methods of treatment have been discussed with the patient and family. After consideration of risks, benefits and other options for treatment, the patient has consented to  Procedure(s) with comments: TRANSESOPHAGEAL ECHOCARDIOGRAM (TEE) (N/A) - loop recorder LOOP RECORDER IMPLANT as a surgical intervention .  The patient's history has been reviewed, patient examined, no change in status, stable for surgery.  I have reviewed the patient's chart and labs.  Questions were answered to the patient's satisfaction.     Christine Navarro

## 2017-11-02 NOTE — Progress Notes (Signed)
  Echocardiogram Transesophageal Echocardiogram has been performed.  Jannett Celestine 11/02/2017, 12:46 PM

## 2017-11-02 NOTE — Transfer of Care (Signed)
Immediate Anesthesia Transfer of Care Note  Patient: Christine Navarro  Procedure(s) Performed: TRANSESOPHAGEAL ECHOCARDIOGRAM (TEE) (N/A ) LOOP RECORDER IMPLANT  Patient Location: Endoscopy Unit  Anesthesia Type:MAC  Level of Consciousness: awake, alert  and oriented  Airway & Oxygen Therapy: Patient Spontanous Breathing and Patient connected to nasal cannula oxygen  Post-op Assessment: Report given to RN, Post -op Vital signs reviewed and stable and Patient moving all extremities  Post vital signs: Reviewed and stable  Last Vitals:  Vitals Value Taken Time  BP 166/73 11/02/2017 12:41 PM  Temp 37 C 11/02/2017 12:40 PM  Pulse 60 11/02/2017 12:47 PM  Resp 17 11/02/2017 12:47 PM  SpO2 95 % 11/02/2017 12:47 PM  Vitals shown include unvalidated device data.  Last Pain:  Vitals:   11/02/17 1240  TempSrc: Oral  PainSc: 0-No pain         Complications: No apparent anesthesia complications

## 2017-11-02 NOTE — Progress Notes (Signed)
Patient requests PO benadryl for hives, advised Dr. Marcie Bal, verbal order obtained for benadryl 25mg  PO x1 dose. This was given. Will monitor for effect.

## 2017-11-02 NOTE — Discharge Instructions (Signed)

## 2017-11-03 ENCOUNTER — Encounter (HOSPITAL_COMMUNITY): Payer: Self-pay | Admitting: Internal Medicine

## 2017-11-06 ENCOUNTER — Ambulatory Visit: Payer: Medicare Other | Admitting: Occupational Therapy

## 2017-11-06 ENCOUNTER — Encounter: Payer: Self-pay | Admitting: Occupational Therapy

## 2017-11-06 ENCOUNTER — Ambulatory Visit: Payer: Medicare Other | Admitting: Physical Therapy

## 2017-11-06 VITALS — BP 194/107

## 2017-11-06 NOTE — Therapy (Signed)
[facility] [address] [place], [place], [address] Phone: [phone]   Fax:  [phone]  Patient Details  Name: MARILENE VATH MRN: [id number] Date of Birth: [birth year] Referring Provider:  Mayra Neer, MD  Encounter Date: 11/06/2017 Pt arrived today s/p loop recorder placement and TEE. She reports having an allegic reaction and receiving steroid injection at hospital. Pt' BP was elevated upon arrival LUE 176/119, RUE 194/107. Pt was transported to her dtr's car via w/c and instructed to go to her PCP office which opens at 8:30. Pt/ dtr were instructed that if pt develops CVA symptoms to go to the ED. No charge today.  RINE,KATHRYN 11/06/2017, 8:29 AM  [facility] [address] [place], [place], [address] Phone: [phone]   Fax:  [phone]

## 2017-11-07 ENCOUNTER — Telehealth: Payer: Self-pay | Admitting: Internal Medicine

## 2017-11-07 NOTE — Telephone Encounter (Signed)
New message:      Pt c/o BP issue: STAT if pt c/o blurred vision, one-sided weakness or slurred speech  1. What are your last 5 BP readings? 181/92, 191/87, 186/90, 173/89  2. Are you having any other symptoms (ex. Dizziness, headache, blurred vision, passed out)? no  3. What is your BP issue? Pt's daughter states the pt's pcp has increased her bp medication but it is not helping. Pt is currently having a nose bleed that has been going on for about a hour now.

## 2017-11-07 NOTE — Telephone Encounter (Signed)
Spoke with patient's daughter [facility]) Currently nose bleed is stopped and BP is 153/82.  Denies neuro changes.  Pt had TEE by Dr. Harrington Challenger and [facility] by Dr. Rayann Heman. I advised that patient is not technically a cardiology patient and so she should contact PCP and neuro with concerns. Advised, however, that if BP trends back up and/or nosebleed starts again that she should be taken to urgent care for evaluation.

## 2017-11-08 ENCOUNTER — Ambulatory Visit: Payer: Medicare Other | Admitting: Occupational Therapy

## 2017-11-08 ENCOUNTER — Encounter: Payer: Self-pay | Admitting: Occupational Therapy

## 2017-11-08 ENCOUNTER — Ambulatory Visit: Payer: Medicare Other | Admitting: Physical Therapy

## 2017-11-08 ENCOUNTER — Ambulatory Visit: Payer: Medicare Other

## 2017-11-08 ENCOUNTER — Encounter: Payer: Self-pay | Admitting: Physical Therapy

## 2017-11-08 VITALS — BP 138/72 | HR 75

## 2017-11-08 VITALS — BP 144/88 | HR 75

## 2017-11-08 DIAGNOSIS — R482 Apraxia: Secondary | ICD-10-CM

## 2017-11-08 DIAGNOSIS — M6281 Muscle weakness (generalized): Secondary | ICD-10-CM

## 2017-11-08 DIAGNOSIS — R2689 Other abnormalities of gait and mobility: Secondary | ICD-10-CM

## 2017-11-08 DIAGNOSIS — I69354 Hemiplegia and hemiparesis following cerebral infarction affecting left non-dominant side: Secondary | ICD-10-CM | POA: Diagnosis not present

## 2017-11-08 DIAGNOSIS — R278 Other lack of coordination: Secondary | ICD-10-CM

## 2017-11-08 DIAGNOSIS — R2681 Unsteadiness on feet: Secondary | ICD-10-CM | POA: Diagnosis not present

## 2017-11-08 DIAGNOSIS — R471 Dysarthria and anarthria: Secondary | ICD-10-CM

## 2017-11-08 NOTE — Patient Instructions (Addendum)
  Speech Exercises  Repeat 2 times, 2 times a day AND recite other tongue twisters for 10-15 minutes, twice a day  Call the cat "Buttercup" A calendar of [place], [place] Four floors to cover Yellow oil ointment Fellow lovers of felines Catastrophe in [place]' plums The church's chimes chimed Telling time 'til eleven Five valve levers Keep the gate closed Go see that guy Fat cows give milk [employer] Gophers Fat frogs flip freely Kohl's into bed Get that game to [employer] Thick thistles stick together Cinnamon aluminum linoleum Black bugs blood Lovely lemon linament Red leather, yellow leather  Big grocery buggy    Purple baby carriage Staples Proper copper coffee pot Ripe purple cabbage Three free throws [employer] tackled  [employer] dipped the dessert  [employer] [place] that [employer] of [employer] Shirts shrink, shells shouldn't [place] [employer] Take the tackle box File the flash message Give me five flapjacks Fundamental relatives Dye the pets purple Talking [place] time after time Dark chocolate chunks Political landscape of the kingdom Estate manager/land agent genius We played yo-yos yesterday   As you feel your speech beginning to improve you can back off from doing these phrases, but continue to do the tongue twisters as long as you feel is necessary.   =============================================  TONGUE exercises  --- DO ALL EXERCISES TWICE EACH DAY - until you feel like you've regained your strength on your right side  1. Tongue sweep - Sweep your tongue in the pockets of your mouth - touch every tooth   - five revolutions each way  2. PUH! - 10 times       MAKE THE SOUNDS STRONG      TUH - 10 times      KUH! - 10 times  3. Move a licorice stick, lollipop, straw from side to side in your mouth     - 10 back and forth movements  4. Push your cheek out with  your tongue - press on your tongue for 3 seconds 10 times    - keep your tongue STRONG

## 2017-11-08 NOTE — Therapy (Signed)
[facility] [address] [place], [place], [address] Phone: [phone]   Fax:  [phone]  Speech Language Pathology Evaluation  Patient Details  Name: Christine Navarro MRN: [id number] Date of Birth: 10-Apr-1950 Referring Provider: Antony Contras, MD   Encounter Date: 11/08/2017  End of Session - 11/08/17 1730    Visit Number  1    Number of Visits  1    Date for SLP Re-Evaluation  11/08/17    SLP Start Time  0933    SLP Stop Time   2025    SLP Time Calculation (min)  42 min    Activity Tolerance  Patient tolerated treatment well       Past Medical History:  Diagnosis Date  . Anxiety   . Arthritis   . Asthma   . Basal cell adenocarcinoma    nose  . Diabetes mellitus without complication ([place])   . History of kidney stones   . Hypertension   . PONV (postoperative nausea and vomiting)   . Sleep apnea    mild per pt-  no CPAP  . Stroke (cerebrum) [facility])     Past Surgical History:  Procedure Laterality Date  . ABDOMINAL HYSTERECTOMY    . APPENDECTOMY    . BREAST BIOPSY    . BREAST EXCISIONAL BIOPSY    . BREAST SURGERY     x 3  . CHOLECYSTECTOMY    . CYSTOSCOPY WITH RETROGRADE PYELOGRAM, URETEROSCOPY AND STENT PLACEMENT Right 12/23/2015   Procedure: CYSTOSCOPY WITH RETROGRADE PYELOGRAM, URETEROSCOPY AND STENT PLACEMENT;  Surgeon: Alexis Frock, MD;  Location: [facility];  Service: Urology;  Laterality: Right;  . DIAGNOSTIC LAPAROSCOPY    . HOLMIUM LASER APPLICATION Right 42/70/6237   Procedure: HOLMIUM LASER APPLICATION;  Surgeon: Alexis Frock, MD;  [facility];  Service: Urology;  Laterality: Right;  . LOOP RECORDER INSERTION N/A 11/02/2017   Procedure: LOOP RECORDER INSERTION;  Surgeon: Thompson Grayer, MD;  Location: [place] CV LAB;  Service: Cardiovascular;  Laterality: N/A;  . salpingectomy with oophorectomy    . TEE WITHOUT CARDIOVERSION N/A 11/02/2017   Procedure: TRANSESOPHAGEAL ECHOCARDIOGRAM  (TEE);  Surgeon: Fay Records, MD;  Location: [facility] ENDOSCOPY;  Service: Cardiovascular;  Laterality: N/A;  loop recorder, bubble study    There were no vitals filed for this visit.  Subjective Assessment - 11/08/17 0933    Subjective  "When I first had the stroke I was stuttering, and really had to think hard about how I should talk." "I have a friend I talked to every day adn she tells me I sound just like I did before. But if I get tired, I might back off and have to think how to talk, or I might stutter."    Currently in Pain?  No/denies         SLP Evaluation [facility] - 11/08/17 6283      SLP Visit Information   SLP Received On  11/09/17    Referring Provider  Antony Contras, MD    Onset Date  July 2019    Medical Diagnosis  CVA      Prior Functional Status   Cognitive/Linguistic Baseline  Within functional limits    Type of Home  House     Lives With  --   daughter and grand-daughter   Vocation  Retired      Cognition   Overall Cognitive Status  Within Functional Limits for tasks assessed      Auditory Comprehension  Overall Auditory Comprehension  Appears within functional limits for tasks assessed      Verbal Expression   Overall Verbal Expression  Appears within functional limits for tasks assessed      Oral Motor/Sensory Function   Overall Oral Motor/Sensory Function  Impaired    Labial ROM  Within Functional Limits    Labial Strength  Within Functional Limits    Labial Coordination  WFL    Lingual ROM  Reduced right    Lingual Symmetry  Within Functional Limits    Lingual Strength  Reduced Right    Lingual Coordination  WFL    Velum  Within Functional Limits    Overall Oral Motor/Sensory Function   Mild rt lingual weakenss and ROM. SLP provided lingual isometric and strengthening exercises to pt. She stated she would like to do these at home instead of return to [facility].       Motor Speech   Overall Motor Speech  Impaired   stated she would like to complete HEP at  home, & no ST   Articulation  --   mildly hesitant with 3-4 words; conversation WFL/WNL   Intelligibility  Intelligible    Effective Techniques  --   "concentrating more when I talk" - improved to WNL artic                     SLP Education - 11/08/17 1729    Education Details  verbal apraxia dx, what ST could offer (pt still chose to complete things at home instead), HEP for strength and for oral-motor/verbal coordination    Person(s) Educated  Patient    Methods  Explanation;Demonstration;Verbal cues;Handout    Comprehension  Verbalized understanding;Returned demonstration;Verbal cues required           Plan - 11/08/17 1731    Clinical Impression Statement  Pt presents today with mild verbal apraxia evident in repetition of 3-4 syllable words, and rare to occasional mild hesitiation and pausing in conversational speech. Additinally pt with mild rt lingual weakness. After SLP explained what ST could help pt she chose to complete a HEP at home for oral motor verbal coordination and for lingual strengthening. Pt will just be seen for ST evaluation.     Speech Therapy Frequency  One time visit    [place]  provided today    Consulted and Agree with Plan of Care  Patient       Patient will benefit from skilled therapeutic intervention in order to improve the following deficits and impairments:   Verbal apraxia  Dysarthria and anarthria    Problem List Patient Active Problem List   Diagnosis Date Noted  . Anxiety 09/30/2017  . OSA on CPAP 09/30/2017  . Obesity 09/30/2017  . Essential hypertension 09/30/2017  . Diabetes mellitus type 2 in obese ([place]) 09/30/2017  . Dysphagia 09/30/2017  . CVA (cerebral vascular accident) ([place]) 09/30/2017  . Left-sided weakness 09/29/2017  . Abdominal pain, left lower quadrant 05/01/2012    [facility] ,MS, CCC-SLP  11/08/2017, 5:33 PM  [facility] [address] [place], [place], [address] Phone: [phone]   Fax:  [phone]  Name: Christine Navarro MRN: [id number] Date of Birth: 1950-04-10

## 2017-11-08 NOTE — Therapy (Signed)
[facility] [address] [place], [place], [address] Phone: [phone]   Fax:  [phone]  Physical Therapy Treatment  Patient Details  Name: Christine Navarro MRN: [id number] Date of Birth: 12-25-1950 Referring Provider: Mayra Neer, MD - PCP.  Referred to therapy by hospitalist   Encounter Date: 11/08/2017  PT End of Session - 11/08/17 0929    Visit Number  3    Number of Visits  13    Date for PT Re-Evaluation  64/40/34   cert written for 60 days (12/15/17)   Authorization Type  Medicare and BCBS - 10th visit note routed to PCP    PT Start Time  0846    PT Stop Time  0924    PT Time Calculation (min)  38 min    Equipment Utilized During Treatment  Gait belt    Activity Tolerance  Patient tolerated treatment well;Patient limited by fatigue    Behavior During Therapy  Flat affect;WFL for tasks assessed/performed       Past Medical History:  Diagnosis Date  . Anxiety   . Arthritis   . Asthma   . Basal cell adenocarcinoma    nose  . Diabetes mellitus without complication ([place])   . History of kidney stones   . Hypertension   . PONV (postoperative nausea and vomiting)   . Sleep apnea    mild per pt-  no CPAP  . Stroke (cerebrum) [facility])     Past Surgical History:  Procedure Laterality Date  . ABDOMINAL HYSTERECTOMY    . APPENDECTOMY    . BREAST BIOPSY    . BREAST EXCISIONAL BIOPSY    . BREAST SURGERY     x 3  . CHOLECYSTECTOMY    . CYSTOSCOPY WITH RETROGRADE PYELOGRAM, URETEROSCOPY AND STENT PLACEMENT Right 12/23/2015   Procedure: CYSTOSCOPY WITH RETROGRADE PYELOGRAM, URETEROSCOPY AND STENT PLACEMENT;  Surgeon: Alexis Frock, MD;  [facility];  Service: Urology;  Laterality: Right;  . DIAGNOSTIC LAPAROSCOPY    . HOLMIUM LASER APPLICATION Right 74/25/9563   Procedure: HOLMIUM LASER APPLICATION;  Surgeon: Alexis Frock, MD;  [facility];  Service: Urology;  Laterality: Right;  . LOOP  RECORDER INSERTION N/A 11/02/2017   Procedure: LOOP RECORDER INSERTION;  Surgeon: Thompson Grayer, MD;  Location: [place] CV LAB;  Service: Cardiovascular;  Laterality: N/A;  . salpingectomy with oophorectomy    . TEE WITHOUT CARDIOVERSION N/A 11/02/2017   Procedure: TRANSESOPHAGEAL ECHOCARDIOGRAM (TEE);  Surgeon: Fay Records, MD;  Location: [facility] ENDOSCOPY;  Service: Cardiovascular;  Laterality: N/A;  loop recorder, bubble study    Vitals:   11/08/17 0853  BP: (!) 144/88  Pulse: 75    Subjective Assessment - 11/08/17 0850    Subjective  Haven't had a chance to do the exercises, with all the medical appointments and tests.  Feel like my blood pressure has gone down.    Pertinent History  anxiety, OSA on CPAP, essential HTN, DM and obesity    Limitations  Walking    Patient Stated Goals  To improve strength and balance    Currently in Pain?  No/denies                       [facility] Adult PT Treatment/Exercise - 11/08/17 0001      Transfers   Transfers  Sit to Stand;Stand to Sit    Sit to Stand  6: Modified independent (Device/Increase time);Without upper extremity assist;From bed  Stand to Sit  6: Modified independent (Device/Increase time);Without upper extremity assist;To bed      Ambulation/Gait   Ambulation/Gait  Yes    Ambulation/Gait Assistance  5: Supervision    Ambulation Distance (Feet)  200 Feet    Assistive device  Straight cane   Hurrycane   Gait Pattern  Step-through pattern;Decreased stance time - left;Decreased stride length;Decreased weight shift to left    Ambulation Surface  Level;Indoor    Gait Comments  Discussed pt using cane in home and outdoors for now, especially given pt's reports of weakness (from significant nose bleed yesterday), for improved stability and balance.          Balance Exercises - 11/08/17 0905      Balance Exercises: Standing   Standing Eyes Opened  Wide (BOA);Narrow base of support (BOS);Foam/compliant surface;5  reps;Head turns   Head nods   Standing Eyes Closed  Wide (BOA);Narrow base of support (BOS);Foam/compliant surface;Head turns;Solid surface;5 reps   Head nods   SLS  Eyes open;Solid surface;2 reps;10 secs    Tandem Gait  Forward;Other reps (comment)   6 reps-cues for posture and slowed pace   Other Standing Exercises  On pillow surface:  marching in place x 10 reps, alternating forward kicks x 10 reps, alternating forward step taps x 10 reps with intermittent UE support, for improved dynamic balance.  Reviewed HEP from [place] return demo with minimal cues          PT Short Term Goals - 11/01/17 0902      PT SHORT TERM GOAL #1   Title  Pt will participate in assessment of DGI. (ALL STGs DUE 11/07/17)    Baseline  11/01/17: DGI performed this date and goals updated with baseline values.     Status  Achieved      PT SHORT TERM GOAL #2   Title  Pt will initiate gait training with cane indoors and outdoors to decrease falls risk    Baseline  pt wall and furniture walks    Time  3    Period  Weeks    Status  On-going      PT SHORT TERM GOAL #3   Title  Pt will improve LE strength as indicated by decrease in five time sit to stand to </= 16 seconds with more normal BOS    Baseline  20.25 sec with use of UE on mat, wide BOS    Time  3    Period  Weeks    Status  On-going      PT SHORT TERM GOAL #4   Title  Pt will improve BERG score to >/= 48/56 to decrease falls risk    Baseline  44/56    Time  3    Period  Weeks    Status  New      PT SHORT TERM GOAL #5   Title  Pt will increase gait velocity to >2.62 ft/sec with use of cane    Baseline  2.4 without cane    Time  3    Period  Weeks    Status  On-going        PT Long Term Goals - 11/01/17 3710      PT LONG TERM GOAL #1   Title  Pt will be independent with HEP and community wellness (water aerobics?) (ALL LTGs DUE 11/30/17)    Time  6    Period  Weeks    Status  On-going  PT LONG TERM GOAL #2   Title  Pt  will improve DGI score by 4 points    Baseline  11/01/17: 19/24 scored today for baseline    Time  6    Period  Weeks    Status  On-going      PT LONG TERM GOAL #3   Title  Pt will improve five time sit to stand to </= 13 seconds without UE use    Time  6    Period  Weeks    Status  On-going      PT LONG TERM GOAL #4   Title  Pt will improve BERG to >/= 53/56 to indicated lower risk for falls    Time  6    Period  Weeks    Status  On-going      PT LONG TERM GOAL #5   Title  Pt will improve gait velocity to >/= 3.0 ft/sec with LRAD     Time  6    Period  Weeks    Status  On-going      PT LONG TERM GOAL #6   Title  Pt will ambulate 1000' over outdoor paved and grassy surfaces MOD I with LRAD to inidicate safe return to dog walking.    Time  6    Period  Weeks    Status  On-going            Plan - 11/08/17 0930    Clinical Impression Statement  Skilled PT session focused today on review of HEP and progression of dynamic standing balance activities.  Focus of balance activities is on compliant surface, head motions/lower extremity motions to simulate pt's reported difficulty with balance in shower.  Pt reports fatigue today since pt had significant nose bleed yesterday.  Pt will continue to beneift from skilled PT to progress towards goals.    Rehab Potential  Good    PT Frequency  2x / week    PT Duration  6 weeks   cert written for 60 days   PT Treatment/Interventions  ADLs/Self Care Home Management;Aquatic Therapy;Electrical Stimulation;DME Instruction;Gait training;Functional mobility training;Therapeutic activities;Therapeutic exercise;Balance training;Neuromuscular re-education;Patient/family education;Vestibular    PT Next Visit Plan  continue to work on LE strengthening being mindful of pt's knees (OA), balance on compliant surfaces- single leg and/or with vision removed. gait with Hurrycane outdoors, ramps, curbs    PT Home Exercise Plan  11/01/17: has been walking  daily. [employer] HEP issued with Access Code: [email or web address].    Consulted and Agree with Plan of Care  Patient;Family member/caregiver    Family Member Consulted  daughter       Patient will benefit from skilled therapeutic intervention in order to improve the following deficits and impairments:  Decreased balance, Dizziness, Difficulty walking, Decreased strength, Impaired sensation  Visit Diagnosis: Unsteadiness on feet  Other abnormalities of gait and mobility     Problem List Patient Active Problem List   Diagnosis Date Noted  . Anxiety 09/30/2017  . OSA on CPAP 09/30/2017  . Obesity 09/30/2017  . Essential hypertension 09/30/2017  . Diabetes mellitus type 2 in obese ([place]) 09/30/2017  . Dysphagia 09/30/2017  . CVA (cerebral vascular accident) ([place]) 09/30/2017  . Left-sided weakness 09/29/2017  . Abdominal pain, left lower quadrant 05/01/2012    Izrael Peak W. 11/08/2017, 9:32 AM  [facility]raz[facility] Butt., PT [facility] [address] [place], [place], [address] Phone: [phone]   Fax:  [phone]  Name: Janece  ISSABELLE MCRANEY MRN: [id number] Date of Birth: 1951-01-28

## 2017-11-08 NOTE — Therapy (Signed)
[facility] [address] [place], [place], [address] Phone: [phone]   Fax:  [phone]  Occupational Therapy Treatment  Patient Details  Name: Christine Navarro MRN: [id number] Date of Birth: Jul 15, 1950 Referring Provider: Mayra Neer, MD - PCP.  Referred to therapy by hospitalist   Encounter Date: 11/08/2017  OT End of Session - 11/08/17 0756    Visit Number  5    Number of Visits  17    Date for OT Re-Evaluation  12/15/17    Authorization Type  Medicare, BCBS    Authorization Time Period  cert. date 10/16/17-01/14/18    Authorization - Visit Number  5    Authorization - Number of Visits  10    OT Start Time  [phone]    OT Stop Time  0831    OT Time Calculation (min)  39 min    Activity Tolerance  Patient tolerated treatment well    Behavior During Therapy  [facility] for tasks assessed/performed;Anxious       Past Medical History:  Diagnosis Date  . Anxiety   . Arthritis   . Asthma   . Basal cell adenocarcinoma    nose  . Diabetes mellitus without complication ([place])   . History of kidney stones   . Hypertension   . PONV (postoperative nausea and vomiting)   . Sleep apnea    mild per pt-  no CPAP  . Stroke (cerebrum) [facility])     Past Surgical History:  Procedure Laterality Date  . ABDOMINAL HYSTERECTOMY    . APPENDECTOMY    . BREAST BIOPSY    . BREAST EXCISIONAL BIOPSY    . BREAST SURGERY     x 3  . CHOLECYSTECTOMY    . CYSTOSCOPY WITH RETROGRADE PYELOGRAM, URETEROSCOPY AND STENT PLACEMENT Right 12/23/2015   Procedure: CYSTOSCOPY WITH RETROGRADE PYELOGRAM, URETEROSCOPY AND STENT PLACEMENT;  Surgeon: Alexis Frock, MD;  [facility];  Service: Urology;  Laterality: Right;  . DIAGNOSTIC LAPAROSCOPY    . HOLMIUM LASER APPLICATION Right 16/03/930   Procedure: HOLMIUM LASER APPLICATION;  Surgeon: Alexis Frock, MD;  [facility];  Service: Urology;  Laterality: Right;  . LOOP RECORDER INSERTION N/A  11/02/2017   Procedure: LOOP RECORDER INSERTION;  Surgeon: Thompson Grayer, MD;  Location: [place] CV LAB;  Service: Cardiovascular;  Laterality: N/A;  . salpingectomy with oophorectomy    . TEE WITHOUT CARDIOVERSION N/A 11/02/2017   Procedure: TRANSESOPHAGEAL ECHOCARDIOGRAM (TEE);  Surgeon: Fay Records, MD;  Location: [facility] ENDOSCOPY;  Service: Cardiovascular;  Laterality: N/A;  loop recorder, bubble study    Vitals:   11/08/17 0755  BP: 138/72  Pulse: 75    Subjective Assessment - 11/08/17 0756    Subjective   allergy to ivory soap and gave her hives and had to take steroid injection happened 11/02/17, received loop recorder 11/02/17    Patient is accompained by:  Family member   dtr present at beginning of session   Pertinent History  anxiety, arthritis, asthma, DM, hx of kidney stones, sleep apnea, HTN, hx of basal cell adenocarcinoma    Limitations  **check BP    Currently in Pain?  No/denies       Gripper set at level 1 to pick up 1 inch blocks for sustained grip strength, 1-2 rest breaks, min-mod difficulty/ fatigue.   Standing to place and remove graded clothespins (1-8lbs resistance) from vertical pole for incr activity tolerance     Mid range functional reaching  in standing to place small pegs in vertical pegboard for incr activity tolerance and coordination (attempted high level reaching, but pulled at chest where loop recorder is, so lowered to mid-level).  Arm bike x40min forward/backward without rest for conditioning/reciprocal movement level 1  Placing [place] pegs in pegboard with tweezers for incr coordination and finger strength with mod difficulty due to fatigue                    OT Short Term Goals - 10/16/17 2246      OT SHORT TERM GOAL #1   Title  Pt will be independent with initial HEP for strength and coordination.--check STGs 11/15/17    Time  4    Period  Weeks    Status  New      OT SHORT TERM GOAL #2   Title  Pt will demo improved  strength to be able to retrieve 3lb object from overhead shelf with LUE x5 with good control.    Time  4    Period  Weeks    Status  New      OT SHORT TERM GOAL #3   Title  Pt will improve L grip strength by at least 6lbs for opening containers and lifting tasks.    Baseline  30lbs    Time  4    Period  Weeks    Status  New        OT Long Term Goals - 10/24/17 0904      OT LONG TERM GOAL #1   Title  Pt will be independent with updated HEP.--check LTGs 12/15/17    Time  8    Period  Weeks    Status  New      OT LONG TERM GOAL #2   Title  Pt will demo improved strength to be able to retrieve 4-5lb object from overhead shelf with LUE x5 with good control.    Time  8    Period  Weeks    Status  New      OT LONG TERM GOAL #3   Title  Pt will improve L grip strength by at least 12lbs for opening containers and lifting tasks.    Baseline  30lbs    Time  8    Period  Weeks    Status  New      OT LONG TERM GOAL #4   Title  Pt will improve strength, balance, and activity tolerance return to previous cooking/home maintenance tasks mod I.    Time  8    Period  Weeks    Status  New            Plan - 11/08/17 0757    Clinical Impression Statement  Pt is progressing towards goals with improving activity tolerance.  Pt reports that she has resumed cooking.    Occupational performance deficits (Please refer to evaluation for details):  ADL's;IADL's;Leisure;Social Participation    Rehab Potential  Good    OT Frequency  2x / week    OT Duration  8 weeks    OT Treatment/Interventions  Self-care/ADL training;Cryotherapy;Paraffin;Therapeutic exercise;DME and/or AE instruction;Functional Mobility Training;Visual/perceptual remediation/compensation;Manual Therapy;Neuromuscular education;Fluidtherapy;Ultrasound;Moist Heat;Energy conservation;Passive range of motion;Therapeutic activities;Patient/family education    Plan  activity tolerance for IADLs, dynamic reaching     Consulted and  Agree with Plan of Care  Patient       Patient will benefit from skilled therapeutic intervention in order to improve the following deficits and  impairments:  Decreased balance, Decreased endurance, Decreased mobility, Decreased activity tolerance, Decreased coordination, Decreased knowledge of use of DME, Decreased strength, Impaired UE functional use  Visit Diagnosis: Hemiplegia and hemiparesis following cerebral infarction affecting left non-dominant side (HCC)  Other lack of coordination  Muscle weakness (generalized)  Unsteadiness on feet  Other abnormalities of gait and mobility    Problem List Patient Active Problem List   Diagnosis Date Noted  . Anxiety 09/30/2017  . OSA on CPAP 09/30/2017  . Obesity 09/30/2017  . Essential hypertension 09/30/2017  . Diabetes mellitus type 2 in obese ([place]) 09/30/2017  . Dysphagia 09/30/2017  . CVA (cerebral vascular accident) ([place]) 09/30/2017  . Left-sided weakness 09/29/2017  . Abdominal pain, left lower quadrant 05/01/2012    [facility] 11/08/2017, 3:22 PM  [facility] [address] [place], [place], [address] Phone: [phone]   Fax:  [phone]  Name: INDIANNA BORAN MRN: [id number] Date of Birth: May 01, 1950   [name], OTR/L [facility] [address] [place], [place]  [address] [phone] phone [phone] 11/08/17 3:22 PM

## 2017-11-14 ENCOUNTER — Ambulatory Visit: Payer: Medicare Other | Attending: Family Medicine | Admitting: Physical Therapy

## 2017-11-14 ENCOUNTER — Ambulatory Visit: Payer: Medicare Other | Admitting: Occupational Therapy

## 2017-11-14 ENCOUNTER — Encounter: Payer: Self-pay | Admitting: Physical Therapy

## 2017-11-14 ENCOUNTER — Encounter: Payer: Self-pay | Admitting: Occupational Therapy

## 2017-11-14 VITALS — BP 144/75 | HR 69

## 2017-11-14 DIAGNOSIS — I69354 Hemiplegia and hemiparesis following cerebral infarction affecting left non-dominant side: Secondary | ICD-10-CM

## 2017-11-14 DIAGNOSIS — R278 Other lack of coordination: Secondary | ICD-10-CM

## 2017-11-14 DIAGNOSIS — M6281 Muscle weakness (generalized): Secondary | ICD-10-CM | POA: Diagnosis not present

## 2017-11-14 DIAGNOSIS — R2689 Other abnormalities of gait and mobility: Secondary | ICD-10-CM

## 2017-11-14 DIAGNOSIS — R2681 Unsteadiness on feet: Secondary | ICD-10-CM | POA: Diagnosis not present

## 2017-11-14 NOTE — Therapy (Signed)
[facility] [address] [place], [place], [address] Phone: [phone]   Fax:  [phone]  Occupational Therapy Treatment  Patient Details  Name: Christine Navarro MRN: [id number] Date of Birth: 1950/07/27 Referring Provider: Mayra Neer, MD - PCP.  Referred to therapy by hospitalist   Encounter Date: 11/14/2017  OT End of Session - 11/14/17 0939    Visit Number  6    Number of Visits  17    Date for OT Re-Evaluation  12/15/17    Authorization Type  Medicare, BCBS    Authorization Time Period  cert. date 10/16/17-01/14/18    Authorization - Visit Number  6    Authorization - Number of Visits  10    OT Start Time  4665    OT Stop Time  9935    OT Time Calculation (min)  40 min    Activity Tolerance  Patient tolerated treatment well    Behavior During Therapy  [facility] for tasks assessed/performed;Anxious       Past Medical History:  Diagnosis Date  . Anxiety   . Arthritis   . Asthma   . Basal cell adenocarcinoma    nose  . Diabetes mellitus without complication ([place])   . History of kidney stones   . Hypertension   . PONV (postoperative nausea and vomiting)   . Sleep apnea    mild per pt-  no CPAP  . Stroke (cerebrum) [facility])     Past Surgical History:  Procedure Laterality Date  . ABDOMINAL HYSTERECTOMY    . APPENDECTOMY    . BREAST BIOPSY    . BREAST EXCISIONAL BIOPSY    . BREAST SURGERY     x 3  . CHOLECYSTECTOMY    . CYSTOSCOPY WITH RETROGRADE PYELOGRAM, URETEROSCOPY AND STENT PLACEMENT Right 12/23/2015   Procedure: CYSTOSCOPY WITH RETROGRADE PYELOGRAM, URETEROSCOPY AND STENT PLACEMENT;  Surgeon: Alexis Frock, MD;  Location: [facility];  Service: Urology;  Laterality: Right;  . DIAGNOSTIC LAPAROSCOPY    . HOLMIUM LASER APPLICATION Right 70/17/7939   Procedure: HOLMIUM LASER APPLICATION;  Surgeon: Alexis Frock, MD;  Location: [facility];  Service: Urology;  Laterality: Right;  . LOOP RECORDER INSERTION N/A  11/02/2017   Procedure: LOOP RECORDER INSERTION;  Surgeon: Thompson Grayer, MD;  Location: [facility];  Service: Cardiovascular;  Laterality: N/A;  . salpingectomy with oophorectomy    . TEE WITHOUT CARDIOVERSION N/A 11/02/2017   Procedure: TRANSESOPHAGEAL ECHOCARDIOGRAM (TEE);  Surgeon: Fay Records, MD;  Location: [facility] ENDOSCOPY;  Service: Cardiovascular;  Laterality: N/A;  loop recorder, bubble study    Vitals:   11/14/17 0937  BP: (!) 144/75  Pulse: 69    Subjective Assessment - 11/14/17 0935    Subjective   has had some pain in her back    Patient is accompained by:  Family member    Pertinent History  anxiety, arthritis, asthma, DM, hx of kidney stones, sleep apnea, HTN, hx of basal cell adenocarcinoma    Limitations  **check BP    Currently in Pain?  No/denies       Gripper set at level 2 to pick up 1 inch blocks for sustained grip strength, mod difficulty/ fatigue, reduced to level 1 after approx 10 block to complete remaining with min-mod difficulty/drops  Standing to place and remove graded clothespins (1-8lbs resistance) from vertical pole for incr activity tolerance     Reviewed yellow theraband HEP.--pt returned demo x15 each with min v.c. To slow  down (horizontal abduction with LUE only due to pre-existing R scapular pain).  High range functional reaching in standing to place small pegs in vertical pegboard for incr activity tolerance and coordination  Arm bike x28min forward/backward without rest for conditioning/reciprocal movement level 3  Simulated walking dog with theraband in clinic with no LOB.    OT Short Term Goals - 11/14/17 0940      OT SHORT TERM GOAL #1   Title  Pt will be independent with initial HEP for strength and coordination.--check STGs 11/15/17    Time  4    Period  Weeks    Status  Achieved      OT SHORT TERM GOAL #2   Title  Pt will demo improved strength to be able to retrieve 3lb object from overhead shelf with LUE x5 with good  control.    Time  4    Period  Weeks    Status  New      OT SHORT TERM GOAL #3   Title  Pt will improve L grip strength by at least 6lbs for opening containers and lifting tasks.    Baseline  30lbs    Time  4    Period  Weeks    Status  Achieved   11/14/17:  38lbs       OT Long Term Goals - 10/24/17 0904      OT LONG TERM GOAL #1   Title  Pt will be independent with updated HEP.--check LTGs 12/15/17    Time  8    Period  Weeks    Status  New      OT LONG TERM GOAL #2   Title  Pt will demo improved strength to be able to retrieve 4-5lb object from overhead shelf with LUE x5 with good control.    Time  8    Period  Weeks    Status  New      OT LONG TERM GOAL #3   Title  Pt will improve L grip strength by at least 12lbs for opening containers and lifting tasks.    Baseline  30lbs    Time  8    Period  Weeks    Status  New      OT LONG TERM GOAL #4   Title  Pt will improve strength, balance, and activity tolerance return to previous cooking/home maintenance tasks mod I.    Time  8    Period  Weeks    Status  New            Plan - 11/14/17 6712    Clinical Impression Statement  Pt is progressing towards goals with improving activity tolerance and strength.  Pt reports that she has done some supervised driving and that it went well and that she did some freezer cooking this weekend.    Occupational performance deficits (Please refer to evaluation for details):  ADL's;IADL's;Leisure;Social Participation    Rehab Potential  Good    OT Frequency  2x / week    OT Duration  8 weeks    OT Treatment/Interventions  Self-care/ADL training;Cryotherapy;Paraffin;Therapeutic exercise;DME and/or AE instruction;Functional Mobility Training;Visual/perceptual remediation/compensation;Manual Therapy;Neuromuscular education;Fluidtherapy;Ultrasound;Moist Heat;Energy conservation;Passive range of motion;Therapeutic activities;Patient/family education    Plan  activity tolerance for IADLs,  dynamic reaching, check STG #2    Consulted and Agree with Plan of Care  Patient       Patient will benefit from skilled therapeutic intervention in order to improve the following deficits and  impairments:  Decreased balance, Decreased endurance, Decreased mobility, Decreased activity tolerance, Decreased coordination, Decreased knowledge of use of DME, Decreased strength, Impaired UE functional use  Visit Diagnosis: Hemiplegia and hemiparesis following cerebral infarction affecting left non-dominant side (HCC)  Other lack of coordination  Muscle weakness (generalized)  Unsteadiness on feet  Other abnormalities of gait and mobility    Problem List Patient Active Problem List   Diagnosis Date Noted  . Anxiety 09/30/2017  . OSA on CPAP 09/30/2017  . Obesity 09/30/2017  . Essential hypertension 09/30/2017  . Diabetes mellitus type 2 in obese ([place]) 09/30/2017  . Dysphagia 09/30/2017  . CVA (cerebral vascular accident) ([place]) 09/30/2017  . Left-sided weakness 09/29/2017  . Abdominal pain, left lower quadrant 05/01/2012    [facility] 11/14/2017, 12:55 PM  [facility] [address] [place], [place], [address] Phone: [phone]   Fax:  [phone]  Name: Christine Navarro MRN: [id number] Date of Birth: 31-Jul-1950   [name], OTR/L [facility] [address] [place], [place]  [address] [phone] phone [phone] 11/14/17 12:55 PM

## 2017-11-14 NOTE — Therapy (Signed)
[facility] [address] [place], [place], [address] Phone: [phone]   Fax:  [phone]  Physical Therapy Treatment  Patient Details  Name: Christine Navarro MRN: [id number] Date of Birth: [birth year] Referring Provider: Mayra Neer, MD - PCP.  Referred to therapy by hospitalist   Encounter Date: 11/14/2017  PT End of Session - 11/14/17 0806    Visit Number  4    Number of Visits  13    Date for PT Re-Evaluation  60/10/93   cert written for 60 days (12/15/17)   Authorization Type  Medicare and BCBS - 10th visit note routed to PCP    PT Start Time  0804    PT Stop Time  0845    PT Time Calculation (min)  41 min    Equipment Utilized During Treatment  Gait belt    Activity Tolerance  Patient tolerated treatment well;Patient limited by fatigue    Behavior During Therapy  Flat affect;WFL for tasks assessed/performed       Past Medical History:  Diagnosis Date  . Anxiety   . Arthritis   . Asthma   . Basal cell adenocarcinoma    nose  . Diabetes mellitus without complication ([place])   . History of kidney stones   . Hypertension   . PONV (postoperative nausea and vomiting)   . Sleep apnea    mild per pt-  no CPAP  . Stroke (cerebrum) [facility])     Past Surgical History:  Procedure Laterality Date  . ABDOMINAL HYSTERECTOMY    . APPENDECTOMY    . BREAST BIOPSY    . BREAST EXCISIONAL BIOPSY    . BREAST SURGERY     x 3  . CHOLECYSTECTOMY    . CYSTOSCOPY WITH RETROGRADE PYELOGRAM, URETEROSCOPY AND STENT PLACEMENT Right 12/23/2015   Procedure: CYSTOSCOPY WITH RETROGRADE PYELOGRAM, URETEROSCOPY AND STENT PLACEMENT;  Surgeon: Alexis Frock, MD;  [facility];  Service: Urology;  Laterality: Right;  . DIAGNOSTIC LAPAROSCOPY    . HOLMIUM LASER APPLICATION Right 23/55/7322   Procedure: HOLMIUM LASER APPLICATION;  Surgeon: Alexis Frock, MD;  [facility];  Service: Urology;  Laterality: Right;  . LOOP RECORDER  INSERTION N/A 11/02/2017   Procedure: LOOP RECORDER INSERTION;  Surgeon: Thompson Grayer, MD;  Location: [facility];  Service: Cardiovascular;  Laterality: N/A;  . salpingectomy with oophorectomy    . TEE WITHOUT CARDIOVERSION N/A 11/02/2017   Procedure: TRANSESOPHAGEAL ECHOCARDIOGRAM (TEE);  Surgeon: Fay Records, MD;  Location: [facility] END[facility];  Service: Cardiovascular;  Laterality: N/A;  loop recorder, bubble study    There were no vitals filed for this visit.  Subjective Assessment - 11/14/17 0805    Subjective  No new complaints. No falls or pain to report at this time, does have occasional back pain with certain movements.     Pertinent History  anxiety, OSA on CPAP, essential HTN, DM and obesity    Limitations  Walking    Patient Stated Goals  To improve strength and balance    Currently in Pain?  No/denies    Pain Score  0-No pain         [facility] PT Assessment - 11/14/17 0807      Standardized Balance Assessment   Standardized Balance Assessment  Berg Balance Test;Five Times Sit to Stand;10 meter walk test    Five times sit to stand comments   11.56 sec's no UE support, using standard height chair    10 Meter  Walk  10.34 sec's= 3.17 ft/sec with hurry cane      Berg Balance Test   Sit to Stand  Able to stand without using hands and stabilize independently    Standing Unsupported  Able to stand safely 2 minutes    Sitting with Back Unsupported but Feet Supported on Floor or Stool  Able to sit safely and securely 2 minutes    Stand to Sit  Sits safely with minimal use of hands    Transfers  Able to transfer safely, minor use of hands    Standing Unsupported with Eyes Closed  Able to stand 10 seconds safely    Standing Ubsupported with Feet Together  Able to place feet together independently and stand 1 minute safely    From Standing, Reach Forward with Outstretched Arm  Can reach confidently >25 cm (10")    From Standing Position, Pick up Object from Floor  Able to pick up  shoe safely and easily    From Standing Position, Turn to Look Behind Over each Shoulder  Looks behind from both sides and weight shifts well    Turn 360 Degrees  Able to turn 360 degrees safely but slowly   >4 sec's both ways   Standing Unsupported, Alternately Place Feet on Step/Stool  Able to stand independently and safely and complete 8 steps in 20 seconds    Standing Unsupported, One Foot in Front  Able to plae foot ahead of the other independently and hold 30 seconds    Standing on One Leg  Able to lift leg independently and hold > 10 seconds    Total Score  53    Berg comment:  53/56 lower risk for falls         [facility] Adult PT Treatment/Exercise - 11/14/17 1027      Transfers   Transfers  Sit to Stand;Stand to Sit    Sit to Stand  6: Modified independent (Device/Increase time);Without upper extremity assist;From bed    Stand to Sit  6: Modified independent (Device/Increase time);Without upper extremity assist;To bed      Ambulation/Gait   Ambulation/Gait  Yes    Ambulation/Gait Assistance  5: Supervision    Ambulation/Gait Assistance Details  cues on posture and cane placement. no balance loss noted.     Ambulation Distance (Feet)  500 Feet    Assistive device  Straight cane   hurrycane   Gait Pattern  Step-through pattern;Decreased stance time - left;Decreased stride length;Decreased weight shift to left    Ambulation Surface  Level;Unlevel;Indoor;Outdoor;Paved          Balance Exercises - 11/14/17 0825      Balance Exercises: Standing   Standing Eyes Closed  Narrow base of support (BOS);Foam/compliant surface;2 reps;30 secs;Limitations    Rockerboard  Anterior/posterior;Lateral;Head turns;EO;EC;30 seconds;10 reps    Sit to Stand Time  with feet on airex no UE used x 10 reps, cues for full upright posture and slow, controlled descent with sitting down.       Balance Exercises: Standing   Standing Eyes Closed Limitations  on airex with no UE support: feet close  together EC no head movements for 30 sec's x 2 reps    Rebounder Limitations  performed on balance board in both directions with no UE support: rocking the board with emphasis on tall posture with EO, progressing to EC. then holding the board steady: EC no head movements, progressing to EC head movements left<>right, up<>down. min guard to min assist for balance  with cues on posture and weight shifting to assist with balance recovery.            PT Short Term Goals - 11/14/17 0807      PT SHORT TERM GOAL #1   Title  Pt will participate in assessment of DGI. (ALL STGs DUE 11/07/17)    Baseline  11/01/17: DGI performed this date and goals updated with baseline values.     Status  Achieved      PT SHORT TERM GOAL #2   Title  Pt will initiate gait training with cane indoors and outdoors to decrease falls risk    Baseline  11/14/17: met to date    Time  --    Period  --    Status  Achieved      PT SHORT TERM GOAL #3   Title  Pt will improve LE strength as indicated by decrease in five time sit to stand to </= 16 seconds with more normal BOS    Baseline  11/14/17: 11.56 sesc's no UE assist using standard height chair    Time  --    Period  --    Status  Achieved      PT SHORT TERM GOAL #4   Title  Pt will improve BERG score to >/= 48/56 to decrease falls risk    Baseline  11/14/17: 53/56 scored today    Time  --    Period  --    Status  Achieved      PT SHORT TERM GOAL #5   Title  Pt will increase gait velocity to >2.62 ft/sec with use of cane    Baseline  11/14/17: 3.17 ft/sec with hurrycane    Time  --    Period  --    Status  Achieved        PT Long Term Goals - 11/14/17 1030      PT LONG TERM GOAL #1   Title  Pt will be independent with HEP and community wellness (water aerobics?) (ALL LTGs DUE 11/30/17)    Time  6    Period  Weeks    Status  On-going      PT LONG TERM GOAL #2   Title  Pt will improve DGI score by 4 points    Baseline  11/01/17: 19/24 scored today for  baseline    Time  6    Period  Weeks    Status  On-going      PT LONG TERM GOAL #3   Title  Pt will improve five time sit to stand to </= 13 seconds without UE use    Baseline  11/14/17: 11.56 sec's no UE support using standard height chair    Status  Achieved      PT LONG TERM GOAL #4   Title  Pt will improve BERG to >/= 53/56 to indicated lower risk for falls    Baseline  11/14/17: 53/56 scored today    Status  Achieved      PT LONG TERM GOAL #5   Title  Pt will improve gait velocity to >/= 3.0 ft/sec with LRAD     Baseline  11/14/17: 3.17 ft/sec with hurrycane    Status  Achieved      PT LONG TERM GOAL #6   Title  Pt will ambulate 1000' over outdoor paved and grassy surfaces MOD I with LRAD to inidicate safe return to dog walking.    Time  6  Period  Weeks    Status  On-going            Plan - 11/14/17 0806    Clinical Impression Statement  Today's skilled session focused on progress toward STGs with all STGs met and LTGs for 5 time sit to stand, Berg Balance Test and 10 meter gait speed also met. Remainder of session continued to address high level balance reactions. The pt is progressing well and should benefit from continued PT to progress toward unmet goals.                             Rehab Potential  Good    PT Frequency  2x / week    PT Duration  6 weeks   cert written for 60 days   PT Treatment/Interventions  ADLs/Self Care Home Management;Aquatic Therapy;Electrical Stimulation;DME Instruction;Gait training;Functional mobility training;Therapeutic activities;Therapeutic exercise;Balance training;Neuromuscular re-education;Patient/family education;Vestibular    PT Next Visit Plan  continue to work on LE strengthening being mindful of pt's knees (OA), balance on compliant surfaces- single leg and/or with vision removed. gait with Hurrycane outdoors, ramps, curbs    PT Home Exercise Plan  11/01/17: has been walking daily. [employer] HEP issued with Access Code: [email or web address].     Consulted and Agree with Plan of Care  Patient;Family member/caregiver    Family Member Consulted  daughter       Patient will benefit from skilled therapeutic intervention in order to improve the following deficits and impairments:  Decreased balance, Dizziness, Difficulty walking, Decreased strength, Impaired sensation  Visit Diagnosis: Hemiplegia and hemiparesis following cerebral infarction affecting left non-dominant side (HCC)  Unsteadiness on feet  Other abnormalities of gait and mobility     Problem List Patient Active Problem List   Diagnosis Date Noted  . Anxiety 09/30/2017  . OSA on CPAP 09/30/2017  . Obesity 09/30/2017  . Essential hypertension 09/30/2017  . Diabetes mellitus type 2 in obese ([place]) 09/30/2017  . Dysphagia 09/30/2017  . CVA (cerebral vascular accident) ([place]) 09/30/2017  . Left-sided weakness 09/29/2017  . Abdominal pain, left lower quadrant 05/01/2012    Willow Ora, PTA, [facility] [address] [place], [place] [address] [phone] 11/14/17, 10:34 AM   Name: MORAYMA [place]OWN MRN: [id number] Date of Birth: [birth year]

## 2017-11-15 ENCOUNTER — Ambulatory Visit (INDEPENDENT_AMBULATORY_CARE_PROVIDER_SITE_OTHER): Payer: Self-pay | Admitting: *Deleted

## 2017-11-15 DIAGNOSIS — I639 Cerebral infarction, unspecified: Secondary | ICD-10-CM

## 2017-11-15 LAB — CUP PACEART INCLINIC DEVICE CHECK
Date Time Interrogation Session: 20190904133034
MDC IDC PG IMPLANT DT: 20190822

## 2017-11-15 NOTE — Progress Notes (Signed)
Wound check appointment. Dermabond removed prior to appointment by patient. Wound without edema, slight erythema along incision borders. Incision edges approximated, wound healing well. JA assessed site, no additional recommendations at this time. Normal device function. Battery status: good. R-waves 0.53mV. No symptom, tachy, or AF episodes. Pause and brady detection off since implant. Patient educated about wound care and Carelink monitor. Monthly summary reports and ROV with JA PRN.

## 2017-11-16 ENCOUNTER — Encounter: Payer: Self-pay | Admitting: Physical Therapy

## 2017-11-16 ENCOUNTER — Encounter: Payer: Self-pay | Admitting: Occupational Therapy

## 2017-11-16 ENCOUNTER — Ambulatory Visit: Payer: Medicare Other | Admitting: Physical Therapy

## 2017-11-16 ENCOUNTER — Ambulatory Visit: Payer: Medicare Other | Admitting: Occupational Therapy

## 2017-11-16 VITALS — BP 146/87 | HR 69

## 2017-11-16 DIAGNOSIS — R2689 Other abnormalities of gait and mobility: Secondary | ICD-10-CM

## 2017-11-16 DIAGNOSIS — I69354 Hemiplegia and hemiparesis following cerebral infarction affecting left non-dominant side: Secondary | ICD-10-CM

## 2017-11-16 DIAGNOSIS — R2681 Unsteadiness on feet: Secondary | ICD-10-CM

## 2017-11-16 DIAGNOSIS — R278 Other lack of coordination: Secondary | ICD-10-CM

## 2017-11-16 DIAGNOSIS — M6281 Muscle weakness (generalized): Secondary | ICD-10-CM | POA: Diagnosis not present

## 2017-11-16 NOTE — Therapy (Signed)
[facility] [address] [place], [place], [address] Phone: [phone]   Fax:  [phone]  Physical Therapy Treatment  Patient Details  Name: Christine Navarro MRN: [id number] Date of Birth: [birth year] Referring Provider: Mayra Neer, MD - PCP.  Referred to therapy by hospitalist   Encounter Date: 11/16/2017  PT End of Session - 11/16/17 1708    Visit Number  5    Number of Visits  13    Date for PT Re-Evaluation  01/08/24   cert written for 60 days (12/15/17)   Authorization Type  Medicare and BCBS - 10th visit note routed to PCP    PT Start Time  0848    PT Stop Time  0930    PT Time Calculation (min)  42 min    Equipment Utilized During Treatment  --    Activity Tolerance  Patient tolerated treatment well;Patient limited by fatigue    Behavior During Therapy  Flat affect;WFL for tasks assessed/performed       Past Medical History:  Diagnosis Date  . Anxiety   . Arthritis   . Asthma   . Basal cell adenocarcinoma    nose  . Diabetes mellitus without complication ([place])   . History of kidney stones   . Hypertension   . PONV (postoperative nausea and vomiting)   . Sleep apnea    mild per pt-  no CPAP  . Stroke (cerebrum) [facility])     Past Surgical History:  Procedure Laterality Date  . ABDOMINAL HYSTERECTOMY    . APPENDECTOMY    . BREAST BIOPSY    . BREAST EXCISIONAL BIOPSY    . BREAST SURGERY     x 3  . CHOLECYSTECTOMY    . CYSTOSCOPY WITH RETROGRADE PYELOGRAM, URETEROSCOPY AND STENT PLACEMENT Right 12/23/2015   Procedure: CYSTOSCOPY WITH RETROGRADE PYELOGRAM, URETEROSCOPY AND STENT PLACEMENT;  Surgeon: Alexis Frock, MD;  Location: [facility];  Service: Urology;  Laterality: Right;  . DIAGNOSTIC LAPAROSCOPY    . HOLMIUM LASER APPLICATION Right 36/64/4034   Procedure: HOLMIUM LASER APPLICATION;  Surgeon: Alexis Frock, MD;  Location: [facility];  Service: Urology;  Laterality: Right;  . LOOP RECORDER  INSERTION N/A 11/02/2017   Procedure: LOOP RECORDER INSERTION;  Surgeon: Thompson Grayer, MD;  Location: [facility];  Service: Cardiovascular;  Laterality: N/A;  . salpingectomy with oophorectomy    . TEE WITHOUT CARDIOVERSION N/A 11/02/2017   Procedure: TRANSESOPHAGEAL ECHOCARDIOGRAM (TEE);  Surgeon: Fay Records, MD;  Location: [facility];  Service: Cardiovascular;  Laterality: N/A;  loop recorder, bubble study    There were no vitals filed for this visit.  Subjective Assessment - 11/16/17 0850    Subjective  Reports she has a history of rt mid-back pain (prior to CVA) however it has been worse recently (she questions due to weakness from CVA and moving differently). She reports it is worst when she tries to get up from lying down.     Pertinent History  anxiety, OSA on CPAP, essential HTN, DM and obesity    Limitations  Walking    Patient Stated Goals  To improve strength and balance    Currently in Pain?  Yes    Pain Score  4     Pain Location  Back    Pain Orientation  Right;Mid    Pain Descriptors / Indicators  Aching;Discomfort    Pain Type  Chronic pain    Pain Radiating Towards  radiates forward around torso  Pain Onset  More than a month ago    Aggravating Factors   lying and shifting, rolling    Pain Relieving Factors  tylenol                        [facility] Adult PT Treatment/Exercise - 11/16/17 0001      Bed Mobility   Bed Mobility  Left Sidelying to Sit    Left Sidelying to Sit  Minimal Assistance - Patient >75%;Supervision/Verbal cueing    Left Sidelying to Sit Details (indicate cue type and reason)  pt reports this movement is most painful to her rt mid-back; observed pt tries to pull herself up/lead with her head. Educated to let her hip/pelvis "reach" for the surface with head lagging and coming up last with pt reporting significantly less pain; repeated x 3 with less assist each time      Ambulation/Gait   Ambulation/Gait Assistance  6:  Modified independent (Device/Increase time)    Ambulation/Gait Assistance Details  no LOB over very uneven terrain (grass) while tossing and catching a ball while stating how to make meatloaf    Ambulation Distance (Feet)  500 Feet    Assistive device  None    Gait Pattern  Within Functional Limits    Ambulation Surface  Unlevel;Outdoor;Grass;Other (comment)   rubber mulch     Exercises   Exercises  Lumbar      Lumbar Exercises: Stretches   Single Knee to Chest Stretch  Right;30 seconds   only felt in mid-back if she lifted chin to chest   Lower Trunk Rotation  2 reps;30 seconds    Lower Trunk Rotation Limitations  with knees flexed, then legs crossed & flexed and did not get to the mid-back area    Other Lumbar Stretch Exercise  standing RUE holding onto lip of sink, rotate upper body towards her right arm while leaning posterior to stretch mid-back (this stretch was most effective at targeting area in her back that is hurting)          Balance Exercises - 11/16/17 1641      Balance Exercises: Standing   Standing Eyes Opened  Narrow base of support (BOS);Foam/compliant surface   on ramp; feet together and partial heel toe; UE movments   Standing Eyes Closed  Narrow base of support (BOS);Foam/compliant surface   blue mat on ramp; alternating reaching; ft tog, patial heel    Other Standing Exercises  on rubber mulch, partial heel toe with LLE in back; catch and throw 12" ball with PT        PT Education - 11/16/17 1706    Education Details  bed mobility and thoracic stretches to decrease pain which is impacting her sleep    Person(s) Educated  Patient    Methods  Explanation;Demonstration;Tactile cues;Verbal cues    Comprehension  Verbalized understanding;Returned demonstration;Verbal cues required;Tactile cues required       PT Short Term Goals - 11/14/17 0807      PT SHORT TERM GOAL #1   Title  Pt will participate in assessment of DGI. (ALL STGs DUE 11/07/17)     Baseline  11/01/17: DGI performed this date and goals updated with baseline values.     Status  Achieved      PT SHORT TERM GOAL #2   Title  Pt will initiate gait training with cane indoors and outdoors to decrease falls risk    Baseline  11/14/17: met to date  Time  --    Period  --    Status  Achieved      PT SHORT TERM GOAL #3   Title  Pt will improve LE strength as indicated by decrease in five time sit to stand to </= 16 seconds with more normal BOS    Baseline  11/14/17: 11.56 sesc's no UE assist using standard height chair    Time  --    Period  --    Status  Achieved      PT SHORT TERM GOAL #4   Title  Pt will improve BERG score to >/= 48/56 to decrease falls risk    Baseline  11/14/17: 53/56 scored today    Time  --    Period  --    Status  Achieved      PT SHORT TERM GOAL #5   Title  Pt will increase gait velocity to >2.62 ft/sec with use of cane    Baseline  11/14/17: 3.17 ft/sec with hurrycane    Time  --    Period  --    Status  Achieved        PT Long Term Goals - 11/14/17 1030      PT LONG TERM GOAL #1   Title  Pt will be independent with HEP and community wellness (water aerobics?) (ALL LTGs DUE 11/30/17)    Time  6    Period  Weeks    Status  On-going      PT LONG TERM GOAL #2   Title  Pt will improve DGI score by 4 points    Baseline  11/01/17: 19/24 scored today for baseline    Time  6    Period  Weeks    Status  On-going      PT LONG TERM GOAL #3   Title  Pt will improve five time sit to stand to </= 13 seconds without UE use    Baseline  11/14/17: 11.56 sec's no UE support using standard height chair    Status  Achieved      PT LONG TERM GOAL #4   Title  Pt will improve BERG to >/= 53/56 to indicated lower risk for falls    Baseline  11/14/17: 53/56 scored today    Status  Achieved      PT LONG TERM GOAL #5   Title  Pt will improve gait velocity to >/= 3.0 ft/sec with LRAD     Baseline  11/14/17: 3.17 ft/sec with hurrycane    Status  Achieved       PT LONG TERM GOAL #6   Title  Pt will ambulate 1000' over outdoor paved and grassy surfaces MOD I with LRAD to inidicate safe return to dog walking.    Time  6    Period  Weeks    Status  On-going            Plan - 11/16/17 1709    Clinical Impression Statement  Session initially focused on the increased back pain she is reporting (chronic however has been increasing since CVA and now effecting her sleep). Instructed in stretches and rationale for warming up muscles prior to stretching and effect of this routine on breaking pain cycle. Addressed balance on compliant surfaces, eyes closed, and while multi-tasking. Patient did well with all balance activities. She can continue to benefit from PT for LE strengthening, however anticipate she will not need the anticipated # visits requested.  Rehab Potential  Good    PT Frequency  2x / week    PT Duration  6 weeks   cert written for 60 days   PT Treatment/Interventions  ADLs/Self Care Home Management;Aquatic Therapy;Electrical Stimulation;DME Instruction;Gait training;Functional mobility training;Therapeutic activities;Therapeutic exercise;Balance training;Neuromuscular re-education;Patient/family education;Vestibular    PT Next Visit Plan  continue to work on LE strengthening being mindful of pt's knees (OA), balance on compliant surfaces- single leg and/or with vision removed (she did REALLY well with higher level tasks 9/5). gait with/without Hurrycane outdoors, ramps, curbs    PT Home Exercise Plan  11/01/17: has been walking daily. [employer] HEP issued with Access Code: [email or web address].    Consulted and Agree with Plan of Care  Patient;Family member/caregiver       Patient will benefit from skilled therapeutic intervention in order to improve the following deficits and impairments:  Decreased balance, Dizziness, Difficulty walking, Decreased strength, Impaired sensation  Visit Diagnosis: Hemiplegia and hemiparesis following cerebral  infarction affecting left non-dominant side (HCC)  Muscle weakness (generalized)  Unsteadiness on feet     Problem List Patient Active Problem List   Diagnosis Date Noted  . Anxiety 09/30/2017  . OSA on CPAP 09/30/2017  . Obesity 09/30/2017  . Essential hypertension 09/30/2017  . Diabetes mellitus type 2 in obese ([place]) 09/30/2017  . Dysphagia 09/30/2017  . CVA (cerebral vascular accident) ([place]) 09/30/2017  . Left-sided weakness 09/29/2017  . Abdominal pain, left lower quadrant 05/01/2012    Rexanne Mano, PT 11/16/2017, 5:22 PM  [facility] [address] [place], [place], [address] Phone: [phone]   Fax:  [phone]  Name: JAYDE MCALLISTER MRN: [id number] Date of Birth: 1950-06-03

## 2017-11-16 NOTE — Therapy (Signed)
[facility] [address] [place], [place], [address] Phone: [phone]   Fax:  [phone]  Occupational Therapy Treatment  Patient Details  Name: Christine Navarro MRN: [id number] Date of Birth: June 11, 1950 Referring Provider: Mayra Neer, MD - PCP.  Referred to therapy by hospitalist   Encounter Date: 11/16/2017  OT End of Session - 11/16/17 0801    Visit Number  7    Number of Visits  17    Date for OT Re-Evaluation  12/15/17    Authorization Type  Medicare, BCBS    Authorization Time Period  cert. date 10/16/17-01/14/18    Authorization - Visit Number  7    Authorization - Number of Visits  10    OT Start Time  6759    OT Stop Time  0835    OT Time Calculation (min)  40 min    Activity Tolerance  Patient tolerated treatment well    Behavior During Therapy  [facility] for tasks assessed/performed;Anxious       Past Medical History:  Diagnosis Date  . Anxiety   . Arthritis   . Asthma   . Basal cell adenocarcinoma    nose  . Diabetes mellitus without complication ([place])   . History of kidney stones   . Hypertension   . PONV (postoperative nausea and vomiting)   . Sleep apnea    mild per pt-  no CPAP  . Stroke (cerebrum) [facility])     Past Surgical History:  Procedure Laterality Date  . ABDOMINAL HYSTERECTOMY    . APPENDECTOMY    . BREAST BIOPSY    . BREAST EXCISIONAL BIOPSY    . BREAST SURGERY     x 3  . CHOLECYSTECTOMY    . CYSTOSCOPY WITH RETROGRADE PYELOGRAM, URETEROSCOPY AND STENT PLACEMENT Right 12/23/2015   Procedure: CYSTOSCOPY WITH RETROGRADE PYELOGRAM, URETEROSCOPY AND STENT PLACEMENT;  Surgeon: Alexis Frock, MD;  [facility];  Service: Urology;  Laterality: Right;  . DIAGNOSTIC LAPAROSCOPY    . HOLMIUM LASER APPLICATION Right 16/38/4665   Procedure: HOLMIUM LASER APPLICATION;  Surgeon: Alexis Frock, MD;  [facility];  Service: Urology;  Laterality: Right;  . LOOP RECORDER INSERTION N/A  11/02/2017   Procedure: LOOP RECORDER INSERTION;  Surgeon: Thompson Grayer, MD;  Location: [facility];  Service: Cardiovascular;  Laterality: N/A;  . salpingectomy with oophorectomy    . TEE WITHOUT CARDIOVERSION N/A 11/02/2017   Procedure: TRANSESOPHAGEAL ECHOCARDIOGRAM (TEE);  Surgeon: Fay Records, MD;  Location: [facility] ENDOSCOPY;  Service: Cardiovascular;  Laterality: N/A;  loop recorder, bubble study    Vitals:   11/16/17 0800  BP: (!) 146/87  Pulse: 69    Subjective Assessment - 11/16/17 0800    Subjective   BP was high this morning, a little sore today    Patient is accompained by:  Family member    Pertinent History  anxiety, arthritis, asthma, DM, hx of kidney stones, sleep apnea, HTN, hx of basal cell adenocarcinoma    Limitations  **check BP    Currently in Pain?  No/denies       Checked BP at beginning of session--see above.  Gripper set at level 2 to pick up 1 inch blocks for sustained grip strength, mod-max difficulty/ fatigue, reduced to level 1 after approx 10 block to complete remaining with min-mod difficulty/drops    High range functional reaching in standing to place small pegs in vertical pegboard for incr activity tolerance and coordination with min  drops and no rest breaks.   In standing, dynamic functional reaching to place clothespins with 1-8lb resistance on vertical pole for incr balance for IADLs, incr activity tolerance, and incr coordination/strength (incorporating trunk rotation, wt. Shift to retrieve from various locations outside base of support/on floor).  With no LOB.  Placing [place]'connor pegs in pegboard with tweezers with min-mod difficulty for incr coordination/finger strength.  Removing pegs from red putty for incr strength.   In standing, floor>overhead with ball with BUEs followed by diagonal patterns for incr balance and activity tolerance.  Checked remaining STG--see below.                 OT Short Term Goals - 11/16/17  1507      OT SHORT TERM GOAL #1   Title  Pt will be independent with initial HEP for strength and coordination.--check STGs 11/15/17    Time  4    Period  Weeks    Status  Achieved      OT SHORT TERM GOAL #2   Title  Pt will demo improved strength to be able to retrieve 3lb object from overhead shelf with LUE x5 with good control.    Time  4    Period  Weeks    Status  Achieved   11/16/17     OT SHORT TERM GOAL #3   Title  Pt will improve L grip strength by at least 6lbs for opening containers and lifting tasks.    Baseline  30lbs    Time  4    Period  Weeks    Status  Achieved   11/14/17:  38lbs       OT Long Term Goals - 10/24/17 0904      OT LONG TERM GOAL #1   Title  Pt will be independent with updated HEP.--check LTGs 12/15/17    Time  8    Period  Weeks    Status  New      OT LONG TERM GOAL #2   Title  Pt will demo improved strength to be able to retrieve 4-5lb object from overhead shelf with LUE x5 with good control.    Time  8    Period  Weeks    Status  New      OT LONG TERM GOAL #3   Title  Pt will improve L grip strength by at least 12lbs for opening containers and lifting tasks.    Baseline  30lbs    Time  8    Period  Weeks    Status  New      OT LONG TERM GOAL #4   Title  Pt will improve strength, balance, and activity tolerance return to previous cooking/home maintenance tasks mod I.    Time  8    Period  Weeks    Status  New            Plan - 11/16/17 0802    Clinical Impression Statement  Pt is progressing slowly with improving activity tolerance, but reports incr soreness after last session.  All STGs met.    Occupational performance deficits (Please refer to evaluation for details):  ADL's;IADL's;Leisure;Social Participation    Rehab Potential  Good    OT Frequency  2x / week    OT Duration  8 weeks    OT Treatment/Interventions  Self-care/ADL training;Cryotherapy;Par[place];Therapeutic exercise;DME and/or AE instruction;Functional Mobility  Training;Visual/perceptual remediation/compensation;Manual Therapy;Neuromuscular education;Fluidtherapy;Ultrasound;Moist Heat;Energy conservation;Passive range of motion;Therapeutic activities;Patient/family education    Plan  activity tolerance for IADLs, dynamic reaching    Consulted and Agree with Plan of Care  Patient       Patient will benefit from skilled therapeutic intervention in order to improve the following deficits and impairments:  Decreased balance, Decreased endurance, Decreased mobility, Decreased activity tolerance, Decreased coordination, Decreased knowledge of use of DME, Decreased strength, Impaired UE functional use  Visit Diagnosis: Hemiplegia and hemiparesis following cerebral infarction affecting left non-dominant side (HCC)  Other lack of coordination  Muscle weakness (generalized)  Unsteadiness on feet  Other abnormalities of gait and mobility    Problem List Patient Active Problem List   Diagnosis Date Noted  . Anxiety 09/30/2017  . OSA on CPAP 09/30/2017  . Obesity 09/30/2017  . Essential hypertension 09/30/2017  . Diabetes mellitus type 2 in obese ([place]) 09/30/2017  . Dysphagia 09/30/2017  . CVA (cerebral vascular accident) ([place]) 09/30/2017  . Left-sided weakness 09/29/2017  . Abdominal pain, left lower quadrant 05/01/2012    [facility] 11/16/2017, 3:14 PM  [facility] [address] [place], [place], [address] Phone: [phone]   Fax:  [phone]  Name: Christine Navarro MRN: [id number] Date of Birth: 24-Aug-1950   [name], OTR/L [facility] [address] [place], [place]  [address] [phone] phone [phone] 11/16/17 3:14 PM

## 2017-11-20 ENCOUNTER — Encounter: Payer: Self-pay | Admitting: Physical Therapy

## 2017-11-20 ENCOUNTER — Ambulatory Visit: Payer: Medicare Other | Admitting: Physical Therapy

## 2017-11-20 ENCOUNTER — Encounter: Payer: Self-pay | Admitting: Occupational Therapy

## 2017-11-20 ENCOUNTER — Ambulatory Visit: Payer: Medicare Other | Admitting: Occupational Therapy

## 2017-11-20 VITALS — BP 125/83 | HR 68

## 2017-11-20 DIAGNOSIS — R2689 Other abnormalities of gait and mobility: Secondary | ICD-10-CM

## 2017-11-20 DIAGNOSIS — I69354 Hemiplegia and hemiparesis following cerebral infarction affecting left non-dominant side: Secondary | ICD-10-CM

## 2017-11-20 DIAGNOSIS — M6281 Muscle weakness (generalized): Secondary | ICD-10-CM

## 2017-11-20 DIAGNOSIS — R2681 Unsteadiness on feet: Secondary | ICD-10-CM | POA: Diagnosis not present

## 2017-11-20 DIAGNOSIS — R278 Other lack of coordination: Secondary | ICD-10-CM

## 2017-11-20 NOTE — Therapy (Signed)
[facility] [address] [place], [place], [address] Phone: [phone]   Fax:  [phone]  Physical Therapy Treatment  Patient Details  Name: Christine Navarro MRN: [id number] Date of Birth: July 11, 1950 Referring Provider: Mayra Neer, MD - PCP.  Referred to therapy by hospitalist   Encounter Date: 11/20/2017  PT End of Session - 11/20/17 0925    Visit Number  6    Number of Visits  13    Date for PT Re-Evaluation  11/30/17    Authorization Type  Medicare and BCBS - 10th visit note routed to PCP    PT Start Time  0845    PT Stop Time  0924    PT Time Calculation (min)  39 min    Behavior During Therapy  [facility] for tasks assessed/performed;Anxious       Past Medical History:  Diagnosis Date  . Anxiety   . Arthritis   . Asthma   . Basal cell adenocarcinoma    nose  . Diabetes mellitus without complication ([place])   . History of kidney stones   . Hypertension   . PONV (postoperative nausea and vomiting)   . Sleep apnea    mild per pt-  no CPAP  . Stroke (cerebrum) [facility])     Past Surgical History:  Procedure Laterality Date  . ABDOMINAL HYSTERECTOMY    . APPENDECTOMY    . BREAST BIOPSY    . BREAST EXCISIONAL BIOPSY    . BREAST SURGERY     x 3  . CHOLECYSTECTOMY    . CYSTOSCOPY WITH RETROGRADE PYELOGRAM, URETEROSCOPY AND STENT PLACEMENT Right 12/23/2015   Procedure: CYSTOSCOPY WITH RETROGRADE PYELOGRAM, URETEROSCOPY AND STENT PLACEMENT;  Surgeon: Alexis Frock, MD;  Location: [facility];  Service: Urology;  Laterality: Right;  . DIAGNOSTIC LAPAROSCOPY    . HOLMIUM LASER APPLICATION Right 10/93/2355   Procedure: HOLMIUM LASER APPLICATION;  Surgeon: Alexis Frock, MD;  Location: [facility];  Service: Urology;  Laterality: Right;  . LOOP RECORDER INSERTION N/A 11/02/2017   Procedure: LOOP RECORDER INSERTION;  Surgeon: Thompson Grayer, MD;  Location: [place] CV LAB;  Service: Cardiovascular;  Laterality: N/A;  .  salpingectomy with oophorectomy    . TEE WITHOUT CARDIOVERSION N/A 11/02/2017   Procedure: TRANSESOPHAGEAL ECHOCARDIOGRAM (TEE);  Surgeon: Fay Records, MD;  Location: [facility] ENDOSCOPY;  Service: Cardiovascular;  Laterality: N/A;  loop recorder, bubble study    There were no vitals filed for this visit.  Subjective Assessment - 11/20/17 0847    Subjective  Pt felt back pain was worse after therapy session last Thursday and pain started to ease off Saturday.  Generally back feels tight throughout the day  but continues to have pain when getting up from bed (pt is rolling first).    Patient is accompained by:  Family member    Pertinent History  anxiety, OSA on CPAP, essential HTN, DM and obesity    Limitations  Walking    Patient Stated Goals  To improve strength and balance    Currently in Pain?  Yes    Pain Score  4     Pain Location  Back    Pain Orientation  Right;Mid    Pain Descriptors / Indicators  Aching;Discomfort    Pain Frequency  Intermittent                       [facility] Adult PT Treatment/Exercise - 11/20/17 0001  Ambulation/Gait   Ambulation/Gait  Yes    Ambulation/Gait Assistance  6: Modified independent (Device/Increase time)    Ambulation/Gait Assistance Details  Working on warming up through trunk with arm swing and for R LE Activity tolerance/ strengtheninig    Ambulation Distance (Feet)  1000 Feet   about 10 min   Assistive device  None    Gait Pattern  Within Functional Limits    Ambulation Surface  Indoor;Outdoor;Paved;Level;Unlevel      Exercises   Exercises  Knee/Hip      Knee/Hip Exercises: Aerobic   Stepper  Sci fit seated stepper L=1.0 5 min + 3 min with rest break ; pillow placed behind  Back for support.         Balance Exercises - 11/20/17 0927      Balance Exercises: Standing   SLS with Vectors  Solid surface;Other reps (comment)   walking forward tapping cones, progressed with head turns       PT Education - 11/20/17  0928    Education Details  Benefits of walking program for warming up upper trunk, importance of armswing for normal trunk movment patterns with gait; gave handout for walking program.  Seated exercise machine as option for warmup and LE strengthening.    Person(s) Educated  Patient    Methods  Explanation;Demonstration;Handout;Verbal cues    Comprehension  Verbalized understanding;Returned demonstration       PT Short Term Goals - 11/14/17 0807      PT SHORT TERM GOAL #1   Title  Pt will participate in assessment of DGI. (ALL STGs DUE 11/07/17)    Baseline  11/01/17: DGI performed this date and goals updated with baseline values.     Status  Achieved      PT SHORT TERM GOAL #2   Title  Pt will initiate gait training with cane indoors and outdoors to decrease falls risk    Baseline  11/14/17: met to date    Time  --    Period  --    Status  Achieved      PT SHORT TERM GOAL #3   Title  Pt will improve LE strength as indicated by decrease in five time sit to stand to </= 16 seconds with more normal BOS    Baseline  11/14/17: 11.56 sesc's no UE assist using standard height chair    Time  --    Period  --    Status  Achieved      PT SHORT TERM GOAL #4   Title  Pt will improve BERG score to >/= 48/56 to decrease falls risk    Baseline  11/14/17: 53/56 scored today    Time  --    Period  --    Status  Achieved      PT SHORT TERM GOAL #5   Title  Pt will increase gait velocity to >2.62 ft/sec with use of cane    Baseline  11/14/17: 3.17 ft/sec with hurrycane    Time  --    Period  --    Status  Achieved        PT Long Term Goals - 11/14/17 1030      PT LONG TERM GOAL #1   Title  Pt will be independent with HEP and community wellness (water aerobics?) (ALL LTGs DUE 11/30/17)    Time  6    Period  Weeks    Status  On-going      PT LONG TERM GOAL #2  Title  Pt will improve DGI score by 4 points    Baseline  11/01/17: 19/24 scored today for baseline    Time  6    Period  Weeks     Status  On-going      PT LONG TERM GOAL #3   Title  Pt will improve five time sit to stand to </= 13 seconds without UE use    Baseline  11/14/17: 11.56 sec's no UE support using standard height chair    Status  Achieved      PT LONG TERM GOAL #4   Title  Pt will improve BERG to >/= 53/56 to indicated lower risk for falls    Baseline  11/14/17: 53/56 scored today    Status  Achieved      PT LONG TERM GOAL #5   Title  Pt will improve gait velocity to >/= 3.0 ft/sec with LRAD     Baseline  11/14/17: 3.17 ft/sec with hurrycane    Status  Achieved      PT LONG TERM GOAL #6   Title  Pt will ambulate 1000' over outdoor paved and grassy surfaces MOD I with LRAD to inidicate safe return to dog walking.    Time  6    Period  Weeks    Status  On-going            Plan - 11/20/17 0939    Clinical Impression Statement  Pt reported her back pain hurt worse after therapy last session.  This session focused on introducing pt to seated stepper machine for possible option for pt to work on warming up trunk and lower extremities to reduce pain and work on strengthening;  initiating walking program with cues to increase armswing for normal upper trunk movement; and SLS stance with head turns (pt performed at supervison level self correcting imbalances).                                                Rehab Potential  Good    PT Frequency  2x / week    PT Duration  6 weeks   cert written for 60 days   PT Treatment/Interventions  ADLs/Self Care Home Management;Aquatic Therapy;Electrical Stimulation;DME Instruction;Gait training;Functional mobility training;Therapeutic activities;Therapeutic exercise;Balance training;Neuromuscular re-education;Patient/family education;Vestibular    PT Next Visit Plan  continue to work on LE strengthening being mindful of pt's knees (OA), balance on compliant surfaces- single leg and/or with vision removed (she did REALLY well with higher level tasks 9/5). gait with/without  Hurrycane outdoors, ramps, curbs    PT Home Exercise Plan  11/01/17: has been walking daily. [employer] HEP issued with Access Code: [email or web address].    Consulted and Agree with Plan of Care  Patient;Family member/caregiver       Patient will benefit from skilled therapeutic intervention in order to improve the following deficits and impairments:  Decreased balance, Dizziness, Difficulty walking, Decreased strength, Impaired sensation  Visit Diagnosis: Hemiplegia and hemiparesis following cerebral infarction affecting left non-dominant side (HCC)  Muscle weakness (generalized)  Unsteadiness on feet     Problem List Patient Active Problem List   Diagnosis Date Noted  . Anxiety 09/30/2017  . OSA on CPAP 09/30/2017  . Obesity 09/30/2017  . Essential hypertension 09/30/2017  . Diabetes mellitus type 2 in obese ([place]) 09/30/2017  . Dysphagia 09/30/2017  . CVA (cerebral  vascular accident) ([place]) 09/30/2017  . Left-sided weakness 09/29/2017  . Abdominal pain, left lower quadrant 05/01/2012    Bjorn Loser, PTA  11/20/17, 9:51 AM [facility] [address] [place], [place], [address] Phone: [phone]   Fax:  [phone]  Name: Christine Navarro MRN: [id number] Date of Birth: 30-Nov-1950

## 2017-11-20 NOTE — Patient Instructions (Signed)
Walking Program:  Begin walking for exercise for 10-15 minutes, 1 times/day, most days/week.   Progress your walking program by adding 1-2 minutes to your routine each week, as tolerated. Be sure to wear good walking shoes, walk in a safe environment and only progress to your tolerance.       Reciprocal armswing

## 2017-11-20 NOTE — Therapy (Signed)
[facility] [address] [place], [place], [address] Phone: [phone]   Fax:  [phone]  Occupational Therapy Treatment  Patient Details  Name: Christine Navarro MRN: [id number] Date of Birth: 04/19/1950 Referring Provider: Mayra Neer, MD - PCP.  Referred to therapy by hospitalist   Encounter Date: 11/20/2017  OT End of Session - 11/20/17 0801    Visit Number  8    Number of Visits  17    Date for OT Re-Evaluation  12/15/17    Authorization Type  Medicare, BCBS    Authorization Time Period  cert. date 10/16/17-01/14/18    Authorization - Visit Number  8    Authorization - Number of Visits  10    OT Start Time  0750    OT Stop Time  0830    OT Time Calculation (min)  40 min    Activity Tolerance  Patient tolerated treatment well    Behavior During Therapy  [facility] for tasks assessed/performed;Anxious       Past Medical History:  Diagnosis Date  . Anxiety   . Arthritis   . Asthma   . Basal cell adenocarcinoma    nose  . Diabetes mellitus without complication ([place])   . History of kidney stones   . Hypertension   . PONV (postoperative nausea and vomiting)   . Sleep apnea    mild per pt-  no CPAP  . Stroke (cerebrum) [facility])     Past Surgical History:  Procedure Laterality Date  . ABDOMINAL HYSTERECTOMY    . APPENDECTOMY    . BREAST BIOPSY    . BREAST EXCISIONAL BIOPSY    . BREAST SURGERY     x 3  . CHOLECYSTECTOMY    . CYSTOSCOPY WITH RETROGRADE PYELOGRAM, URETEROSCOPY AND STENT PLACEMENT Right 12/23/2015   Procedure: CYSTOSCOPY WITH RETROGRADE PYELOGRAM, URETEROSCOPY AND STENT PLACEMENT;  Surgeon: Alexis Frock, MD;  Location: [facility];  Service: Urology;  Laterality: Right;  . DIAGNOSTIC LAPAROSCOPY    . HOLMIUM LASER APPLICATION Right 62/70/3500   Procedure: HOLMIUM LASER APPLICATION;  Surgeon: Alexis Frock, MD;  Location: [facility];  Service: Urology;  Laterality: Right;  . LOOP RECORDER INSERTION N/A  11/02/2017   Procedure: LOOP RECORDER INSERTION;  Surgeon: Thompson Grayer, MD;  Location: [place] CV LAB;  Service: Cardiovascular;  Laterality: N/A;  . salpingectomy with oophorectomy    . TEE WITHOUT CARDIOVERSION N/A 11/02/2017   Procedure: TRANSESOPHAGEAL ECHOCARDIOGRAM (TEE);  Surgeon: Fay Records, MD;  Location: [facility] ENDOSCOPY;  Service: Cardiovascular;  Laterality: N/A;  loop recorder, bubble study    Vitals:   11/20/17 0750  BP: 125/83  Pulse: 68    Subjective Assessment - 11/20/17 0759    Subjective   doing good     Patient is accompained by:  Family member    Pertinent History  anxiety, arthritis, asthma, DM, hx of kidney stones, sleep apnea, HTN, hx of basal cell adenocarcinoma    Limitations  **check BP, arthritis in thumb, R scapula    Currently in Pain?  No/denies        Checked BP at beginning of session--see above.  Gripper set at level 2 to pick up 1 inch blocks for sustained grip strength, mod-max difficulty/ fatigue, reduced to level 1 after approx 10 block to complete remaining with min-mod difficulty/drops    High range functional reaching in standing to place small pegs in vertical pegboard for incr activity tolerance and coordination with min drops  and no rest breaks with LUE.  Placing grooved pegs in pegboard with min incr time for coordination with LUE.    Removing pegs from red putty for incr strength with LUE.  In standing, floor>overhead with ball with BUEs followed by diagonal patterns for incr balance and activity tolerance x10 each.  Arm bike x5 min level 1 for conditioning (forward only) without rest (last 1-79min with LUE only)               OT Education - 11/20/17 0953    Education Details  Updated theraband HEP to red (horizontal abduction with LUE only)    Person(s) Educated  Patient    Methods  Explanation;Demonstration;Verbal cues;Handout    Comprehension  Verbalized understanding;Returned demonstration;Verbal cues required        OT Short Term Goals - 11/16/17 1507      OT SHORT TERM GOAL #1   Title  Pt will be independent with initial HEP for strength and coordination.--check STGs 11/15/17    Time  4    Period  Weeks    Status  Achieved      OT SHORT TERM GOAL #2   Title  Pt will demo improved strength to be able to retrieve 3lb object from overhead shelf with LUE x5 with good control.    Time  4    Period  Weeks    Status  Achieved   11/16/17     OT SHORT TERM GOAL #3   Title  Pt will improve L grip strength by at least 6lbs for opening containers and lifting tasks.    Baseline  30lbs    Time  4    Period  Weeks    Status  Achieved   11/14/17:  38lbs       OT Long Term Goals - 11/20/17 0823      OT LONG TERM GOAL #1   Title  Pt will be independent with updated HEP.--check LTGs 12/15/17    Time  8    Period  Weeks    Status  New      OT LONG TERM GOAL #2   Title  Pt will demo improved strength to be able to retrieve 4-5lb object from overhead shelf with LUE x5 with good control.    Time  8    Period  Weeks    Status  New      OT LONG TERM GOAL #3   Title  Pt will improve L grip strength by at least 12lbs for opening containers and lifting tasks.    Baseline  30lbs    Time  8    Period  Weeks    Status  New      OT LONG TERM GOAL #4   Title  Pt will improve strength, balance, and activity tolerance return to previous cooking/home maintenance tasks mod I. (including walking dog)    Time  8    Period  Weeks    Status  New            Plan - 11/20/17 0802    Clinical Impression Statement  Pt is progressing slowly  with increasing activity tolerance.  R scapular pain (pre-existing intermittently due to arthritis) is a barrier to bilateral activities.    Occupational performance deficits (Please refer to evaluation for details):  ADL's;IADL's;Leisure;Social Participation    Rehab Potential  Good    OT Frequency  2x / week    OT Duration  8 weeks  OT Treatment/Interventions   Self-care/ADL training;Cryotherapy;Paraffin;Therapeutic exercise;DME and/or AE instruction;Functional Mobility Training;Visual/perceptual remediation/compensation;Manual Therapy;Neuromuscular education;Fluidtherapy;Ultrasound;Moist Heat;Energy conservation;Passive range of motion;Therapeutic activities;Patient/family education    Plan  activity tolerance for IADLs, dynamic reaching, LUE strengthening (be aware of R scapula pain)    Consulted and Agree with Plan of Care  Patient       Patient will benefit from skilled therapeutic intervention in order to improve the following deficits and impairments:  Decreased balance, Decreased endurance, Decreased mobility, Decreased activity tolerance, Decreased coordination, Decreased knowledge of use of DME, Decreased strength, Impaired UE functional use  Visit Diagnosis: Hemiplegia and hemiparesis following cerebral infarction affecting left non-dominant side (HCC)  Other lack of coordination  Muscle weakness (generalized)  Unsteadiness on feet  Other abnormalities of gait and mobility    Problem List Patient Active Problem List   Diagnosis Date Noted  . Anxiety 09/30/2017  . OSA on CPAP 09/30/2017  . Obesity 09/30/2017  . Essential hypertension 09/30/2017  . Diabetes mellitus type 2 in obese ([place]) 09/30/2017  . Dysphagia 09/30/2017  . CVA (cerebral vascular accident) ([place]) 09/30/2017  . Left-sided weakness 09/29/2017  . Abdominal pain, left lower quadrant 05/01/2012    [facility] 11/20/2017, 9:54 AM  [facility] [address] [place], [place], [address] Phone: [phone]   Fax:  [phone]  Name: TRINITIE MCGIRR MRN: [id number] Date of Birth: 22-May-1950   [name], OTR/L [facility] [address] [place], [place]  [address] [phone] phone [phone] 11/20/17 9:54 AM

## 2017-11-22 ENCOUNTER — Ambulatory Visit: Payer: Medicare Other | Admitting: Physical Therapy

## 2017-11-22 ENCOUNTER — Encounter: Payer: Self-pay | Admitting: Physical Therapy

## 2017-11-22 ENCOUNTER — Ambulatory Visit: Payer: Medicare Other

## 2017-11-22 ENCOUNTER — Ambulatory Visit: Payer: Medicare Other | Admitting: Occupational Therapy

## 2017-11-22 DIAGNOSIS — R2681 Unsteadiness on feet: Secondary | ICD-10-CM

## 2017-11-22 DIAGNOSIS — I69354 Hemiplegia and hemiparesis following cerebral infarction affecting left non-dominant side: Secondary | ICD-10-CM

## 2017-11-22 DIAGNOSIS — R2689 Other abnormalities of gait and mobility: Secondary | ICD-10-CM

## 2017-11-22 DIAGNOSIS — M6281 Muscle weakness (generalized): Secondary | ICD-10-CM | POA: Diagnosis not present

## 2017-11-22 DIAGNOSIS — R278 Other lack of coordination: Secondary | ICD-10-CM | POA: Diagnosis not present

## 2017-11-22 NOTE — Therapy (Signed)
[facility] [facility] [address] [place], [place], [address] Phone: [phone]   Fax:  [phone]  Occupational Therapy Treatment  Patient Details  Name: Christine Navarro MRN: [id number] Date of Birth: 12/31/1950 Referring Provider: Mayra Neer, MD - PCP.  Referred to therapy by hospitalist   Encounter Date: 11/22/2017  OT End of Session - 11/22/17 0844    Visit Number  9    Number of Visits  17    Date for OT Re-Evaluation  12/15/17    Authorization Type  Medicare, BCBS    Authorization Time Period  cert. date 10/16/17-01/14/18    Authorization - Visit Number  9    Authorization - Number of Visits  10    OT Start Time  0800    OT Stop Time  0840    OT Time Calculation (min)  40 min    Activity Tolerance  Patient tolerated treatment well    Behavior During Therapy  [facility] for tasks assessed/performed;Anxious       Past Medical History:  Diagnosis Date  . Anxiety   . Arthritis   . Asthma   . Basal cell adenocarcinoma    nose  . Diabetes mellitus without complication ([place])   . History of kidney stones   . Hypertension   . PONV (postoperative nausea and vomiting)   . Sleep apnea    mild per pt-  no CPAP  . Stroke (cerebrum) [facility])     Past Surgical History:  Procedure Laterality Date  . ABDOMINAL HYSTERECTOMY    . APPENDECTOMY    . BREAST BIOPSY    . BREAST EXCISIONAL BIOPSY    . BREAST SURGERY     x 3  . CHOLECYSTECTOMY    . CYSTOSCOPY WITH RETROGRADE PYELOGRAM, URETEROSCOPY AND STENT PLACEMENT Right 12/23/2015   Procedure: CYSTOSCOPY WITH RETROGRADE PYELOGRAM, URETEROSCOPY AND STENT PLACEMENT;  Surgeon: Alexis Frock, MD;  Location: [facility];  Service: Urology;  Laterality: Right;  . DIAGNOSTIC LAPAROSCOPY    . HOLMIUM LASER APPLICATION Right 49/17/9150   Procedure: HOLMIUM LASER APPLICATION;  Surgeon: Alexis Frock, MD;  Location: [facility];  Service: Urology;  Laterality: Right;  . LOOP RECORDER INSERTION N/A  11/02/2017   Procedure: LOOP RECORDER INSERTION;  Surgeon: Thompson Grayer, MD;  Location: [place] [facility];  Service: Cardiovascular;  Laterality: N/A;  . salpingectomy with oophorectomy    . TEE WITHOUT CARDIOVERSION N/A 11/02/2017   Procedure: TRANSESOPHAGEAL ECHOCARDIOGRAM (TEE);  Surgeon: Fay Records, MD;  Location: [facility] ENDOSCOPY;  Service: Cardiovascular;  Laterality: N/A;  loop recorder, bubble study    There were no vitals filed for this visit.  Subjective Assessment - 11/22/17 0807    Pertinent History  anxiety, arthritis, asthma, DM, hx of kidney stones, sleep apnea, HTN, hx of basal cell adenocarcinoma    Limitations  **check BP, arthritis in thumb, R scapula    Patient Stated Goals  be able to drive, walk dog, watch grandchild    Currently in Pain?  Yes    Pain Location  Back   under scapula   Pain Orientation  Right;Mid    Pain Descriptors / Indicators  Aching    Pain Type  Chronic pain    Pain Onset  More than a month ago    Pain Frequency  Intermittent    Aggravating Factors   lying and shifting, rolling    Pain Relieving Factors  tylenol, heat  OT Treatments/Exercises (OP) - 11/22/17 0001      ADLs   Home Maintenance  Pt carrying laundry basket around gym x 2. Pt simulated making bed. Pt sweeping kitchen. All tasks w/ no LOB or rest break. Pt completed approx. 20 min. w/o break    ADL Comments  Discussed fall prevention strategies when bending over to pick up something off the floor      Functional Reaching Activities   Mid Level  Mid to high level reaching LUE w/ 1 lb. weight to place small pegs in pegboard on vertical surface. Pt copying design w/ min cues to follow correctly               OT Short Term Goals - 11/16/17 1507      OT SHORT TERM GOAL #1   Title  Pt will be independent with initial HEP for strength and coordination.--check STGs 11/15/17    Time  4    Period  Weeks    Status  Achieved      OT SHORT TERM GOAL  #2   Title  Pt will demo improved strength to be able to retrieve 3lb object from overhead shelf with LUE x5 with good control.    Time  4    Period  Weeks    Status  Achieved   11/16/17     OT SHORT TERM GOAL #3   Title  Pt will improve L grip strength by at least 6lbs for opening containers and lifting tasks.    Baseline  30lbs    Time  4    Period  Weeks    Status  Achieved   11/14/17:  38lbs       OT Long Term Goals - 11/22/17 0826      OT LONG TERM GOAL #1   Title  Pt will be independent with updated HEP.--check LTGs 12/15/17    Time  8    Period  Weeks    Status  Achieved      OT LONG TERM GOAL #2   Title  Pt will demo improved strength to be able to retrieve 4-5lb object from overhead shelf with LUE x5 with good control.    Time  8    Period  Weeks    Status  New      OT LONG TERM GOAL #3   Title  Pt will improve L grip strength by at least 12lbs for opening containers and lifting tasks.    Baseline  30lbs    Time  8    Period  Weeks    Status  New      OT LONG TERM GOAL #4   Title  Pt will improve strength, balance, and activity tolerance return to previous cooking/home maintenance tasks mod I. (including walking dog)    Time  8    Period  Weeks    Status  On-going   Pt doing all home maintenance tasks except walking dog, but just requires more rest breaks           Plan - 11/22/17 0845    Clinical Impression Statement  Pt approximating remaining LTG's. Pt gradually increasing endurance    Occupational Profile and client history currently impacting functional performance  Pt was independent prior to CVA including driving and walking neighbor's dog.  Pt did most of the cooking and cleaning in the house and watched her 2 y.o. granddaughter.  Pt is currently limited in these activities.  Occupational performance deficits (Please refer to evaluation for details):  ADL's;IADL's;Leisure;Social Participation    Rehab Potential  Good    OT Frequency  2x / week     OT Duration  8 weeks    OT Treatment/Interventions  Self-care/ADL training;Cryotherapy;Paraffin;Therapeutic exercise;DME and/or AE instruction;Functional Mobility Training;Visual/perceptual remediation/compensation;Manual Therapy;Neuromuscular education;Fluidtherapy;Ultrasound;Moist Heat;Energy conservation;Passive range of motion;Therapeutic activities;Patient/family education    Plan  progress note due    OT Home Exercise Plan  Education provided:  coordination/red putty HEP    Recommended Other Services  will request order for speech therapy due to pt/dtr concerns with slurring/stuttering, word finding difficulties (resent 10/23/17)    Consulted and Agree with Plan of Care  Patient       Patient will benefit from skilled therapeutic intervention in order to improve the following deficits and impairments:  Decreased balance, Decreased endurance, Decreased mobility, Decreased activity tolerance, Decreased coordination, Decreased knowledge of use of DME, Decreased strength, Impaired UE functional use  Visit Diagnosis: Hemiplegia and hemiparesis following cerebral infarction affecting left non-dominant side (HCC)  Other lack of coordination  Muscle weakness (generalized)  Unsteadiness on feet    Problem List Patient Active Problem List   Diagnosis Date Noted  . Anxiety 09/30/2017  . OSA on CPAP 09/30/2017  . Obesity 09/30/2017  . Essential hypertension 09/30/2017  . Diabetes mellitus type 2 in obese ([place]) 09/30/2017  . Dysphagia 09/30/2017  . CVA (cerebral vascular accident) ([place]) 09/30/2017  . Left-sided weakness 09/29/2017  . Abdominal pain, left lower quadrant 05/01/2012    Carey Bullocks, OTR/L 11/22/2017, 8:46 AM  [facility] [address] [place], [place], [address] Phone: [phone]   Fax:  [phone]  Name: Christine Navarro MRN: [id number] Date of Birth: 10-08-1950

## 2017-11-23 DIAGNOSIS — R079 Chest pain, unspecified: Secondary | ICD-10-CM | POA: Diagnosis not present

## 2017-11-23 DIAGNOSIS — R0789 Other chest pain: Secondary | ICD-10-CM | POA: Diagnosis not present

## 2017-11-23 DIAGNOSIS — I10 Essential (primary) hypertension: Secondary | ICD-10-CM | POA: Diagnosis not present

## 2017-11-23 NOTE — Therapy (Signed)
[facility] [address] [place], [place], [address] Phone: [phone]   Fax:  [phone]  Physical Therapy Treatment  Patient Details  Name: MARESA MORASH MRN: [id number] Date of Birth: 18-Nov-1950 Referring Provider: Mayra Neer, MD - PCP.  Referred to therapy by hospitalist   Encounter Date: 11/22/2017  PT End of Session - 11/22/17 0851    Visit Number  7    Number of Visits  13    Date for PT Re-Evaluation  11/30/17    Authorization Type  Medicare and BCBS - 10th visit note routed to PCP    PT Start Time  0849    PT Stop Time  0929    PT Time Calculation (min)  40 min    Activity Tolerance  Patient tolerated treatment well    Behavior During Therapy  [facility] for tasks assessed/performed       Past Medical History:  Diagnosis Date  . Anxiety   . Arthritis   . Asthma   . Basal cell adenocarcinoma    nose  . Diabetes mellitus without complication ([place])   . History of kidney stones   . Hypertension   . PONV (postoperative nausea and vomiting)   . Sleep apnea    mild per pt-  no CPAP  . Stroke (cerebrum) [facility])     Past Surgical History:  Procedure Laterality Date  . ABDOMINAL HYSTERECTOMY    . APPENDECTOMY    . BREAST BIOPSY    . BREAST EXCISIONAL BIOPSY    . BREAST SURGERY     x 3  . CHOLECYSTECTOMY    . CYSTOSCOPY WITH RETROGRADE PYELOGRAM, URETEROSCOPY AND STENT PLACEMENT Right 12/23/2015   Procedure: CYSTOSCOPY WITH RETROGRADE PYELOGRAM, URETEROSCOPY AND STENT PLACEMENT;  Surgeon: Alexis Frock, MD;  [facility];  Service: Urology;  Laterality: Right;  . DIAGNOSTIC LAPAROSCOPY    . HOLMIUM LASER APPLICATION Right 26/71/2458   Procedure: HOLMIUM LASER APPLICATION;  Surgeon: Alexis Frock, MD;  [facility];  Service: Urology;  Laterality: Right;  . LOOP RECORDER INSERTION N/A 11/02/2017   Procedure: LOOP RECORDER INSERTION;  Surgeon: Thompson Grayer, MD;  Location: [place] CV LAB;   Service: Cardiovascular;  Laterality: N/A;  . salpingectomy with oophorectomy    . TEE WITHOUT CARDIOVERSION N/A 11/02/2017   Procedure: TRANSESOPHAGEAL ECHOCARDIOGRAM (TEE);  Surgeon: Fay Records, MD;  Location: [facility] ENDOSCOPY;  Service: Cardiovascular;  Laterality: N/A;  loop recorder, bubble study    There were no vitals filed for this visit.  Subjective Assessment - 11/22/17 0847    Subjective  No falls. Did continue to have mid back pain after last session. Still hurting some today when getting up form the bed.     Pertinent History  anxiety, OSA on CPAP, essential HTN, DM and obesity    Patient Stated Goals  To improve strength and balance    Currently in Pain?  Yes    Pain Score  3     Pain Location  Back   mid back/under scapula's   Pain Orientation  Mid;Right    Pain Descriptors / Indicators  Aching    Pain Type  Chronic pain    Pain Onset  More than a month ago    Pain Frequency  Intermittent    Aggravating Factors   increased activity, lying down and rolling    Pain Relieving Factors  tylenol, heat           [facility] Adult PT  Treatment/Exercise - 11/22/17 0851      Transfers   Transfers  Sit to Stand;Stand to Sit    Sit to Stand  6: Modified independent (Device/Increase time);Without upper extremity assist;From bed    Stand to Sit  6: Modified independent (Device/Increase time);Without upper extremity assist;To bed      Ambulation/Gait   Ambulation/Gait  Yes    Ambulation/Gait Assistance  6: Modified independent (Device/Increase time)    Ambulation/Gait Assistance Details  PTA setting faster pace and engaging pt in conversation the entire time with no balance loss noted.     Ambulation Distance (Feet)  1000 Feet   6 minutes   Assistive device  None    Gait Pattern  Within Functional Limits    Ambulation Surface  Level;Unlevel;Indoor;Outdoor;Paved      Exercises   Exercises  Lumbar      Lumbar Exercises: Aerobic   Other Aerobic Exercise  Scifit with mostly  LE's/intermittent UE's: level 1.3 for 4 minutes work, rest for 2 minutes, then 4 additional minutes work. goal >/=45 rpm.           Balance Exercises - 11/22/17 0928      Balance Exercises: Standing   Standing Eyes Closed  Narrow base of support (BOS);Wide (BOA);Head turns;Foam/compliant surface;Other reps (comment);30 secs;Limitations    SLS with Vectors  Foam/compliant surface;Intermittent upper extremity assist;Other reps (comment);Limitations    Balance Beam  standing across blue foam beam: alternating fwd heel taps, then alternating bwd toe taps. No UE support. cues for step lenght/height to clear beam surface. min gaurd to min assist for balance.     Tandem Gait  Forward;Retro;Intermittent upper extremity support;Foam/compliant surface;3 reps;Limitations    Sidestepping  Foam/compliant support;3 reps;Limitations      Balance Exercises: Standing   Standing Eyes Closed Limitations  standing across blue foam beam: with feet together EC no head movements, with feet apart EC head movements left<>right, up<>down. min guard to min assist for balance due to increased postural sway.      SLS with Vectors Limitations  standing across blue foam beam with 2 tall cones in front: alternating fwd toe taps, then alternating cross toe taps. cues on stance position, weight shifting and posture to assist with balance. min assist needed along with stated cues.     Tandem Gait Limitations  on blue foam beam next to counter top: 3 laps each fwd/bwd with mirror feedback to see beam so to promote full upright posture. cues on foot placement and weight shifting with activity.     Sidestepping Limitations  on blue foam beam next to counter top: 3 laps toward each side with intermittent touch to counter for balance. cues on posture, step height and weight shifting.                         PT Short Term Goals - 11/14/17 0807      PT SHORT TERM GOAL #1   Title  Pt will participate in assessment of DGI. (ALL  STGs DUE 11/07/17)    Baseline  11/01/17: DGI performed this date and goals updated with baseline values.     Status  Achieved      PT SHORT TERM GOAL #2   Title  Pt will initiate gait training with cane indoors and outdoors to decrease falls risk    Baseline  11/14/17: met to date    Time  --    Period  --    Status  Achieved      PT SHORT TERM GOAL #3   Title  Pt will improve LE strength as indicated by decrease in five time sit to stand to </= 16 seconds with more normal BOS    Baseline  11/14/17: 11.56 sesc's no UE assist using standard height chair    Time  --    Period  --    Status  Achieved      PT SHORT TERM GOAL #4   Title  Pt will improve BERG score to >/= 48/56 to decrease falls risk    Baseline  11/14/17: 53/56 scored today    Time  --    Period  --    Status  Achieved      PT SHORT TERM GOAL #5   Title  Pt will increase gait velocity to >2.62 ft/sec with use of cane    Baseline  11/14/17: 3.17 ft/sec with hurrycane    Time  --    Period  --    Status  Achieved        PT Long Term Goals - 11/14/17 1030      PT LONG TERM GOAL #1   Title  Pt will be independent with HEP and community wellness (water aerobics?) (ALL LTGs DUE 11/30/17)    Time  6    Period  Weeks    Status  On-going      PT LONG TERM GOAL #2   Title  Pt will improve DGI score by 4 points    Baseline  11/01/17: 19/24 scored today for baseline    Time  6    Period  Weeks    Status  On-going      PT LONG TERM GOAL #3   Title  Pt will improve five time sit to stand to </= 13 seconds without UE use    Baseline  11/14/17: 11.56 sec's no UE support using standard height chair    Status  Achieved      PT LONG TERM GOAL #4   Title  Pt will improve BERG to >/= 53/56 to indicated lower risk for falls    Baseline  11/14/17: 53/56 scored today    Status  Achieved      PT LONG TERM GOAL #5   Title  Pt will improve gait velocity to >/= 3.0 ft/sec with LRAD     Baseline  11/14/17: 3.17 ft/sec with hurrycane     Status  Achieved      PT LONG TERM GOAL #6   Title  Pt will ambulate 1000' over outdoor paved and grassy surfaces MOD I with LRAD to inidicate safe return to dog walking.    Time  6    Period  Weeks    Status  On-going            Plan - 11/22/17 0851    Clinical Impression Statement  Today's skilled session continued to address activity tolerance and balance reactions. No issues reported with session. Pt continues to make steady progress toward goals and should benefit from continued PT to progress toward unmet goals.     Rehab Potential  Good    PT Frequency  2x / week    PT Duration  6 weeks   cert written for 60 days   PT Treatment/Interventions  ADLs/Self Care Home Management;Aquatic Therapy;Electrical Stimulation;DME Instruction;Gait training;Functional mobility training;Therapeutic activities;Therapeutic exercise;Balance training;Neuromuscular re-education;Patient/family education;Vestibular    PT Next Visit Plan  continue to work on dynamic  gait activities, gait with no AD on outdoor surfaces/ramps/curbs, high level balance reactions with emphasis on single leg stance and vision removed    PT Home Exercise Plan  11/01/17: has been walking daily. [employer] HEP issued with Access Code: [email or web address].    Consulted and Agree with Plan of Care  Patient;Family member/caregiver       Patient will benefit from skilled therapeutic intervention in order to improve the following deficits and impairments:  Decreased balance, Dizziness, Difficulty walking, Decreased strength, Impaired sensation  Visit Diagnosis: Hemiplegia and hemiparesis following cerebral infarction affecting left non-dominant side (HCC)  Unsteadiness on feet  Other abnormalities of gait and mobility     Problem List Patient Active Problem List   Diagnosis Date Noted  . Anxiety 09/30/2017  . OSA on CPAP 09/30/2017  . Obesity 09/30/2017  . Essential hypertension 09/30/2017  . Diabetes mellitus type 2 in obese  ([place]) 09/30/2017  . Dysphagia 09/30/2017  . CVA (cerebral vascular accident) ([place]) 09/30/2017  . Left-sided weakness 09/29/2017  . Abdominal pain, left lower quadrant 05/01/2012    Willow Ora, PTA, [facility] [address], [place], [place] [address] [phone] 11/23/17, 10:44 AM   Name: BURGANDY HACKWORTH MRN: [id number] Date of Birth: December 03, 1950

## 2017-11-27 ENCOUNTER — Telehealth: Payer: Self-pay | Admitting: Internal Medicine

## 2017-11-27 ENCOUNTER — Encounter: Payer: Self-pay | Admitting: Occupational Therapy

## 2017-11-27 ENCOUNTER — Encounter: Payer: Self-pay | Admitting: Physical Therapy

## 2017-11-27 ENCOUNTER — Ambulatory Visit: Payer: Medicare Other | Admitting: Physical Therapy

## 2017-11-27 ENCOUNTER — Ambulatory Visit: Payer: Medicare Other | Admitting: Occupational Therapy

## 2017-11-27 DIAGNOSIS — M6281 Muscle weakness (generalized): Secondary | ICD-10-CM

## 2017-11-27 DIAGNOSIS — R2681 Unsteadiness on feet: Secondary | ICD-10-CM

## 2017-11-27 DIAGNOSIS — I69354 Hemiplegia and hemiparesis following cerebral infarction affecting left non-dominant side: Secondary | ICD-10-CM

## 2017-11-27 DIAGNOSIS — R2689 Other abnormalities of gait and mobility: Secondary | ICD-10-CM | POA: Diagnosis not present

## 2017-11-27 DIAGNOSIS — R278 Other lack of coordination: Secondary | ICD-10-CM | POA: Diagnosis not present

## 2017-11-27 NOTE — Therapy (Addendum)
[facility] [address] [place], [place], [address] Phone: [phone]   Fax:  [phone]  Occupational Therapy Treatment  Patient Details  Name: Christine Navarro MRN: [id number] Date of Birth: 1950/12/19 Referring Provider: Mayra Neer, MD - PCP.  Referred to therapy by hospitalist   Encounter Date: 11/27/2017  OT End of Session - 11/27/17 0816    Visit Number  10    Number of Visits  17    Date for OT Re-Evaluation  12/15/17    Authorization Type  Medicare, BCBS    Authorization Time Period  cert. date 10/16/17-01/14/18    Authorization - Visit Number  10    Authorization - Number of Visits  10    OT Start Time  0810    OT Stop Time  [phone]    OT Time Calculation (min)  28 min    Activity Tolerance  Patient tolerated treatment well    Behavior During Therapy  [facility] for tasks assessed/performed;Anxious       Past Medical History:  Diagnosis Date  . Anxiety   . Arthritis   . Asthma   . Basal cell adenocarcinoma    nose  . Diabetes mellitus without complication ([place])   . History of kidney stones   . Hypertension   . PONV (postoperative nausea and vomiting)   . Sleep apnea    mild per pt-  no CPAP  . Stroke (cerebrum) [facility])     Past Surgical History:  Procedure Laterality Date  . ABDOMINAL HYSTERECTOMY    . APPENDECTOMY    . BREAST BIOPSY    . BREAST EXCISIONAL BIOPSY    . BREAST SURGERY     x 3  . CHOLECYSTECTOMY    . CYSTOSCOPY WITH RETROGRADE PYELOGRAM, URETEROSCOPY AND STENT PLACEMENT Right 12/23/2015   Procedure: CYSTOSCOPY WITH RETROGRADE PYELOGRAM, URETEROSCOPY AND STENT PLACEMENT;  Surgeon: Alexis Frock, MD;  Location: [facility];  Service: Urology;  Laterality: Right;  . DIAGNOSTIC LAPAROSCOPY    . HOLMIUM LASER APPLICATION Right 43/32/9518   Procedure: HOLMIUM LASER APPLICATION;  Surgeon: Alexis Frock, MD;  Location: [facility];  Service: Urology;  Laterality: Right;  . LOOP RECORDER INSERTION  N/A 11/02/2017   Procedure: LOOP RECORDER INSERTION;  Surgeon: Thompson Grayer, MD;  Location: [facility] CV LAB;  Service: Cardiovascular;  Laterality: N/A;  . salpingectomy with oophorectomy    . TEE WITHOUT CARDIOVERSION N/A 11/02/2017   Procedure: TRANSESOPHAGEAL ECHOCARDIOGRAM (TEE);  Surgeon: Fay Records, MD;  Location: [facility] ENDOSCOPY;  Service: Cardiovascular;  Laterality: N/A;  loop recorder, bubble study    There were no vitals filed for this visit.  Subjective Assessment - 11/27/17 0814    Subjective   Had to call 911 last Thursday due to chest pain, but probably heart burn as EKG was normal (no ER visit or hospitalization).  Has been driving, but hasn't walked dogs yet.  Has been mopping and cleaning with extra breaks.    Pertinent History  anxiety, arthritis, asthma, DM, hx of kidney stones, sleep apnea, HTN, hx of basal cell adenocarcinoma    Limitations  **check BP, arthritis in thumb, R scapula    Patient Stated Goals  be able to drive, walk dog, watch grandchild    Currently in Pain?  No/denies    Pain Onset  More than a month ago          High range functional reaching in standing to place small pegs in vertical pegboard for  incr activity tolerance and coordination with min drops and no rest breaks with LUE.  Removing pegs from red putty for incr strength with LUE.    In standing, functional reaching to place clothespins with 1-8lb resistance on vertical pole for incr balance for IADLs, incr activity tolerance, and incr coordination/strength (incorporating trunk rotation, wt. Shift to retrieve from various locations outside base of support/on floor).  With no LOB.  Noted incr redness around loop recorder site today.  Pt reports that she has called MD about this and will call again today as she hasn't heard back.                     OT Short Term Goals - 11/16/17 1507      OT SHORT TERM GOAL #1   Title  Pt will be independent with initial HEP for strength  and coordination.--check STGs 11/15/17    Time  4    Period  Weeks    Status  Achieved      OT SHORT TERM GOAL #2   Title  Pt will demo improved strength to be able to retrieve 3lb object from overhead shelf with LUE x5 with good control.    Time  4    Period  Weeks    Status  Achieved   11/16/17     OT SHORT TERM GOAL #3   Title  Pt will improve L grip strength by at least 6lbs for opening containers and lifting tasks.    Baseline  30lbs    Time  4    Period  Weeks    Status  Achieved   11/14/17:  38lbs       OT Long Term Goals - 11/27/17 0824      OT LONG TERM GOAL #1   Title  Pt will be independent with updated HEP.--check LTGs 12/15/17    Time  8    Period  Weeks    Status  Achieved      OT LONG TERM GOAL #2   Title  Pt will demo improved strength to be able to retrieve 4-5lb object from overhead shelf with LUE x5 with good control.    Time  8    Period  Weeks    Status  On-going      OT LONG TERM GOAL #3   Title  Pt will improve L grip strength by at least 12lbs for opening containers and lifting tasks.    Baseline  30lbs    Time  8    Period  Weeks    Status  Achieved   11/27/17:  48lbs     OT LONG TERM GOAL #4   Title  Pt will improve strength, balance, and activity tolerance return to previous cooking/home maintenance tasks mod I. (including walking dog)    Time  8    Period  Weeks    Status  On-going   Pt doing all home maintenance tasks except walking dog, but just requires more rest breaks           Plan - 11/27/17 0817    Clinical Impression Statement  Pt approximating remaining LTG's. Pt gradually increasing endurance/activity tolerance.  Pt has not yet returned to walking dogs.  Pt would benefit from continued occupational therapy to address LUE strength and activity tolerance for all IADLs. (Reporting period 10/16/17-11/27/17)    Occupational Profile and client history currently impacting functional performance  Pt was independent prior to CVA  including driving and walking neighbor's dog.  Pt did most of the cooking and cleaning in the house and watched her 2 y.o. granddaughter.  Pt is currently limited in these activities.    Occupational performance deficits (Please refer to evaluation for details):  ADL's;IADL's;Leisure;Social Participation    Rehab Potential  Good    OT Frequency  2x / week    OT Duration  8 weeks    OT Treatment/Interventions  Self-care/ADL training;Cryotherapy;Paraffin;Therapeutic exercise;DME and/or AE instruction;Functional Mobility Training;Visual/perceptual remediation/compensation;Manual Therapy;Neuromuscular education;Fluidtherapy;Ultrasound;Moist Heat;Energy conservation;Passive range of motion;Therapeutic activities;Patient/family education    Plan  continue to addresss activity tolerance for IADLs; anticipate d/c in next 1-2 sessions (pt to try walking dogs before next session)    OT Home Exercise Plan  Education provided:  coordination/red putty HEP    Recommended Other Services  will request order for speech therapy due to pt/dtr concerns with slurring/stuttering, word finding difficulties (resent 10/23/17)    Consulted and Agree with Plan of Care  Patient       Patient will benefit from skilled therapeutic intervention in order to improve the following deficits and impairments:  Decreased balance, Decreased endurance, Decreased mobility, Decreased activity tolerance, Decreased coordination, Decreased knowledge of use of DME, Decreased strength, Impaired UE functional use  Visit Diagnosis: Hemiplegia and hemiparesis following cerebral infarction affecting left non-dominant side (HCC)  Other lack of coordination  Muscle weakness (generalized)  Unsteadiness on feet  Other abnormalities of gait and mobility    Problem List Patient Active Problem List   Diagnosis Date Noted  . Anxiety 09/30/2017  . OSA on CPAP 09/30/2017  . Obesity 09/30/2017  . Essential hypertension 09/30/2017  . Diabetes  mellitus type 2 in obese ([employer]) 09/30/2017  . Dysphagia 09/30/2017  . CVA (cerebral vascular accident) ([place]) 09/30/2017  . Left-sided weakness 09/29/2017  . Abdominal pain, left lower quadrant 05/01/2012    [facility] 11/27/2017, 2:40 PM  [facility] [address] [place], [place], [address] Phone: [phone]   Fax:  [phone]  Name: LYRIC ROSSANO MRN: [id number] Date of Birth: October 01, 1950   [name], OTR/L [facility] [address] [place], [place]  [address] [phone] phone [phone] 11/27/17 2:40 PM

## 2017-11-27 NOTE — Patient Instructions (Signed)
Access Code: [email or web address]  URL: [email or web address]  Date: 11/27/2017  Prepared by: Misty Stanley   Exercises  Walking Tandem Stance - 3 reps - 1 sets - 1x daily - 5x weekly  Standing Single Leg Stance with Unilateral Counter Support - 3 reps - 1 sets - 10 hold - 1x daily - 5x weekly  Wide Stance with Eyes Closed on Foam Pad - 3 reps - 1 sets - 30 hold - 1x daily - 5x weekly  Wide Stance with Eyes Closed and Head Rotation - 10 reps - 1 sets - 1x daily - 5x weekly  Wide Stance with Eyes Closed and Head Nods - 10 reps - 1 sets - 1x daily - 5x weekly   Single Leg Bridge - 10 reps - 2 sets - 1x daily

## 2017-11-27 NOTE — Telephone Encounter (Signed)
° °  Patient has concerns about loop recorder site being itchy and red. Please call

## 2017-11-27 NOTE — Telephone Encounter (Signed)
Christine Navarro reports that itching and redness comes and goes, this has been happening since implant. She denies fever and chills, she reports that the incision is still closed. Dermabond was used to close the incision, I wonder if this is an allergic reaction, she reports she has very sensitive skin. I advised her to use hydrocortisone cream OTC and if this does not help in 1-2 days to call back and she may need to be seen in office. She verbalizes understanding.  Routed to Dr. Rayann Heman for any additional recommendations.

## 2017-11-28 NOTE — Therapy (Signed)
[facility] [address] [place], [place], [address] Phone: [phone]   Fax:  [phone]  Physical Therapy Treatment  Patient Details  Name: Christine Navarro MRN: [id number] Date of Birth: [birth year] Referring Provider: Mayra Neer, MD - PCP.  Referred to therapy by hospitalist   Encounter Date: 11/27/2017  PT End of Session - 11/28/17 1143    Visit Number  8    Number of Visits  13    Date for PT Re-Evaluation  11/30/17    Authorization Type  Medicare and BCBS - 10th visit note routed to PCP    PT Start Time  0850    PT Stop Time  0933    PT Time Calculation (min)  43 min    Activity Tolerance  Patient tolerated treatment well    Behavior During Therapy  [facility] for tasks assessed/performed       Past Medical History:  Diagnosis Date  . Anxiety   . Arthritis   . Asthma   . Basal cell adenocarcinoma    nose  . Diabetes mellitus without complication ([place])   . History of kidney stones   . Hypertension   . PONV (postoperative nausea and vomiting)   . Sleep apnea    mild per pt-  no CPAP  . Stroke (cerebrum) [facility])     Past Surgical History:  Procedure Laterality Date  . ABDOMINAL HYSTERECTOMY    . APPENDECTOMY    . BREAST BIOPSY    . BREAST EXCISIONAL BIOPSY    . BREAST SURGERY     x 3  . CHOLECYSTECTOMY    . CYSTOSCOPY WITH RETROGRADE PYELOGRAM, URETEROSCOPY AND STENT PLACEMENT Right 12/23/2015   Procedure: CYSTOSCOPY WITH RETROGRADE PYELOGRAM, URETEROSCOPY AND STENT PLACEMENT;  Surgeon: Alexis Frock, MD;  [facility]: [facility];  Service: Urology;  Laterality: Right;  . DIAGNOSTIC LAPAROSCOPY    . HOLMIUM LASER APPLICATION Right 56/31/4970   Procedure: HOLMIUM LASER APPLICATION;  Surgeon: Alexis Frock, MD;  [facility]: [facility];  Service: Urology;  Laterality: Right;  . LOOP RECORDER INSERTION N/A 11/02/2017   Procedure: LOOP RECORDER INSERTION;  Surgeon: Thompson Grayer, MD;  Location: [facility] CV LAB;   Service: Cardiovascular;  Laterality: N/A;  . salpingectomy with oophorectomy    . TEE WITHOUT CARDIOVERSION N/A 11/02/2017   Procedure: TRANSESOPHAGEAL ECHOCARDIOGRAM (TEE);  Surgeon: Fay Records, MD;  Location: [facility] ENDOSCOPY;  Service: Cardiovascular;  Laterality: N/A;  loop recorder, bubble study    There were no vitals filed for this visit.  Subjective Assessment - 11/27/17 0849    Subjective  Still having some mid back pain but after she moves around a little bit, it gets better.  With OT, after she returns to walking the dogs they plan to D/C her.     Pertinent History  anxiety, OSA on CPAP, essential HTN, DM and obesity    Patient Stated Goals  To improve strength and balance    Currently in Pain?  Yes    Pain Score  2     Pain Location  Back    Pain Orientation  Right;Mid    Pain Descriptors / Indicators  Sore    Pain Type  Chronic pain    Pain Onset  More than a month ago         [facility] PT Assessment - 11/27/17 0907      Standardized Balance Assessment   Standardized Balance Assessment  Berg Balance Test;10 meter walk test  10 Meter Walk  9.5 seconds or 3.42 ft/sec      Berg Balance Test   Sit to Stand  Able to stand without using hands and stabilize independently    Standing Unsupported  Able to stand safely 2 minutes    Sitting with Back Unsupported but Feet Supported on Floor or Stool  Able to sit safely and securely 2 minutes    Stand to Sit  Sits safely with minimal use of hands    Transfers  Able to transfer safely, minor use of hands    Standing Unsupported with Eyes Closed  Able to stand 10 seconds safely    Standing Ubsupported with Feet Together  Able to place feet together independently and stand 1 minute safely    From Standing, Reach Forward with Outstretched Arm  Can reach confidently >25 cm (10")    From Standing Position, Pick up Object from Floor  Able to pick up shoe safely and easily    From Standing Position, Turn to Look Behind Over each Shoulder   Looks behind from both sides and weight shifts well    Turn 360 Degrees  Able to turn 360 degrees safely in 4 seconds or less    Standing Unsupported, Alternately Place Feet on Step/Stool  Able to stand independently and safely and complete 8 steps in 20 seconds    Standing Unsupported, One Foot in Front  Able to plae foot ahead of the other independently and hold 30 seconds    Standing on One Leg  Able to lift leg independently and hold > 10 seconds    Total Score  55    Berg comment:  55/56      Dynamic Gait Index   Level Surface  Normal    Change in Gait Speed  Normal    Gait with Horizontal Head Turns  Normal    Gait with Vertical Head Turns  Normal    Gait and Pivot Turn  Normal    Step Over Obstacle  Normal    Step Around Obstacles  Normal    Steps  Normal   must do step to when descending due to knee   Total Score  24      Initiated review of HEP below.  Attempted to progress sit to stand to compliant surface with mainly LLE stance (staggered stance) and then with feet together for narrow BOS but pt unable to tolerate due to knee OA pain.  Removed sit <> stand and added single leg bridge which pt was able to tolerate.   Access Code: [email or web address]  URL: [email or web address]  Date: 11/27/2017  Prepared by: Misty Stanley   Exercises  Walking Tandem Stance - 3 reps - 1 sets - 1x daily - 5x weekly  Standing Single Leg Stance with Unilateral Counter Support - 3 reps - 1 sets - 10 hold - 1x daily - 5x weekly  Wide Stance with Eyes Closed on Foam Pad - 3 reps - 1 sets - 30 hold - 1x daily - 5x weekly  Wide Stance with Eyes Closed and Head Rotation - 10 reps - 1 sets - 1x daily - 5x weekly  Wide Stance with Eyes Closed and Head Nods - 10 reps - 1 sets - 1x daily - 5x weekly   Removed sit<>stand exercise and replaced with: Single Leg Bridge - 10 reps - 2 sets - 1x daily  PT Education - 11/28/17 1142    Education Details  progress  towards goals; plan for one more session to review HEP and then D/C    Person(s) Educated  Patient    Methods  Explanation    Comprehension  Verbalized understanding       PT Short Term Goals - 11/14/17 0807      PT SHORT TERM GOAL #1   Title  Pt will participate in assessment of DGI. (ALL STGs DUE 11/07/17)    Baseline  11/01/17: DGI performed this date and goals updated with baseline values.     Status  Achieved      PT SHORT TERM GOAL #2   Title  Pt will initiate gait training with cane indoors and outdoors to decrease falls risk    Baseline  11/14/17: met to date    Time  --    Period  --    Status  Achieved      PT SHORT TERM GOAL #3   Title  Pt will improve LE strength as indicated by decrease in five time sit to stand to </= 16 seconds with more normal BOS    Baseline  11/14/17: 11.56 sesc's no UE assist using standard height chair    Time  --    Period  --    Status  Achieved      PT SHORT TERM GOAL #4   Title  Pt will improve BERG score to >/= 48/56 to decrease falls risk    Baseline  11/14/17: 53/56 scored today    Time  --    Period  --    Status  Achieved      PT SHORT TERM GOAL #5   Title  Pt will increase gait velocity to >2.62 ft/sec with use of cane    Baseline  11/14/17: 3.17 ft/sec with hurrycane    Time  --    Period  --    Status  Achieved        PT Long Term Goals - 11/27/17 0900      PT LONG TERM GOAL #1   Title  Pt will be independent with HEP and community wellness (water aerobics?) (ALL LTGs DUE 11/30/17)    Time  6    Period  Weeks    Status  On-going      PT LONG TERM GOAL #2   Title  Pt will improve DGI score by 4 points    Baseline  24/24 improved from 19/24    Time  6    Period  Weeks    Status  Achieved      PT LONG TERM GOAL #3   Title  Pt will improve five time sit to stand to </= 13 seconds without UE use    Baseline  11/14/17: 11.56 sec's no UE support using standard height chair    Status  Achieved      PT LONG TERM GOAL #4    Title  Pt will improve BERG to >/= 53/56 to indicated lower risk for falls    Baseline  55/56    Status  Achieved      PT LONG TERM GOAL #5   Title  Pt will improve gait velocity to >/= 3.0 ft/sec with LRAD     Baseline  3.42 ft/sec    Status  Achieved      PT LONG TERM GOAL #6   Title  Pt will ambulate 1000' over outdoor paved and  grassy surfaces MOD I with LRAD to inidicate safe return to dog walking.    Time  6    Period  Weeks    Status  Achieved            Plan - 11/28/17 1143    Clinical Impression Statement  Treatment session today focused on assessment of progress towards LTG with reassessment of standing balance, dynamic gait, and falls risk.  Pt is making good progress and has met LTG #2-#6 with LTG #1 to be completed at next visit.  Due to progress and pt reporting that she feels close to her baseline, plan to see pt for one more session to finalize and ensure independence with HEP and then D/C.   Pt in agreement with plan.    Rehab Potential  Good    PT Frequency  2x / week    PT Duration  6 weeks   cert written for 60 days   PT Treatment/Interventions  ADLs/Self Care Home Management;Aquatic Therapy;Electrical Stimulation;DME Instruction;Gait training;Functional mobility training;Therapeutic activities;Therapeutic exercise;Balance training;Neuromuscular re-education;Patient/family education;Vestibular    PT Next Visit Plan  finalize HEP: walking, [place] below.  D/C    PT Home Exercise Plan  Access Code: [email or web address].    Consulted and Agree with Plan of Care  Patient       Patient will benefit from skilled therapeutic intervention in order to improve the following deficits and impairments:  Decreased balance, Dizziness, Difficulty walking, Decreased strength, Impaired sensation  Visit Diagnosis: Hemiplegia and hemiparesis following cerebral infarction affecting left non-dominant side (HCC)  Muscle weakness (generalized)  Unsteadiness on feet  Other abnormalities  of gait and mobility     Problem List Patient Active Problem List   Diagnosis Date Noted  . Anxiety 09/30/2017  . OSA on CPAP 09/30/2017  . Obesity 09/30/2017  . Essential hypertension 09/30/2017  . Diabetes mellitus type 2 in obese ([place]) 09/30/2017  . Dysphagia 09/30/2017  . CVA (cerebral vascular accident) ([place]) 09/30/2017  . Left-sided weakness 09/29/2017  . Abdominal pain, left lower quadrant 05/01/2012   Rico Junker, PT, DPT 11/28/17    11:50 AM    [facility] [address] [place], [place], [address] Phone: [phone]   Fax:  [phone]  Name: Christine Navarro MRN: [id number] Date of Birth: [birth year]

## 2017-11-29 ENCOUNTER — Encounter: Payer: Medicare Other | Admitting: Occupational Therapy

## 2017-11-29 ENCOUNTER — Telehealth: Payer: Self-pay | Admitting: Cardiology

## 2017-11-29 ENCOUNTER — Ambulatory Visit: Payer: Medicare Other | Admitting: *Deleted

## 2017-11-29 NOTE — Telephone Encounter (Signed)
Patient called back and stated that the site where her loop recorder is no better. After consulting w/ Device Tech RN. I offered patient an appt for Thursday 11-30-2017 at 4:30 PM. Patient agreed to this.

## 2017-11-30 ENCOUNTER — Ambulatory Visit (INDEPENDENT_AMBULATORY_CARE_PROVIDER_SITE_OTHER): Payer: Medicare Other | Admitting: Internal Medicine

## 2017-11-30 ENCOUNTER — Encounter: Payer: Self-pay | Admitting: Internal Medicine

## 2017-11-30 VITALS — BP 188/94 | HR 99 | Ht 62.0 in | Wt 191.4 lb

## 2017-11-30 DIAGNOSIS — I10 Essential (primary) hypertension: Secondary | ICD-10-CM | POA: Diagnosis not present

## 2017-11-30 DIAGNOSIS — I6389 Other cerebral infarction: Secondary | ICD-10-CM | POA: Diagnosis not present

## 2017-11-30 DIAGNOSIS — I639 Cerebral infarction, unspecified: Secondary | ICD-10-CM

## 2017-11-30 MED ORDER — CEPHALEXIN 500 MG PO CAPS: 500.0000 mg | ORAL_CAPSULE | Freq: Two times a day (BID) | ORAL | 0 refills | Status: AC

## 2017-11-30 NOTE — Patient Instructions (Addendum)
Medication Instructions:  Your physician has recommended you make the following change in your medication:  1.  Take Keflex (cephalexin) 500 mg one capsule by mouth twice a day x 7 days.  Labwork: None ordered.  Testing/Procedures: None ordered.  Follow-Up: You will follow up with the device clinic December 04, 2017 at 1:00 pm.   Implantable Loop Recorder Placement, Care After Refer to this sheet in the next few weeks. These instructions provide you with information about caring for yourself after your procedure. Your health care provider may also give you more specific instructions. Your treatment has been planned according to current medical practices, but problems sometimes occur. Call your health care provider if you have any problems or questions after your procedure. What can I expect after the procedure? After the procedure, it is common to have:  Soreness or pain near the cut from surgery (incision).  Some swelling or bruising near the incision.  Follow these instructions at home:  Medicines  Take over-the-counter and prescription medicines only as told by your health care provider.  If you were prescribed an antibiotic medicine, take it as told by your health care provider. Do not stop taking the antibiotic even if you start to feel better.  Bathing Do not take baths, swim, or use a hot tub until your health care provider approves. You may shower 24 hours after removal of your monitor.  Incision care  Follow instructions from your health care provider about how to take care of your incision. Make sure you: ? Remove your top dressing after 24 hours (before you shower) ? Leave stitches (sutures), skin glue, or adhesive strips in place. These skin closures may need to stay in place for 2 weeks or longer. If adhesive strip edges start to loosen and curl up, you may trim the loose edges. Do not remove adhesive strips completely unless your health care provider tells you to  do that.  Check your incision area every day for signs of infection. Check for: ? More redness, swelling, or pain. ? Fluid or blood. ? Warmth. ? Pus or a bad smell.  Contact a health care provider if:  You have more redness, swelling, or pain around your incision.  You have more fluid or blood coming from your incision.  Your incision feels warm to the touch.  You have pus or a bad smell coming from your incision.  You have a fever.  You have pain that is not relieved by your pain medicine.  You have triggered your device because of fainting (syncope) or because of a heartbeat that feels like it is racing, slow, fluttering, or skipping (palpitations).

## 2017-11-30 NOTE — Progress Notes (Signed)
PCP: Mayra Neer, MD   Primary EP: Dr Freddie Apley is a 67 y.o. female who presents today for electrophysiology followup.  Her ILR site is very itchy, red, and indurated. She thinks that she may have an allergy to the device. Today, she denies symptoms of palpitations, chest pain, shortness of breath,  lower extremity edema, dizziness, presyncope, or syncope.  The patient is otherwise without complaint today.   Past Medical History:  Diagnosis Date  . Anxiety   . Arthritis   . Asthma   . Basal cell adenocarcinoma    nose  . Diabetes mellitus without complication ([place])   . History of kidney stones   . Hypertension   . PONV (postoperative nausea and vomiting)   . Sleep apnea    mild per pt-  no CPAP  . Stroke (cerebrum) [facility]    Past Surgical History:  Procedure Laterality Date  . ABDOMINAL HYSTERECTOMY    . APPENDECTOMY    . BREAST BIOPSY    . BREAST EXCISIONAL BIOPSY    . BREAST SURGERY     x 3  . CHOLECYSTECTOMY    . CYSTOSCOPY WITH RETROGRADE PYELOGRAM, URETEROSCOPY AND STENT PLACEMENT Right 12/23/2015   Procedure: CYSTOSCOPY WITH RETROGRADE PYELOGRAM, URETEROSCOPY AND STENT PLACEMENT;  Surgeon: Alexis Frock, MD;  [facility];  Service: Urology;  Laterality: Right;  . DIAGNOSTIC LAPAROSCOPY    . HOLMIUM LASER APPLICATION Right 10/62/6948   Procedure: HOLMIUM LASER APPLICATION;  Surgeon: Alexis Frock, MD;  [facility];  Service: Urology;  Laterality: Right;  . LOOP RECORDER INSERTION N/A 11/02/2017   Procedure: LOOP RECORDER INSERTION;  Surgeon: Thompson Grayer, MD;  Location: [place] CV LAB;  Service: Cardiovascular;  Laterality: N/A;  . salpingectomy with oophorectomy    . TEE WITHOUT CARDIOVERSION N/A 11/02/2017   Procedure: TRANSESOPHAGEAL ECHOCARDIOGRAM (TEE);  Surgeon: Fay Records, MD;  Location: [facility] ENDOSCOPY;  Service: Cardiovascular;  Laterality: N/A;  loop recorder, bubble study    ROS- all systems are reviewed and negatives  except as per HPI above  Current Outpatient Medications  Medication Sig Dispense Refill  . clopidogrel (PLAVIX) 75 MG tablet Take 1 tablet (75 mg total) by mouth daily. 30 tablet 0  . irbesartan (AVAPRO) 300 MG tablet Take 300 mg by mouth daily.    Marland Kitchen LORazepam (ATIVAN) 1 MG tablet Take 0.5 mg by mouth 2 (two) times daily as needed for anxiety or sleep.     . metFORMIN (GLUCOPHAGE) 500 MG tablet Take 500 mg by mouth 2 (two) times daily.    . metoprolol tartrate (LOPRESSOR) 25 MG tablet Take 25 mg by mouth 2 (two) times daily.    Marland Kitchen oxybutynin (DITROPAN-XL) 5 MG 24 hr tablet Take 5 mg by mouth daily.    Marland Kitchen oxyCODONE-acetaminophen (PERCOCET/ROXICET) 5-325 MG tablet Take 1-2 tablets by mouth every 4 (four) hours as needed for severe pain. 30 tablet 0  . oxymetazoline (AFRIN) 0.05 % nasal spray Place 1 spray into both nostrils daily as needed for congestion.    . ranitidine (ZANTAC) 150 MG tablet Take 150 mg by mouth daily.    . rosuvastatin (CRESTOR) 40 MG tablet Take 40 mg by mouth daily.    Marland Kitchen senna-docusate (SENOKOT-S) 8.6-50 MG tablet Take 1 tablet by mouth 2 (two) times daily. While taking pain meds to prevent constipation. 30 tablet 0  . trolamine salicylate (ASPERCREME) 10 % cream Apply 1 application topically daily as needed for muscle pain.  No current facility-administered medications for this visit.     Physical Exam: Vitals:   11/30/17 1725  BP: (!) 188/94  Pulse: 99  SpO2: 94%  Weight: 191 lb 6.4 oz (86.8 kg)  Height: 5\' 2"  (1.575 m)    GEN- The patient is well appearing, alert and oriented x 3 today.   Head- normocephalic, atraumatic Eyes-  Sclera clear, conjunctiva pink Ears- hearing intact Oropharynx- clear Lungs- Clear to ausculation bilaterally, normal work of breathing Heart- Regular rate and rhythm, no murmurs, rubs or gallops, PMI not laterally displaced GI- soft, NT, ND, + BS Extremities- no clubbing, cyanosis, or edema Skin- site of ILR is very red and  indurated.  No drainage, though there is a blister over the insision site.  Wt Readings from Last 3 Encounters:  11/30/17 191 lb 6.4 oz (86.8 kg)  11/02/17 192 lb 12.7 oz (87.5 kg)  10/30/17 192 lb 12.8 oz (87.5 kg)     Assessment and Plan:  1. Cryptogenic stroke ILR reveals no afib Unfortunately, she has had an allergic reaction to the device.  She finds this to be painful and itchy.  She wishes to have her device removed.  It does not appear to be infected.  She denies fevers or chills.  No drainage from the device. Risks and benefits to ILR removal were discussed at length today.  She understands that risks including but are not limited to bleeding and infection.  Per her request, we will remove the device at this time.  2. HTN Stable No change required today   Thompson Grayer MD, [facility] 11/30/2017 5:40 PM     PROCEDURES:   1. Implantable loop recorder explantation       DESCRIPTION OF PROCEDURE:  Informed written consent was obtained.  The patient required no sedation for the procedure today.   The patients left chest was therefore prepped and draped in the usual sterile fashion.  The skin overlying the ILR monitor was infiltrated with lidocaine for local analgesia.  A 0.5-cm incision was made over the site.  The previously implanted ILR was exposed and removed using a combination of sharp and blunt dissection.  Steri- Strips and a sterile dressing were then applied. EBL<35ml.  There were no early apparent complications.     CONCLUSIONS:   1. Successful explantation of a [employer]onic Reveal LINQ implantable loop recorder   2. No early apparent complications.     Will treat empirically with keflex 500mg  BID x 7 days.  Return in 5-7 days for wound check    Thompson Grayer MD, [facility] 11/30/2017 5:41 PM

## 2017-12-01 ENCOUNTER — Other Ambulatory Visit: Payer: Self-pay | Admitting: Internal Medicine

## 2017-12-04 ENCOUNTER — Encounter: Payer: Medicare Other | Admitting: Speech Pathology

## 2017-12-04 ENCOUNTER — Encounter: Payer: Self-pay | Admitting: Physical Therapy

## 2017-12-04 ENCOUNTER — Ambulatory Visit: Payer: Medicare Other

## 2017-12-04 ENCOUNTER — Ambulatory Visit: Payer: Medicare Other | Admitting: Occupational Therapy

## 2017-12-04 ENCOUNTER — Encounter: Payer: Self-pay | Admitting: Occupational Therapy

## 2017-12-04 ENCOUNTER — Ambulatory Visit: Payer: Medicare Other | Admitting: Physical Therapy

## 2017-12-04 ENCOUNTER — Ambulatory Visit (INDEPENDENT_AMBULATORY_CARE_PROVIDER_SITE_OTHER): Payer: Medicare Other | Admitting: Internal Medicine

## 2017-12-04 VITALS — BP 136/82 | HR 68

## 2017-12-04 DIAGNOSIS — R2689 Other abnormalities of gait and mobility: Secondary | ICD-10-CM | POA: Diagnosis not present

## 2017-12-04 DIAGNOSIS — R2681 Unsteadiness on feet: Secondary | ICD-10-CM

## 2017-12-04 DIAGNOSIS — I69354 Hemiplegia and hemiparesis following cerebral infarction affecting left non-dominant side: Secondary | ICD-10-CM | POA: Diagnosis not present

## 2017-12-04 DIAGNOSIS — M6281 Muscle weakness (generalized): Secondary | ICD-10-CM

## 2017-12-04 DIAGNOSIS — R278 Other lack of coordination: Secondary | ICD-10-CM

## 2017-12-04 DIAGNOSIS — I10 Essential (primary) hypertension: Secondary | ICD-10-CM

## 2017-12-04 DIAGNOSIS — I639 Cerebral infarction, unspecified: Secondary | ICD-10-CM

## 2017-12-04 NOTE — Therapy (Signed)
[facility] [address] [place], [place], [address] Phone: [phone]   Fax:  [phone]  Occupational Therapy Treatment  Patient Details  Name: Christine Navarro MRN: [id number] Date of Birth: 02/22/1951 Referring Provider: Mayra Neer, MD - PCP.  Referred to therapy by hospitalist   Encounter Date: 12/04/2017  OT End of Session - 12/04/17 0805    Visit Number  11    Number of Visits  17    Date for OT Re-Evaluation  12/15/17    Authorization Type  Medicare, BCBS    Authorization Time Period  cert. date 10/16/17-01/14/18    Authorization - Visit Number  11    Authorization - Number of Visits  20    OT Start Time  0750    OT Stop Time  0825    OT Time Calculation (min)  35 min    Activity Tolerance  Patient tolerated treatment well    Behavior During Therapy  [facility] for tasks assessed/performed;Anxious       Past Medical History:  Diagnosis Date  . Anxiety   . Arthritis   . Asthma   . Basal cell adenocarcinoma    nose  . Diabetes mellitus without complication ([place])   . History of kidney stones   . Hypertension   . PONV (postoperative nausea and vomiting)   . Sleep apnea    mild per pt-  no CPAP  . Stroke (cerebrum) [facility])     Past Surgical History:  Procedure Laterality Date  . ABDOMINAL HYSTERECTOMY    . APPENDECTOMY    . BREAST BIOPSY    . BREAST EXCISIONAL BIOPSY    . BREAST SURGERY     x 3  . CHOLECYSTECTOMY    . CYSTOSCOPY WITH RETROGRADE PYELOGRAM, URETEROSCOPY AND STENT PLACEMENT Right 12/23/2015   Procedure: CYSTOSCOPY WITH RETROGRADE PYELOGRAM, URETEROSCOPY AND STENT PLACEMENT;  Surgeon: Alexis Frock, MD;  [facility];  Service: Urology;  Laterality: Right;  . DIAGNOSTIC LAPAROSCOPY    . HOLMIUM LASER APPLICATION Right 03/22/3233   Procedure: HOLMIUM LASER APPLICATION;  Surgeon: Alexis Frock, MD;  [facility];  Service: Urology;  Laterality: Right;  . LOOP RECORDER INSERTION  N/A 11/02/2017   Procedure: LOOP RECORDER INSERTION;  Surgeon: Thompson Grayer, MD;  Location: [place] CV LAB;  Service: Cardiovascular;  Laterality: N/A;  . salpingectomy with oophorectomy    . TEE WITHOUT CARDIOVERSION N/A 11/02/2017   Procedure: TRANSESOPHAGEAL ECHOCARDIOGRAM (TEE);  Surgeon: Fay Records, MD;  Location: [facility] ENDOSCOPY;  Service: Cardiovascular;  Laterality: N/A;  loop recorder, bubble study    Vitals:   12/04/17 0756 12/04/17 0824  BP: (!) 143/90 136/82  Pulse:  68    Subjective Assessment - 12/04/17 0753    Subjective   Pt reports that they had to remove loop recorder Thursday due to infection.  Pt reports that she has walked dogs 3x without difficulty.    Pertinent History  anxiety, arthritis, asthma, DM, hx of kidney stones, sleep apnea, HTN, hx of basal cell adenocarcinoma    Limitations  **check BP, arthritis in thumb, R scapula    Patient Stated Goals  be able to drive, walk dog, watch grandchild    Currently in Pain?  Yes    Pain Location  Chest    Pain Orientation  Left    Pain Descriptors / Indicators  Sore    Pain Type  Acute pain    Pain Onset  More than a month  ago    Pain Frequency  Intermittent    Aggravating Factors   raising LUE a lot     Pain Relieving Factors  rest         Placing O'[place]nor pegs in pegboard  With tweezers with min difficulty/incr time.  Checked goals and discussed progress.  Pt agrees with d/c.    Mid range functional reaching in standing to place small pegs in vertical pegboard for incr activity tolerance and coordination with min drops and no rest breaks with LUE. (lower range due to chest discomfort)  Removing pegs from red putty for incr strength with LUE.        OT Short Term Goals - 11/16/17 1507      OT SHORT TERM GOAL #1   Title  Pt will be independent with initial HEP for strength and coordination.--check STGs 11/15/17    Time  4    Period  Weeks    Status  Achieved      OT SHORT TERM GOAL #2   Title   Pt will demo improved strength to be able to retrieve 3lb object from overhead shelf with LUE x5 with good control.    Time  4    Period  Weeks    Status  Achieved   11/16/17     OT SHORT TERM GOAL #3   Title  Pt will improve L grip strength by at least 6lbs for opening containers and lifting tasks.    Baseline  30lbs    Time  4    Period  Weeks    Status  Achieved   11/14/17:  38lbs       OT Long Term Goals - 12/04/17 0759      OT LONG TERM GOAL #1   Title  Pt will be independent with updated HEP.--check LTGs 12/15/17    Time  8    Period  Weeks    Status  Achieved      OT LONG TERM GOAL #2   Title  Pt will demo improved strength to be able to retrieve 4-5lb object from overhead shelf with LUE x5 with good control.    Time  8    Period  Weeks    Status  Achieved      OT LONG TERM GOAL #3   Title  Pt will improve L grip strength by at least 12lbs for opening containers and lifting tasks.    Baseline  30lbs    Time  8    Period  Weeks    Status  Achieved   11/27/17:  48lbs     OT LONG TERM GOAL #4   Title  Pt will improve strength, balance, and activity tolerance return to previous cooking/home maintenance tasks mod I. (including walking dog)    Time  8    Period  Weeks    Status  Achieved   Pt doing all home maintenance tasks except walking dog, but just requires more rest breaks.  12/04/17  met           Plan - 12/04/17 0759    Clinical Impression Statement  Pt has made excellent progress and has resumed all previous IADLs.  Pt is appropriate for d/c at this time.     Occupational Profile and client history currently impacting functional performance  Pt was independent prior to CVA including driving and walking neighbor's dog.  Pt did most of the cooking and cleaning in the house and watched her  2 y.o. granddaughter.  Pt is currently limited in these activities.    Occupational performance deficits (Please refer to evaluation for details):   ADL's;IADL's;Leisure;Social Participation    Rehab Potential  Good    OT Frequency  2x / week    OT Duration  8 weeks    OT Treatment/Interventions  Self-care/ADL training;Cryotherapy;Paraffin;Therapeutic exercise;DME and/or AE instruction;Functional Mobility Training;Visual/perceptual remediation/compensation;Manual Therapy;Neuromuscular education;Fluidtherapy;Ultrasound;Moist Heat;Energy conservation;Passive range of motion;Therapeutic activities;Patient/family education    Plan  d/c OT    OT Home Exercise Plan  Education provided:  coordination/red putty HEP    Recommended Other Services  will request order for speech therapy due to pt/dtr concerns with slurring/stuttering, word finding difficulties (resent 10/23/17)    Consulted and Agree with Plan of Care  Patient       Patient will benefit from skilled therapeutic intervention in order to improve the following deficits and impairments:  Decreased balance, Decreased endurance, Decreased mobility, Decreased activity tolerance, Decreased coordination, Decreased knowledge of use of DME, Decreased strength, Impaired UE functional use  Visit Diagnosis: Hemiplegia and hemiparesis following cerebral infarction affecting left non-dominant side (HCC)  Other lack of coordination  Muscle weakness (generalized)  Unsteadiness on feet  Other abnormalities of gait and mobility    Problem List Patient Active Problem List   Diagnosis Date Noted  . Anxiety 09/30/2017  . OSA on CPAP 09/30/2017  . Obesity 09/30/2017  . Essential hypertension 09/30/2017  . Diabetes mellitus type 2 in obese ([place]) 09/30/2017  . Dysphagia 09/30/2017  . CVA (cerebral vascular accident) ([place]) 09/30/2017  . Left-sided weakness 09/29/2017  . Abdominal pain, left lower quadrant 05/01/2012    OCCUPATIONAL THERAPY DISCHARGE SUMMARY  Visits from Start of Care: 11  Current functional level related to goals / functional outcomes: See above   Remaining  deficits: Mild decr activity tolerance.  Pt has resumed all previous ADLs/IADLs   Education / Equipment: Pt instructed in HEP.  Pt verbalized understanding.  Plan: Patient agrees to discharge.  Patient goals were met. Patient is being discharged due to meeting the stated rehab goals.  And pt being pleased with current functional level.?????        [facility] 12/04/2017, 8:54 AM  [facility] [address] [place], [place], [address] Phone: [phone]   Fax:  [phone]  Name: Christine Navarro MRN: [id number] Date of Birth: [birth year]   [name], OTR/L [facility] [address] [place], [place]  [address] [phone] phone [phone] 12/04/17 8:54 AM

## 2017-12-04 NOTE — Therapy (Signed)
[facility] [address] [place], [place], [address] Phone: [phone]   Fax:  [phone]  Physical Therapy Treatment and D/C Summary  Patient Details  Name: Christine Navarro MRN: [id number] Date of Birth: 02/18/1951 Referring Provider: Mayra Neer, MD - PCP.  Referred to therapy by hospitalist   Encounter Date: 12/04/2017  PT End of Session - 12/04/17 0933    Visit Number  9    Number of Visits  13    Date for PT Re-Evaluation  11/30/17    Authorization Type  Medicare and BCBS - 10th visit note routed to PCP    PT Start Time  0850    PT Stop Time  0929    PT Time Calculation (min)  39 min    Activity Tolerance  Patient tolerated treatment well    Behavior During Therapy  [facility] for tasks assessed/performed       Past Medical History:  Diagnosis Date  . Anxiety   . Arthritis   . Asthma   . Basal cell adenocarcinoma    nose  . Diabetes mellitus without complication ([place])   . History of kidney stones   . Hypertension   . PONV (postoperative nausea and vomiting)   . Sleep apnea    mild per pt-  no CPAP  . Stroke (cerebrum) [facility])     Past Surgical History:  Procedure Laterality Date  . ABDOMINAL HYSTERECTOMY    . APPENDECTOMY    . BREAST BIOPSY    . BREAST EXCISIONAL BIOPSY    . BREAST SURGERY     x 3  . CHOLECYSTECTOMY    . CYSTOSCOPY WITH RETROGRADE PYELOGRAM, URETEROSCOPY AND STENT PLACEMENT Right 12/23/2015   Procedure: CYSTOSCOPY WITH RETROGRADE PYELOGRAM, URETEROSCOPY AND STENT PLACEMENT;  Surgeon: Alexis Frock, MD;  [facility];  Service: Urology;  Laterality: Right;  . DIAGNOSTIC LAPAROSCOPY    . HOLMIUM LASER APPLICATION Right 48/18/5631   Procedure: HOLMIUM LASER APPLICATION;  Surgeon: Alexis Frock, MD;  [facility];  Service: Urology;  Laterality: Right;  . LOOP RECORDER INSERTION N/A 11/02/2017   Procedure: LOOP RECORDER INSERTION;  Surgeon: Thompson Grayer, MD;  Location: [place] CV LAB;  Service: Cardiovascular;  Laterality: N/A;  . salpingectomy with oophorectomy    . TEE WITHOUT CARDIOVERSION N/A 11/02/2017   Procedure: TRANSESOPHAGEAL ECHOCARDIOGRAM (TEE);  Surgeon: Fay Records, MD;  Location: [facility] ENDOSCOPY;  Service: Cardiovascular;  Laterality: N/A;  loop recorder, bubble study    There were no vitals filed for this visit.  Subjective Assessment - 12/04/17 0855    Subjective  Had to have loop recorder removed due to infection and allergic reaction; very sore today.  D/C from OT today.  Walked the dogs twice this weekend; no issues.      Pertinent History  anxiety, OSA on CPAP, essential HTN, DM and obesity    Patient Stated Goals  To improve strength and balance    Currently in Pain?  Yes    Pain Onset  More than a month ago       Access Code: [email or web address]  URL: [email or web address]  Date: 12/04/2017  Prepared by: Misty Stanley   Program Notes  Continue to progress your walking time/distance.  Keep working on walking on uneven, grassy surfaces with your cane.   Exercises  Walking Tandem Stance - 3 reps - 1 sets - 1x daily - 5x weekly  Standing Single Leg Stance with Unilateral Counter Support - 3 reps -  1 sets - 10 hold - 1x daily - 5x weekly  Romberg Stance Eyes Closed on Foam Pad - 10 reps - 2 sets - 1x daily - 5x weekly  Single Leg Bridge - 10 reps - 2 sets - 1x daily       PT Education - 12/04/17 0930    Education Details  final HEP, D/C today    Person(s) Educated  Patient    Methods  Explanation;Demonstration;Handout    Comprehension  Verbalized understanding;Returned demonstration       PT Short Term Goals - 11/14/17 0807      PT SHORT TERM GOAL #1   Title  Pt will participate in assessment of DGI. (ALL STGs DUE 11/07/17)    Baseline  11/01/17: DGI performed this date and goals updated with baseline values.     Status  Achieved      PT SHORT TERM GOAL #2   Title  Pt will initiate gait training with cane indoors  and outdoors to decrease falls risk    Baseline  11/14/17: met to date    Time  --    Period  --    Status  Achieved      PT SHORT TERM GOAL #3   Title  Pt will improve LE strength as indicated by decrease in five time sit to stand to </= 16 seconds with more normal BOS    Baseline  11/14/17: 11.56 sesc's no UE assist using standard height chair    Time  --    Period  --    Status  Achieved      PT SHORT TERM GOAL #4   Title  Pt will improve BERG score to >/= 48/56 to decrease falls risk    Baseline  11/14/17: 53/56 scored today    Time  --    Period  --    Status  Achieved      PT SHORT TERM GOAL #5   Title  Pt will increase gait velocity to >2.62 ft/sec with use of cane    Baseline  11/14/17: 3.17 ft/sec with hurrycane    Time  --    Period  --    Status  Achieved        PT Long Term Goals - 12/04/17 0930      PT LONG TERM GOAL #1   Title  Pt will be independent with HEP and community wellness (water aerobics?) (ALL LTGs DUE 11/30/17)    Time  6    Period  Weeks    Status  Achieved      PT LONG TERM GOAL #2   Title  Pt will improve DGI score by 4 points    Baseline  24/24 improved from 19/24    Time  6    Period  Weeks    Status  Achieved      PT LONG TERM GOAL #3   Title  Pt will improve five time sit to stand to </= 13 seconds without UE use    Baseline  11/14/17: 11.56 sec's no UE support using standard height chair    Status  Achieved      PT LONG TERM GOAL #4   Title  Pt will improve BERG to >/= 53/56 to indicated lower risk for falls    Baseline  55/56    Status  Achieved      PT LONG TERM GOAL #5   Title  Pt will improve gait velocity to >/=  3.0 ft/sec with LRAD     Baseline  3.42 ft/sec    Status  Achieved      PT LONG TERM GOAL #6   Title  Pt will ambulate 1000' over outdoor paved and grassy surfaces MOD I with LRAD to inidicate safe return to dog walking.    Time  6    Period  Weeks    Status  Achieved            Plan - 12/04/17 0934     Clinical Impression Statement  Completed assessment of LTG today with review and progression of final HEP for D/C.  Pt has made excellent progress and has met all LTG.  Pt demonstrates decreased falls risk with improved standing balance and safety during gait and has progressed to being able to ambulate without use of cane indoors and outdoors in community.  Pt is appropriate for D/C from therapy at this time.      Rehab Potential  Good    PT Duration  --   cert written for 60 days   PT Treatment/Interventions  ADLs/Self Care Home Management;Aquatic Therapy;Electrical Stimulation;DME Instruction;Gait training;Functional mobility training;Therapeutic activities;Therapeutic exercise;Balance training;Neuromuscular re-education;Patient/family education;Vestibular    PT Home Exercise Plan  Access Code: [email or web address].    Consulted and Agree with Plan of Care  Patient       Patient will benefit from skilled therapeutic intervention in order to improve the following deficits and impairments:  Decreased balance, Dizziness, Difficulty walking, Decreased strength, Impaired sensation  Visit Diagnosis: Hemiplegia and hemiparesis following cerebral infarction affecting left non-dominant side (HCC)  Muscle weakness (generalized)  Unsteadiness on feet  Other abnormalities of gait and mobility     Problem List Patient Active Problem List   Diagnosis Date Noted  . Anxiety 09/30/2017  . OSA on CPAP 09/30/2017  . Obesity 09/30/2017  . Essential hypertension 09/30/2017  . Diabetes mellitus type 2 in obese ([place]) 09/30/2017  . Dysphagia 09/30/2017  . CVA (cerebral vascular accident) ([place]) 09/30/2017  . Left-sided weakness 09/29/2017  . Abdominal pain, left lower quadrant 05/01/2012   PHYSICAL THERAPY DISCHARGE SUMMARY  Visits from Start of Care: 9  Current functional level related to goals / functional outcomes: See impression statement and LTG achievement above.   Remaining deficits: Impaired  balance   Education / Equipment: HEP  Plan: Patient agrees to discharge.  Patient goals were met. Patient is being discharged due to meeting the stated rehab goals.  ?????     Rico Junker, PT, DPT 12/04/17    9:38 AM    [facility] [address] [place], [place], [address] Phone: [phone]   Fax:  [phone]  Name: Christine Navarro MRN: [id number] Date of Birth: Jul 26, 1950

## 2017-12-04 NOTE — Patient Instructions (Signed)
Access Code: [email or web address]  URL: [email or web address]  Date: 12/04/2017  Prepared by: Misty Stanley   Program Notes  Continue to progress your walking time/distance.  Keep working on walking on uneven, grassy surfaces with your cane.   Exercises  Walking Tandem Stance - 3 reps - 1 sets - 1x daily - 5x weekly  Standing Single Leg Stance with Unilateral Counter Support - 3 reps - 1 sets - 10 hold - 1x daily - 5x weekly  Romberg Stance Eyes Closed on Foam Pad - 10 reps - 2 sets - 1x daily - 5x weekly  Single Leg Bridge - 10 reps - 2 sets - 1x daily

## 2017-12-04 NOTE — Progress Notes (Signed)
Single steri-strip removed, incision unnaproximated and oozing dark red blood. Gauze placed on top of incision, awaiting evaluation by Dr. Rayann Heman.

## 2017-12-06 ENCOUNTER — Encounter: Payer: Medicare Other | Admitting: Occupational Therapy

## 2017-12-06 ENCOUNTER — Ambulatory Visit: Payer: Medicare Other | Admitting: Physical Therapy

## 2017-12-11 ENCOUNTER — Ambulatory Visit: Payer: Medicare Other | Admitting: Physical Therapy

## 2017-12-11 ENCOUNTER — Ambulatory Visit (INDEPENDENT_AMBULATORY_CARE_PROVIDER_SITE_OTHER): Payer: Medicare Other | Admitting: *Deleted

## 2017-12-11 ENCOUNTER — Encounter: Payer: Medicare Other | Admitting: Occupational Therapy

## 2017-12-11 DIAGNOSIS — I639 Cerebral infarction, unspecified: Secondary | ICD-10-CM

## 2017-12-13 ENCOUNTER — Ambulatory Visit: Payer: Medicare Other | Admitting: Physical Therapy

## 2017-12-13 ENCOUNTER — Encounter: Payer: Medicare Other | Admitting: Occupational Therapy

## 2017-12-13 NOTE — Progress Notes (Signed)
Wound recheck s/p ILR explant on 11/30/17. Site healing well, mild redness noted just caudal of the incision, in the shape of the ILR. No swelling or edema noted. Incision edges are mostly approximated. Dr.Allred assessed wound, and recommended that patient use benadryl ointment or hydrocortisone ointment in the reddened area PRN for itching. Dr.Allred instructed patient to avoid using the ointments directly overtop of the incision. Dr.Allred told patient that she may use neosporin ointment over the incision PRN. Dr.Allred stated that patient can f/u w/him PRN. Instructed patient to call for any increased redness, drainage, edema, fever/chills. Patient verbalized understanding.

## 2017-12-18 DIAGNOSIS — Z6834 Body mass index (BMI) 34.0-34.9, adult: Secondary | ICD-10-CM | POA: Diagnosis not present

## 2017-12-18 DIAGNOSIS — E669 Obesity, unspecified: Secondary | ICD-10-CM | POA: Diagnosis not present

## 2017-12-18 DIAGNOSIS — Z23 Encounter for immunization: Secondary | ICD-10-CM | POA: Diagnosis not present

## 2017-12-18 DIAGNOSIS — I10 Essential (primary) hypertension: Secondary | ICD-10-CM | POA: Diagnosis not present

## 2017-12-29 ENCOUNTER — Telehealth: Payer: Self-pay | Admitting: Internal Medicine

## 2017-12-29 NOTE — Telephone Encounter (Signed)
Dr.Allred recommended that patient f/u on 10/21 for a wound recheck.  Called patient and offered her an appt for 10/21 @ 1130. Patient agreeable to appt. Instructed patient to proceed to ER for any fever/chills over weekend. Patient verbalized understanding.

## 2017-12-29 NOTE — Telephone Encounter (Signed)
Called patient about the drainage from her ILR explant site. Patient states that the site opened back up last weekend and she's noticed occasional "pale pink" drainage coming from the site. Patient states that the site also burns when the water hits it in the shower. Patient denies any fever/chills.   Will inform Dr.Allred about patient's site and notify her with any recommendations. Patient verbalized understanding.

## 2017-12-29 NOTE — Telephone Encounter (Signed)
° ° ° °  Patient calling to report drainage from loop recorder removal site. Patient reports pale pink discharge, soreness  1. Has your device fired? NO  2. Is you device beeping? NO  3. Are you experiencing draining or swelling at device site? YES  4. Are you calling to see if we received your device transmission? NO  5. Have you passed out? NO    Please route to [place]

## 2018-01-01 ENCOUNTER — Ambulatory Visit (INDEPENDENT_AMBULATORY_CARE_PROVIDER_SITE_OTHER): Payer: Self-pay | Admitting: *Deleted

## 2018-01-01 DIAGNOSIS — I639 Cerebral infarction, unspecified: Secondary | ICD-10-CM

## 2018-01-02 NOTE — Progress Notes (Signed)
Patient presents to the office for a wound recheck s/p ILR explant on 9/19. Patient c/o "pale pink" drainage from explant site. Patient denies any fever/chills. Site assessed, no active drainage or bleeding present. Incision edges are mostly approximated without redness or edema. Site assessed by Dr.Allred, recommendation was for patient to apply 2x2 gauze and paper tape for any additional drainage (supplies given) and to f/u with him in 4 weeks. Instructed patient to call for fever/chills. Patient verbalized understanding.

## 2018-01-10 ENCOUNTER — Other Ambulatory Visit: Payer: Self-pay | Admitting: Family Medicine

## 2018-01-10 DIAGNOSIS — Z1231 Encounter for screening mammogram for malignant neoplasm of breast: Secondary | ICD-10-CM

## 2018-01-29 ENCOUNTER — Encounter: Payer: Self-pay | Admitting: Internal Medicine

## 2018-01-29 ENCOUNTER — Ambulatory Visit (INDEPENDENT_AMBULATORY_CARE_PROVIDER_SITE_OTHER): Payer: Medicare Other | Admitting: Internal Medicine

## 2018-01-29 VITALS — BP 114/80 | HR 78 | Ht 62.0 in | Wt 185.2 lb

## 2018-01-29 DIAGNOSIS — I639 Cerebral infarction, unspecified: Secondary | ICD-10-CM

## 2018-01-29 DIAGNOSIS — I10 Essential (primary) hypertension: Secondary | ICD-10-CM | POA: Diagnosis not present

## 2018-01-29 NOTE — Patient Instructions (Addendum)
Medication Instructions:  Your physician recommends that you continue on your current medications as directed. Please refer to the Current Medication list given to you today.  Labwork: None ordered.  Testing/Procedures: None ordered.  Follow-Up: Your physician wants you to follow-up in: as needed with Dr. Rayann Heman.     Any Other Special Instructions Will Be Listed Below (If Applicable).  Dr. Rayann Heman advised you to get an Apple watch version 4 or 5.  If you need a refill on your cardiac medications before your next appointment, please call your pharmacy.

## 2018-01-29 NOTE — Progress Notes (Signed)
PCP: Mayra Neer, MD   Primary EP: Dr Freddie Apley is a 67 y.o. female who presents today for routine electrophysiology followup.  She developed allergic reaction to her ILR for which the device was removed.  No afib was seen on the device.  She has since healed.  She continues to have itching at the site.  No drainage.   + rare palpitations  Today, she denies symptoms of chest pain, shortness of breath,  lower extremity edema, dizziness, presyncope, or syncope.  The patient is otherwise without complaint today.   Past Medical History:  Diagnosis Date  . Anxiety   . Arthritis   . Asthma   . Basal cell adenocarcinoma    nose  . Diabetes mellitus without complication ([place])   . History of kidney stones   . Hypertension   . PONV (postoperative nausea and vomiting)   . Sleep apnea    mild per pt-  no CPAP  . Stroke (cerebrum) [facility])    Past Surgical History:  Procedure Laterality Date  . ABDOMINAL HYSTERECTOMY    . APPENDECTOMY    . BREAST BIOPSY    . BREAST EXCISIONAL BIOPSY    . BREAST SURGERY     x 3  . CHOLECYSTECTOMY    . CYSTOSCOPY WITH RETROGRADE PYELOGRAM, URETEROSCOPY AND STENT PLACEMENT Right 12/23/2015   Procedure: CYSTOSCOPY WITH RETROGRADE PYELOGRAM, URETEROSCOPY AND STENT PLACEMENT;  Surgeon: Alexis Frock, MD;  Location: [facility];  Service: Urology;  Laterality: Right;  . DIAGNOSTIC LAPAROSCOPY    . HOLMIUM LASER APPLICATION Right 40/34/7425   Procedure: HOLMIUM LASER APPLICATION;  Surgeon: Alexis Frock, MD;  Location: [facility];  Service: Urology;  Laterality: Right;  . LOOP RECORDER INSERTION N/A 11/02/2017   Procedure: LOOP RECORDER INSERTION;  Surgeon: Thompson Grayer, MD;  Location: [place] CV LAB;  Service: Cardiovascular;  Laterality: N/A;  . salpingectomy with oophorectomy    . TEE WITHOUT CARDIOVERSION N/A 11/02/2017   Procedure: TRANSESOPHAGEAL ECHOCARDIOGRAM (TEE);  Surgeon: Fay Records, MD;  Location: [facility] ENDOSCOPY;  Service:  Cardiovascular;  Laterality: N/A;  loop recorder, bubble study    ROS- all systems are reviewed and negatives except as per HPI above  Current Outpatient Medications  Medication Sig Dispense Refill  . clopidogrel (PLAVIX) 75 MG tablet Take 1 tablet (75 mg total) by mouth daily. 30 tablet 0  . hydrochlorothiazide (HYDRODIURIL) 25 MG tablet Take 1 tablet by mouth as needed for fluid.  0  . irbesartan (AVAPRO) 300 MG tablet Take 300 mg by mouth daily.    Marland Kitchen LORazepam (ATIVAN) 1 MG tablet Take 0.5 mg by mouth 2 (two) times daily as needed for anxiety or sleep.     . metFORMIN (GLUCOPHAGE) 500 MG tablet Take 500 mg by mouth 2 (two) times daily.    . metoprolol tartrate (LOPRESSOR) 25 MG tablet Take 25 mg by mouth 2 (two) times daily.    Marland Kitchen oxybutynin (DITROPAN-XL) 5 MG 24 hr tablet Take 5 mg by mouth daily.    Marland Kitchen oxymetazoline (AFRIN) 0.05 % nasal spray Place 1 spray into both nostrils daily as needed for congestion.    . ranitidine (ZANTAC) 150 MG tablet Take 150 mg by mouth daily.    . rosuvastatin (CRESTOR) 40 MG tablet Take 40 mg by mouth daily.    Marland Kitchen senna-docusate (SENOKOT-S) 8.6-50 MG tablet Take 1 tablet by mouth 2 (two) times daily. While taking pain meds to prevent constipation. 30 tablet 0  . trolamine  salicylate (ASPERCREME) 10 % cream Apply 1 application topically daily as needed for muscle pain.     No current facility-administered medications for this visit.     Physical Exam: Vitals:   01/29/18 1119  BP: 114/80  Pulse: 78  SpO2: 97%  Weight: 185 lb 3.2 oz (84 kg)  Height: 5\' 2"  (1.575 m)    GEN- The patient is well appearing, alert and oriented x 3 today.   Head- normocephalic, atraumatic Eyes-  Sclera clear, conjunctiva pink Ears- hearing intact Oropharynx- clear Lungs- Clear to ausculation bilaterally, normal work of breathing Heart- Regular rate and rhythm, no murmurs, rubs or gallops, PMI not laterally displaced GI- soft, NT, ND, + BS Extremities- no clubbing,  cyanosis, or edema Skin- ILR site is healed but skin is red and mildly indurated.  Not warm, draining, or fluctuant. I have advised that she use maderma and hydrocortisone as needed.  Wt Readings from Last 3 Encounters:  01/29/18 185 lb 3.2 oz (84 kg)  11/30/17 191 lb 6.4 oz (86.8 kg)  11/02/17 192 lb 12.7 oz (87.5 kg)     Assessment and Plan:  1. Cryptogenic stroke No afib on ILR.  Unfortunately, her ILR was removed due to allergic reaction to the device.  She has healed.  I have advised that she may want to consider Apple Watch v 4 or 5 for further monitoring.  2. HTN Stable No change required today   Return as needed   Thompson Grayer MD, [facility] 01/29/2018 11:53 AM

## 2018-01-31 ENCOUNTER — Ambulatory Visit (INDEPENDENT_AMBULATORY_CARE_PROVIDER_SITE_OTHER): Payer: Medicare Other | Admitting: Adult Health

## 2018-01-31 ENCOUNTER — Encounter: Payer: Self-pay | Admitting: Adult Health

## 2018-01-31 VITALS — BP 142/86 | HR 72 | Wt 187.0 lb

## 2018-01-31 DIAGNOSIS — E1169 Type 2 diabetes mellitus with other specified complication: Secondary | ICD-10-CM

## 2018-01-31 DIAGNOSIS — I639 Cerebral infarction, unspecified: Secondary | ICD-10-CM

## 2018-01-31 DIAGNOSIS — E669 Obesity, unspecified: Secondary | ICD-10-CM

## 2018-01-31 DIAGNOSIS — Z9989 Dependence on other enabling machines and devices: Secondary | ICD-10-CM | POA: Diagnosis not present

## 2018-01-31 DIAGNOSIS — I10 Essential (primary) hypertension: Secondary | ICD-10-CM | POA: Diagnosis not present

## 2018-01-31 DIAGNOSIS — G4733 Obstructive sleep apnea (adult) (pediatric): Secondary | ICD-10-CM | POA: Diagnosis not present

## 2018-01-31 DIAGNOSIS — E785 Hyperlipidemia, unspecified: Secondary | ICD-10-CM

## 2018-01-31 NOTE — Patient Instructions (Addendum)
Continue clopidogrel 75 mg daily  and Cretor  for secondary stroke prevention  Continue to follow up with PCP regarding cholesterol, blood pressure and diabetes management   Headaches - continue tylenol as needed along with trialing heat and ice. If headaches continues or worsen and would like to start on medication such as Topamax, please call office.   Continue to monitor blood pressure at home  Continue to stay active and maintain a healthy diet  Maintain strict control of hypertension with blood pressure goal below 130/90, diabetes with hemoglobin A1c goal below 6.5% and cholesterol with LDL cholesterol (bad cholesterol) goal below 70 mg/dL. I also advised the patient to eat a healthy diet with plenty of whole grains, cereals, fruits and vegetables, exercise regularly and maintain ideal body weight.  Followup in the future with me in 6 months or call earlier if needed       Thank you for coming to see [place] at [facility]. I hope we have been able to provide you high quality care today.  You may receive a patient satisfaction survey over the next few weeks. We would appreciate your feedback and comments so that we may continue to improve ourselves and the health of our patients.

## 2018-01-31 NOTE — Progress Notes (Signed)
[facility] [address] [place] [address] [phone]       OFFICE FOLLOW UP NOTE  [name] Date of Birth:  1950/10/09 Medical Record Number:  [id number]   Reason for Referral:  hospital stroke follow up  CHIEF COMPLAINT:  Chief Complaint  Patient presents with  . Follow-up    Stroke follow up room 9 pt with pt had loop recorder but have to remove due reaction    HPI: Christine Navarro is being seen today for initial visit in the office for cryptogenic right BG lacunar infarct on 09/29/2017. History obtained from patient, daughter and chart review. Reviewed all radiology images and labs personally.  [name] is a 67 y.o. female with PMH of HTN, HLD, DM and OSA who presented with slurred speech and a facial droop.  CT head reviewed and was negative for acute abnormality.  CTA head and neck negative for stenosis.  MRI head reviewed and showed 2.3 cm acute ischemic nonhemorrhagic right basal ganglia lacunar infarct along with 13 mm meningioma overlying the left frontal convexity without associated mass-effect.  2D echo showed an EF of 55 to 65% without cardiac source of embolus.  It was recommended to follow-up outpatient with a TEE/ILR due to cryptogenic etiology as patient requesting to be discharged home and agreed to do the studies outpatient.  LDL 83 and recommended continuation of current statin of atorvastatin 80 mg.  A1c 7.1 and recommended tight glycemic control with close PCP follow-up.  Patient does have a history of OSA and recommended continuation of compliance with CPAP at home for OSA management.  Recommended DAPT with aspirin and Plavix for 3 weeks then Plavix alone.  Patient was discharged home in stable condition.  Patient is being seen today for hospital stroke follow-up.  She continues to have some expressive aphasia but has seen an improvement.  She states that her speech had not resolved prior to leaving the hospital and it  remained about the same when she was home and since then has been seeing improvement.  She did not undergo speech therapy but does do home exercises and continues to have frequent conversations with friends and family members to help improve.  She does state that her speech worsens towards the end of the day.  She was on Lipitor after hospital discharge but due to possible statin myalgias, PCP restarted Crestor 40 mg daily which she was on previously.  Muscle aches did resolve once Lipitor was stopped.  Per daughter, patient was not taking Crestor continuously but patient states compliance with all medications at this point.  Continues to take Plavix as she completed 3 weeks of DAPT and denies side effects of bleeding or bruising.  Blood pressure today 143/83 and does monitor this at home with SBP typically less than 140.  She currently is undergoing occupational therapy and will start PT tomorrow.  She does have a history of OSA and has been compliant with CPAP.  She does complain of daily pain on left side of her head which has been new since her stroke in which she takes Tylenol for.  Denies any other symptoms associated with these headaches and denies any history of headaches/migraines.  Patient has returned back to all previous activities without complications.  Patient requesting whether she can start driving at this time.  Scheduled TEE/ILR on 11/02/2017.  Denies new or worsening stroke/TIA symptoms.  Interval history 01/31/2018: Patient is being seen today for follow-up visit.  Patient did undergo TEE on 11/02/2017 without evidence of cardiac thrombus therefore loop recorder placed.  Unfortunately, patient experienced allergic reaction and ultimately had a loop recorder removed. Incision healing well but does still continue to complain of itching but denies any drainage. Atrial fibrillation was not found while it was implanted.  Was advised for her to consider using apple watch v4 or 5 for further monitoring  by Dr. Rayann Heman.  She continues to take Plavix without side effects of bleeding or bruising.  Continues to take Crestor without side effects myalgias.  Blood pressure today satisfactory 142/86 but does continue to monitor at home and typically SBP 130s/60s. Continues to use CPAP for OSA management. Continues to have some expressive aphasia at times but overall improving. Worsening with stress or if she tries to think of answer quickly. Does endorse pain located on right side of head above ear and at times can radiate into eye. Daily headaches rates 3/10 pain. Will take tylenol occasionally with relief. Denies debilitating pain and is able to continue to function. No further concerns at this time.    ROS:   14 system review of systems performed and negative with exception of palpitations, blurred vision, eye pain, apnea, aching muscles and headaches   PMH:  Past Medical History:  Diagnosis Date  . Anxiety   . Arthritis   . Asthma   . Basal cell adenocarcinoma    nose  . Diabetes mellitus without complication ([place])   . History of kidney stones   . Hypertension   . PONV (postoperative nausea and vomiting)   . Sleep apnea    mild per pt-  no CPAP  . Stroke (cerebrum) ([facility])     PSH:  Past Surgical History:  Procedure Laterality Date  . ABDOMINAL HYSTERECTOMY    . APPENDECTOMY    . BREAST BIOPSY    . BREAST EXCISIONAL BIOPSY    . BREAST SURGERY     x 3  . CHOLECYSTECTOMY    . CYSTOSCOPY WITH RETROGRADE PYELOGRAM, URETEROSCOPY AND STENT PLACEMENT Right 12/23/2015   Procedure: CYSTOSCOPY WITH RETROGRADE PYELOGRAM, URETEROSCOPY AND STENT PLACEMENT;  Surgeon: Alexis Frock, MD;  Location: [facility];  Service: Urology;  Laterality: Right;  . DIAGNOSTIC LAPAROSCOPY    . HOLMIUM LASER APPLICATION Right 40/98/1191   Procedure: HOLMIUM LASER APPLICATION;  Surgeon: Alexis Frock, MD;  Location: [facility];  Service: Urology;  Laterality: Right;  . loop recoder remove    . LOOP RECORDER INSERTION N/A  11/02/2017   Procedure: LOOP RECORDER INSERTION;  Surgeon: Thompson Grayer, MD;  Location: [facility];  Service: Cardiovascular;  Laterality: N/A;  . salpingectomy with oophorectomy    . TEE WITHOUT CARDIOVERSION N/A 11/02/2017   Procedure: TRANSESOPHAGEAL ECHOCARDIOGRAM (TEE);  Surgeon: Fay Records, MD;  Location: [facility]COPY;  Service: Cardiovascular;  Laterality: N/A;  loop recorder, bubble study    Social History:  Social History   Socioeconomic History  . Marital status: Divorced    Spouse name: Not on file  . Number of children: Not on file  . Years of education: Not on file  . Highest education level: Not on file  Occupational History  . Not on file  Social Needs  . Financial resource strain: Not on file  . Food insecurity:    Worry: Not on file    Inability: Not on file  . Transportation needs:    Medical: Not on file    Non-medical: Not on file  Tobacco Use  .  Smoking status: Never Navarro  . Smokeless tobacco: Never Used  Substance and Sexual Activity  . Alcohol use: No  . Drug use: No  . Sexual activity: Not on file  Lifestyle  . Physical activity:    Days per week: Not on file    Minutes per session: Not on file  . Stress: Not on file  Relationships  . Social connections:    Talks on phone: Not on file    Gets together: Not on file    Attends religious service: Not on file    Active member of club or organization: Not on file    Attends meetings of clubs or organizations: Not on file    Relationship status: Not on file  . Intimate partner violence:    Fear of current or ex partner: Not on file    Emotionally abused: Not on file    Physically abused: Not on file    Forced sexual activity: Not on file  Other Topics Concern  . Not on file  Social History Narrative  . Not on file    Family History:  Family History  Problem Relation Age of Onset  . Cancer Mother   . Breast cancer Mother   . Heart disease Father   . Stroke Paternal Aunt      Medications:   Current Outpatient Medications on File Prior to Visit  Medication Sig Dispense Refill  . clopidogrel (PLAVIX) 75 MG tablet Take 1 tablet (75 mg total) by mouth daily. 30 tablet 0  . hydrochlorothiazide (HYDRODIURIL) 25 MG tablet Take 1 tablet by mouth as needed for fluid.  0  . irbesartan (AVAPRO) 300 MG tablet Take 300 mg by mouth daily.    Marland Kitchen LORazepam (ATIVAN) 1 MG tablet Take 0.5 mg by mouth 2 (two) times daily as needed for anxiety or sleep.     . metFORMIN (GLUCOPHAGE) 500 MG tablet Take 500 mg by mouth 2 (two) times daily.    . metoprolol tartrate (LOPRESSOR) 25 MG tablet Take 25 mg by mouth 2 (two) times daily.     Marland Kitchen oxybutynin (DITROPAN-XL) 5 MG 24 hr tablet Take 5 mg by mouth daily.    Marland Kitchen oxymetazoline (AFRIN) 0.05 % nasal spray Place 1 spray into both nostrils daily as needed for congestion.    . ranitidine (ZANTAC) 150 MG tablet Take 150 mg by mouth daily.    . rosuvastatin (CRESTOR) 40 MG tablet Take 40 mg by mouth daily.    Marland Kitchen senna-docusate (SENOKOT-S) 8.6-50 MG tablet Take 1 tablet by mouth 2 (two) times daily. While taking pain meds to prevent constipation. 30 tablet 0  . trolamine salicylate (ASPERCREME) 10 % cream Apply 1 application topically daily as needed for muscle pain.     No current facility-administered medications on file prior to visit.     Allergies:   Allergies  Allergen Reactions  . Ace Inhibitors Cough  . Ciprofloxacin Other (See Comments)    Muscle Cramps  . Sular [Nisoldipine Er] Other (See Comments)    HEART RACING  . Vitamin D Analogs Nausea And Vomiting  . Welchol [Colesevelam Hcl] Nausea And Vomiting  . Erythromycin Nausea And Vomiting  . Nickel Itching, Dermatitis and Rash    Possible nickel allergy. Loop recorder explanted 11/30/17.  . Sulfa Antibiotics Rash  . Tape Rash    Use paper tape please     Physical Exam  Vitals:   01/31/18 0933  BP: (!) 142/86  Pulse: 72  Weight: 187  lb (84.8 kg)   Body mass index is  34.2 kg/m. No exam data present  General: Obese pleasant middle-aged Caucasian female, seated, in no evident distress Head: head normocephalic and atraumatic.   Neck: supple with no carotid or supraclavicular bruits Cardiovascular: regular rate and rhythm, no murmurs Musculoskeletal: no deformity Skin:  no rash/petichiae Vascular:  Normal pulses all extremities  Neurologic Exam Mental Status: Awake and fully alert. Oriented to place and time. Recent and remote memory intact. Attention span, concentration and fund of knowledge appropriate. Mood and affect appropriate.  Cranial Nerves: Fundoscopic exam reveals sharp disc margins. Pupils equal, briskly reactive to light. Extraocular movements full without nystagmus. Visual fields full to confrontation. Hearing intact. Facial sensation intact. Face, tongue, palate moves normally and symmetrically.  Motor: Normal bulk and tone. Normal strength in all tested extremity muscles. Sensory.: intact to touch , pinprick , position and vibratory sensation.  Coordination: Rapid alternating movements normal in all extremities. Finger-to-nose and heel-to-shin performed accurately bilaterally. Gait and Station: Arises from chair without difficulty. Stance is normal. Gait demonstrates normal stride length and balance . Able to heel, toe and tandem walk without difficulty.  Reflexes: 1+ and symmetric. Toes downgoing.      Diagnostic Data (Labs, Imaging, Testing)  CT head without contrast CTA head/neck with and without contrast 09/29/2017 IMPRESSION: CT HEAD: 1. Normal CT HEAD with without contrast for age. CTA NECK: 1. No hemodynamically significant stenosis ICA. 2. Patent vertebral arteries. CTA HEAD: 1. No emergent large vessel occlusion or flow-limiting stenosis. 2. Moderate stenosis LEFT PCA most compatible with atherosclerosis  MRI brain without contrast 09/30/2017 IMPRESSION: 1. 2.3 cm acute ischemic nonhemorrhagic right basal ganglia  lacunar infarct. 2. 13 mm meningioma overlying the left frontal convexity without associated mass effect. 3. Otherwise normal brain MRI for age.   ASSESSMENT: Christine Navarro is a 67 y.o. year old female here with right BG lacunar infarct on 09/29/2017 secondary to cryptogenic etiology with concern for possible cardioembolic source and recommended outpatient TEE/ILR. Vascular risk factors include HTN, HLD, DM and OSA.  TEE performed on 11/02/2017 along with ILR placement but unfortunately obtained allergic reaction to ILR therefore was removed.  No atrial fibrillation was found.  Patient is being seen today for follow-up visit and overall has been improving well with only intermittent expressive aphasia along with mild headaches that have been present since hospital discharge.    PLAN: -Continue clopidogrel 75 mg daily  and Crestor for secondary stroke prevention -F/u with PCP regarding your HLD, HTN and DM management -Daily headaches -patient declines use of prescription medication such as Topamax as headaches are not debilitating.  Advised her in the future if they worsen or would prefer to start on a medication regimen, to call office -information regarding Topamax provided to patient -Advised to continue to stay active and maintain a healthy diet -continue to monitor BP at home -Maintain strict control of hypertension with blood pressure goal below 130/90, diabetes with hemoglobin A1c goal below 6.5% and cholesterol with LDL cholesterol (bad cholesterol) goal below 70 mg/dL. I also advised the patient to eat a healthy diet with plenty of whole grains, cereals, fruits and vegetables, exercise regularly and maintain ideal body weight.  Follow up in 6 months or call earlier if needed   Greater than 50% of time during this 25 minute visit was spent on counseling,explanation of diagnosis of right BG lacunar infarct, reviewing risk factor management of HTN, HLD, DM and OSA, planning of further  management,  discussion with patient and family and coordination of care    Venancio Poisson, [facility]  [facility] [address] [place], [place] [address]  Phone [phone] Fax [phone]

## 2018-01-31 NOTE — Progress Notes (Signed)
I agree with the above plan 

## 2018-02-12 DIAGNOSIS — E119 Type 2 diabetes mellitus without complications: Secondary | ICD-10-CM | POA: Diagnosis not present

## 2018-02-12 DIAGNOSIS — H2513 Age-related nuclear cataract, bilateral: Secondary | ICD-10-CM | POA: Diagnosis not present

## 2018-02-12 DIAGNOSIS — H40013 Open angle with borderline findings, low risk, bilateral: Secondary | ICD-10-CM | POA: Diagnosis not present

## 2018-02-12 DIAGNOSIS — Z83511 Family history of glaucoma: Secondary | ICD-10-CM | POA: Diagnosis not present

## 2018-02-19 ENCOUNTER — Ambulatory Visit
Admission: RE | Admit: 2018-02-19 | Discharge: 2018-02-19 | Disposition: A | Discharge status: Home / Self Care | Payer: Medicare Other | Source: Ambulatory Visit | Attending: Family Medicine | Admitting: Family Medicine

## 2018-02-19 DIAGNOSIS — Z1231 Encounter for screening mammogram for malignant neoplasm of breast: Secondary | ICD-10-CM

## 2018-02-21 ENCOUNTER — Other Ambulatory Visit: Payer: Self-pay | Admitting: Family Medicine

## 2018-02-21 DIAGNOSIS — R928 Other abnormal and inconclusive findings on diagnostic imaging of breast: Secondary | ICD-10-CM

## 2018-02-23 ENCOUNTER — Ambulatory Visit: Payer: Medicare Other

## 2018-02-23 ENCOUNTER — Ambulatory Visit
Admission: RE | Admit: 2018-02-23 | Discharge: 2018-02-23 | Disposition: A | Discharge status: Home / Self Care | Payer: Medicare Other | Source: Ambulatory Visit | Attending: Family Medicine | Admitting: Family Medicine

## 2018-02-23 DIAGNOSIS — R928 Other abnormal and inconclusive findings on diagnostic imaging of breast: Secondary | ICD-10-CM | POA: Diagnosis not present

## 2018-03-21 DIAGNOSIS — I517 Cardiomegaly: Secondary | ICD-10-CM | POA: Diagnosis not present

## 2018-03-21 DIAGNOSIS — E7800 Pure hypercholesterolemia, unspecified: Secondary | ICD-10-CM | POA: Diagnosis not present

## 2018-03-21 DIAGNOSIS — E669 Obesity, unspecified: Secondary | ICD-10-CM | POA: Diagnosis not present

## 2018-03-21 DIAGNOSIS — E559 Vitamin D deficiency, unspecified: Secondary | ICD-10-CM | POA: Diagnosis not present

## 2018-03-21 DIAGNOSIS — F411 Generalized anxiety disorder: Secondary | ICD-10-CM | POA: Diagnosis not present

## 2018-03-21 DIAGNOSIS — D329 Benign neoplasm of meninges, unspecified: Secondary | ICD-10-CM | POA: Diagnosis not present

## 2018-03-21 DIAGNOSIS — I519 Heart disease, unspecified: Secondary | ICD-10-CM | POA: Diagnosis not present

## 2018-03-21 DIAGNOSIS — D179 Benign lipomatous neoplasm, unspecified: Secondary | ICD-10-CM | POA: Diagnosis not present

## 2018-03-21 DIAGNOSIS — E1169 Type 2 diabetes mellitus with other specified complication: Secondary | ICD-10-CM | POA: Diagnosis not present

## 2018-03-21 DIAGNOSIS — I10 Essential (primary) hypertension: Secondary | ICD-10-CM | POA: Diagnosis not present

## 2018-03-21 DIAGNOSIS — I639 Cerebral infarction, unspecified: Secondary | ICD-10-CM | POA: Diagnosis not present

## 2018-03-21 DIAGNOSIS — Z0000 Encounter for general adult medical examination without abnormal findings: Secondary | ICD-10-CM | POA: Diagnosis not present

## 2018-05-18 DIAGNOSIS — D1739 Benign lipomatous neoplasm of skin and subcutaneous tissue of other sites: Secondary | ICD-10-CM | POA: Diagnosis not present

## 2018-05-18 DIAGNOSIS — T7840XA Allergy, unspecified, initial encounter: Secondary | ICD-10-CM | POA: Diagnosis not present

## 2018-05-24 ENCOUNTER — Other Ambulatory Visit: Payer: Self-pay | Admitting: General Surgery

## 2018-05-24 DIAGNOSIS — D1739 Benign lipomatous neoplasm of skin and subcutaneous tissue of other sites: Secondary | ICD-10-CM

## 2018-05-28 ENCOUNTER — Other Ambulatory Visit: Payer: Self-pay | Admitting: General Surgery

## 2018-05-28 DIAGNOSIS — D1739 Benign lipomatous neoplasm of skin and subcutaneous tissue of other sites: Secondary | ICD-10-CM

## 2018-07-11 DIAGNOSIS — G4733 Obstructive sleep apnea (adult) (pediatric): Secondary | ICD-10-CM | POA: Diagnosis not present

## 2018-07-22 DIAGNOSIS — R319 Hematuria, unspecified: Secondary | ICD-10-CM | POA: Diagnosis not present

## 2018-07-22 DIAGNOSIS — N390 Urinary tract infection, site not specified: Secondary | ICD-10-CM | POA: Diagnosis not present

## 2018-07-31 ENCOUNTER — Telehealth: Payer: Self-pay

## 2018-07-31 NOTE — Telephone Encounter (Signed)
If pt calls back please explain visit will be change to video visit due to COVID 19. I explain we need verbal consent to do video and to file insurance. I stated on vm if she has a phone (android, samsung, galaxy. iphone or computer with camera it can be done with these devices. We need to know her email address and her cell phone carrier to send links to.  VM was left for pt with the above information on vm.

## 2018-07-31 NOTE — Telephone Encounter (Signed)
6 mos [place] 07-31-18 Pt gave verbal consent to file insurance for [email or web address] vv pt email:[email or web address]  Pt understands that although there may be some limitations with this type of visit, we will take all precautions to reduce any security or privacy concerns.  Pt understands that this will be treated like an in office visit and we will file with pt's insurance, and there may be a patient responsible charge related to this service.

## 2018-07-31 NOTE — Telephone Encounter (Signed)
Email link sent to patient today at 306pm.

## 2018-08-01 ENCOUNTER — Encounter: Payer: Self-pay | Admitting: Adult Health

## 2018-08-01 ENCOUNTER — Ambulatory Visit (INDEPENDENT_AMBULATORY_CARE_PROVIDER_SITE_OTHER): Payer: Medicare Other | Admitting: Adult Health

## 2018-08-01 ENCOUNTER — Other Ambulatory Visit: Payer: Self-pay

## 2018-08-01 DIAGNOSIS — E785 Hyperlipidemia, unspecified: Secondary | ICD-10-CM

## 2018-08-01 DIAGNOSIS — E1169 Type 2 diabetes mellitus with other specified complication: Secondary | ICD-10-CM

## 2018-08-01 DIAGNOSIS — E669 Obesity, unspecified: Secondary | ICD-10-CM | POA: Diagnosis not present

## 2018-08-01 DIAGNOSIS — G4733 Obstructive sleep apnea (adult) (pediatric): Secondary | ICD-10-CM | POA: Diagnosis not present

## 2018-08-01 DIAGNOSIS — I639 Cerebral infarction, unspecified: Secondary | ICD-10-CM | POA: Diagnosis not present

## 2018-08-01 DIAGNOSIS — I10 Essential (primary) hypertension: Secondary | ICD-10-CM | POA: Diagnosis not present

## 2018-08-01 DIAGNOSIS — Z9989 Dependence on other enabling machines and devices: Secondary | ICD-10-CM

## 2018-08-01 NOTE — Progress Notes (Signed)
[facility] [address] [place]. [place] [address] [phone]       FOLLOW UP NOTE  [name] Date of Birth:  [birth year] Medical Record Number:  [id number]   Reason for visit: Stroke follow up  Virtual Visit via Video Note  I connected with Christine Navarro on 08/01/18 at 10:15 AM EDT by a video enabled telemedicine application located remotely in my own home and verified that I am speaking with the correct person using two identifiers who was located at their own home.   I discussed the limitations of evaluation and management by telemedicine and the availability of in person appointments. The patient expressed understanding and agreed to proceed.   HPI:   Arleatha Philipps continues to be followed in this office regarding cryptogenic right BG lacunar infarct in 09/2017 with office visit scheduled today for follow-up but due to COVID-19 safety precautions, visit transition to telemedicine via [email or web address] with patient's consent.   [name] is a 68 y.o. female with PMH of HTN, HLD, DM and OSA who presented with slurred speech and a facial droop.  Stroke work-up revealed right basal ganglia lacunar infarct along with 13 mm meningioma overlying the left frontal convexity without associated mass-effect.  2D echo unremarkable.  Recommended TEE/ILR outpatient per patient's request.  Recommended DAPT with aspirin and Plavix for 3 weeks then Plavix alone.  Patient was discharged home in stable condition with residual deficit of expressive aphasia.  Patient did undergo TEE on 11/02/2017 without evidence of cardiac thrombus therefore loop recorder placed.  Unfortunately, patient experienced allergic reaction and ultimately had a loop recorder removed.  Loop recorder did not show evidence of atrial fibrillation while implanted.  She has been stable from a stroke standpoint with residual mild expressive aphasia with improvement. She does endorse off  balance sensation when leaning forward occasionally as well as electrical shock sensation that radiates from right occipital to right orbital lasting only a couple seconds occurring a couple times weekly.  This has been occurring since her stroke.  Denies any visual changes or any other symptoms associated.  Daily headaches that were discussed at prior visit have subsided.  Continues on clopidogrel and Crestor without reported side effects.  Blood pressure stable. Glucose levels stable. Repeat A1c in July with PCP.  Ongoing compliance with CPAP for OSA.  No further concerns at this time.  Denies new or worsening stroke/TIA symptoms.     ROS:   14 system review of systems performed and negative with exception of apnea and headaches   PMH:  Past Medical History:  Diagnosis Date   Anxiety    Arthritis    Asthma    Basal cell adenocarcinoma    nose   Diabetes mellitus without complication ([place])    History of kidney stones    Hypertension    PONV (postoperative nausea and vomiting)    Sleep apnea    mild per pt-  no CPAP   Stroke (cerebrum) (HCC)     PSH:  Past Surgical History:  Procedure Laterality Date   ABDOMINAL HYSTERECTOMY     APPENDECTOMY     BREAST BIOPSY     BREAST EXCISIONAL BIOPSY     BREAST SURGERY     x 3   CHOLECYSTECTOMY     CYSTOSCOPY WITH RETROGRADE PYELOGRAM, URETEROSCOPY AND STENT PLACEMENT Right 12/23/2015   Procedure: CYSTOSCOPY WITH RETROGRADE PYELOGRAM, URETEROSCOPY AND STENT PLACEMENT;  Surgeon: Alexis Frock, MD;  Location: [facility];  Service: Urology;  Laterality: Right;   DIAGNOSTIC LAPAROSCOPY     HOLMIUM LASER APPLICATION Right 00/86/7619   Procedure: HOLMIUM LASER APPLICATION;  Surgeon: Alexis Frock, MD;  Location: [facility];  Service: Urology;  Laterality: Right;   loop recoder remove     LOOP RECORDER INSERTION N/A 11/02/2017   Procedure: LOOP RECORDER INSERTION;  Surgeon: Thompson Grayer, MD;  Location: [facility];   Service: Cardiovascular;  Laterality: N/A;   salpingectomy with oophorectomy     TEE WITHOUT CARDIOVERSION N/A 11/02/2017   Procedure: TRANSESOPHAGEAL ECHOCARDIOGRAM (TEE);  Surgeon: Fay Records, MD;  Location[facility];  Service: Cardiovascular;  Laterality: N/A;  loop recorder, bubble study    Social History:  Social History   Socioeconomic History   Marital status: Divorced    Spouse name: Not on file   Number of children: Not on file   Years of education: Not on file   Highest education level: Not on file  Occupational History   Not on file  Social Needs   Financial resource strain: Not on file   Food insecurity:    Worry: Not on file    Inability: Not on file   Transportation needs:    Medical: Not on file    Non-medical: Not on file  Tobacco Use   Smoking status: Never Smoker   Smokeless tobacco: Never Used  Substance and Sexual Activity   Alcohol use: No   Drug use: No   Sexual activity: Not on file  Lifestyle   Physical activity:    Days per week: Not on file    Minutes per session: Not on file   Stress: Not on file  Relationships   Social connections:    Talks on phone: Not on file    Gets together: Not on file    Attends religious service: Not on file    Active member of club or organization: Not on file    Attends meetings of clubs or organizations: Not on file    Relationship status: Not on file   Intimate partner violence:    Fear of current or ex partner: Not on file    Emotionally abused: Not on file    Physically abused: Not on file    Forced sexual activity: Not on file  Other Topics Concern   Not on file  Social History Narrative   Not on file    Family History:  Family History  Problem Relation Age of Onset   Cancer Mother    Breast cancer Mother    Heart disease Father    Stroke Paternal Aunt     Medications:   Current Outpatient Medications on File Prior to Visit  Medication Sig Dispense Refill    clopidogrel (PLAVIX) 75 MG tablet Take 1 tablet (75 mg total) by mouth daily. 30 tablet 0   hydrochlorothiazide (HYDRODIURIL) 25 MG tablet Take 1 tablet by mouth as needed for fluid.  0   irbesartan (AVAPRO) 300 MG tablet Take 300 mg by mouth daily.     LORazepam (ATIVAN) 1 MG tablet Take 0.5 mg by mouth 2 (two) times daily as needed for anxiety or sleep.      metFORMIN (GLUCOPHAGE) 500 MG tablet Take 500 mg by mouth 2 (two) times daily.     metoprolol tartrate (LOPRESSOR) 25 MG tablet Take 25 mg by mouth 2 (two) times daily.      oxybutynin (DITROPAN-XL) 5 MG 24 hr tablet Take 5 mg by mouth daily.  oxymetazoline (AFRIN) 0.05 % nasal spray Place 1 spray into both nostrils daily as needed for congestion.     ranitidine (ZANTAC) 150 MG tablet Take 150 mg by mouth daily.     rosuvastatin (CRESTOR) 40 MG tablet Take 40 mg by mouth daily.     senna-docusate (SENOKOT-S) 8.6-50 MG tablet Take 1 tablet by mouth 2 (two) times daily. While taking pain meds to prevent constipation. 30 tablet 0   trolamine salicylate (ASPERCREME) 10 % cream Apply 1 application topically daily as needed for muscle pain.     No current facility-administered medications on file prior to visit.     Allergies:   Allergies  Allergen Reactions   Ace Inhibitors Cough   Ciprofloxacin Other (See Comments)    Muscle Cramps   Sular [Nisoldipine Er] Other (See Comments)    HEART RACING   Vitamin D Analogs Nausea And Vomiting   Welchol [Colesevelam Hcl] Nausea And Vomiting   Erythromycin Nausea And Vomiting   Nickel Itching, Dermatitis and Rash    Possible nickel allergy. Loop recorder explanted 11/30/17.   Sulfa Antibiotics Rash   Tape Rash    Use paper tape please     Physical Exam  General: well developed, well nourished, pleasant middle-aged Caucasian female, seated, in no evident distress Head: head normocephalic and atraumatic.    Neurologic Exam Mental Status: Awake and fully alert.  Mild  speech hesitancy.  Oriented to place and time. Recent and remote memory intact. Attention span, concentration and fund of knowledge appropriate. Mood and affect appropriate.  Cranial Nerves: Extraocular movements full without nystagmus. Hearing intact to voice. Facial sensation intact. Face, tongue, palate moves normally and symmetrically.  Shoulder shrug symmetric. Motor: No evidence of weakness per drift assessment Sensory.: intact to light touch Coordination: Rapid alternating movements normal in all extremities. Finger-to-nose and heel-to-shin performed accurately bilaterally. Gait and Station: Arises from chair without difficulty. Stance is normal. Gait demonstrates normal stride length and balance .  Reflexes: UTA    Diagnostic Data (Labs, Imaging, Testing)  CT head without contrast CTA head/neck with and without contrast 09/29/2017 IMPRESSION: CT HEAD: 1. Normal CT HEAD with without contrast for age. CTA NECK: 1. No hemodynamically significant stenosis ICA. 2. Patent vertebral arteries. CTA HEAD: 1. No emergent large vessel occlusion or flow-limiting stenosis. 2. Moderate stenosis LEFT PCA most compatible with atherosclerosis  MRI brain without contrast 09/30/2017 IMPRESSION: 1. 2.3 cm acute ischemic nonhemorrhagic right basal ganglia lacunar infarct. 2. 13 mm meningioma overlying the left frontal convexity without associated mass effect. 3. Otherwise normal brain MRI for age.   ASSESSMENT: Christine Navarro is a 68 y.o. year old female here with right BG lacunar infarct on 09/29/2017 secondary to cryptogenic etiology with concern for possible cardioembolic source and recommended outpatient TEE/ILR. Vascular risk factors include HTN, HLD, DM and OSA.  TEE performed on 11/02/2017 along with ILR placement but unfortunately obtained allergic reaction to ILR therefore was removed.  No atrial fibrillation was found.  She has been stable from a stroke standpoint with residual mild  expressive aphasia.    PLAN: -Continue clopidogrel 75 mg daily  and Crestor for secondary stroke prevention -Electrical shock type head sensation likely residual deficit from stroke.  Declines interest in medication management.  Advised to notify us with any worsening headaches or headaches associated with other neurological symptoms.  Discussion regarding stress relaxation techniques -F/u with PCP regarding your HLD, HTN and DM management -Advised to continue to stay active and maintain a  healthy diet -continue to monitor BP and glucose levels at home -Maintain strict control of hypertension with blood pressure goal below 130/90, diabetes with hemoglobin A1c goal below 6.5% and cholesterol with LDL cholesterol (bad cholesterol) goal below 70 mg/dL. I also advised the patient to eat a healthy diet with plenty of whole grains, cereals, fruits and vegetables, exercise regularly and maintain ideal body weight.  Stable from stroke standpoint and recommend follow-up as needed   Greater than 50% of time during this 15 minute non-face-to-face visit was spent on counseling,explanation of diagnosis of right BG lacunar infarct, reviewing risk factor management of HTN, HLD, DM and OSA, planning of further management, discussion with patient and family and coordination of care    Venancio Poisson, [facility]  [facility] [address] [place], [place] [address]  Phone [phone] Fax [phone]

## 2018-08-02 DIAGNOSIS — N200 Calculus of kidney: Secondary | ICD-10-CM | POA: Diagnosis not present

## 2018-08-02 DIAGNOSIS — R300 Dysuria: Secondary | ICD-10-CM | POA: Diagnosis not present

## 2018-08-02 DIAGNOSIS — R34 Anuria and oliguria: Secondary | ICD-10-CM | POA: Diagnosis not present

## 2018-08-02 DIAGNOSIS — N281 Cyst of kidney, acquired: Secondary | ICD-10-CM | POA: Diagnosis not present

## 2018-08-03 NOTE — Progress Notes (Signed)
I agree with the above plan 

## 2018-08-17 DIAGNOSIS — R3915 Urgency of urination: Secondary | ICD-10-CM | POA: Diagnosis not present

## 2018-08-17 DIAGNOSIS — R300 Dysuria: Secondary | ICD-10-CM | POA: Diagnosis not present

## 2018-08-17 DIAGNOSIS — R350 Frequency of micturition: Secondary | ICD-10-CM | POA: Diagnosis not present

## 2018-08-17 DIAGNOSIS — N200 Calculus of kidney: Secondary | ICD-10-CM | POA: Diagnosis not present

## 2018-08-17 DIAGNOSIS — N3000 Acute cystitis without hematuria: Secondary | ICD-10-CM | POA: Diagnosis not present

## 2018-08-23 DIAGNOSIS — R350 Frequency of micturition: Secondary | ICD-10-CM | POA: Diagnosis not present

## 2018-08-23 DIAGNOSIS — R3915 Urgency of urination: Secondary | ICD-10-CM | POA: Diagnosis not present

## 2018-08-23 DIAGNOSIS — N200 Calculus of kidney: Secondary | ICD-10-CM | POA: Diagnosis not present

## 2018-08-30 DIAGNOSIS — H40013 Open angle with borderline findings, low risk, bilateral: Secondary | ICD-10-CM | POA: Diagnosis not present

## 2018-09-26 DIAGNOSIS — E7800 Pure hypercholesterolemia, unspecified: Secondary | ICD-10-CM | POA: Diagnosis not present

## 2018-09-26 DIAGNOSIS — E1169 Type 2 diabetes mellitus with other specified complication: Secondary | ICD-10-CM | POA: Diagnosis not present

## 2018-09-26 DIAGNOSIS — Z7984 Long term (current) use of oral hypoglycemic drugs: Secondary | ICD-10-CM | POA: Diagnosis not present

## 2018-10-01 DIAGNOSIS — I10 Essential (primary) hypertension: Secondary | ICD-10-CM | POA: Diagnosis not present

## 2018-10-01 DIAGNOSIS — E7800 Pure hypercholesterolemia, unspecified: Secondary | ICD-10-CM | POA: Diagnosis not present

## 2018-10-01 DIAGNOSIS — F411 Generalized anxiety disorder: Secondary | ICD-10-CM | POA: Diagnosis not present

## 2018-10-01 DIAGNOSIS — Z7984 Long term (current) use of oral hypoglycemic drugs: Secondary | ICD-10-CM | POA: Diagnosis not present

## 2018-10-01 DIAGNOSIS — Z7189 Other specified counseling: Secondary | ICD-10-CM | POA: Diagnosis not present

## 2018-10-01 DIAGNOSIS — E1169 Type 2 diabetes mellitus with other specified complication: Secondary | ICD-10-CM | POA: Diagnosis not present

## 2018-10-09 DIAGNOSIS — H00012 Hordeolum externum right lower eyelid: Secondary | ICD-10-CM | POA: Diagnosis not present

## 2018-10-16 ENCOUNTER — Other Ambulatory Visit: Payer: Medicare Other

## 2018-12-07 DIAGNOSIS — Z23 Encounter for immunization: Secondary | ICD-10-CM | POA: Diagnosis not present

## 2019-01-01 DIAGNOSIS — L820 Inflamed seborrheic keratosis: Secondary | ICD-10-CM | POA: Diagnosis not present

## 2019-01-01 DIAGNOSIS — Z85828 Personal history of other malignant neoplasm of skin: Secondary | ICD-10-CM | POA: Diagnosis not present

## 2019-01-01 DIAGNOSIS — L570 Actinic keratosis: Secondary | ICD-10-CM | POA: Diagnosis not present

## 2019-01-01 DIAGNOSIS — X32XXXD Exposure to sunlight, subsequent encounter: Secondary | ICD-10-CM | POA: Diagnosis not present

## 2019-01-01 DIAGNOSIS — D225 Melanocytic nevi of trunk: Secondary | ICD-10-CM | POA: Diagnosis not present

## 2019-01-01 DIAGNOSIS — Z1283 Encounter for screening for malignant neoplasm of skin: Secondary | ICD-10-CM | POA: Diagnosis not present

## 2019-01-01 DIAGNOSIS — Z08 Encounter for follow-up examination after completed treatment for malignant neoplasm: Secondary | ICD-10-CM | POA: Diagnosis not present

## 2019-01-03 ENCOUNTER — Telehealth: Payer: Self-pay

## 2019-01-14 ENCOUNTER — Other Ambulatory Visit: Payer: Self-pay | Admitting: *Deleted

## 2019-01-14 NOTE — Patient Outreach (Signed)
[facility]) Care Management  [place]  01/14/2019   Christine Navarro 07-Jun-1950 [id number]  [place] telephone call to patient.  Hipaa compliance verified. Per patient she is doing good. Patient gave read off og all blood pressures taken within the last week. Ranging from 165/81- 141/74. Patient is on a gluten free diet. Patient is checking blood sugars and blood pressure and documenting. Patient has an advance directive and living will. She declined to make updates. Per patient she is taking her medications as per ordered. Patient has agreed to follow up outreach calls.    Encounter Medications:  Outpatient Encounter Medications as of 01/14/2019  Medication Sig Note  . clopidogrel (PLAVIX) 75 MG tablet Take 1 tablet (75 mg total) by mouth daily.   . irbesartan (AVAPRO) 300 MG tablet Take 300 mg by mouth daily.   Marland Kitchen LORazepam (ATIVAN) 1 MG tablet Take 0.5 mg by mouth 2 (two) times daily as needed for anxiety or sleep.    . metFORMIN (GLUCOPHAGE) 500 MG tablet Take 500 mg by mouth 2 (two) times daily.   . metoprolol tartrate (LOPRESSOR) 25 MG tablet Take 25 mg by mouth 2 (two) times daily.    Marland Kitchen oxybutynin (DITROPAN-XL) 5 MG 24 hr tablet Take 5 mg by mouth daily.   Marland Kitchen oxymetazoline (AFRIN) 0.05 % nasal spray Place 1 spray into both nostrils daily as needed for congestion.   . rosuvastatin (CRESTOR) 40 MG tablet Take 40 mg by mouth daily.   Marland Kitchen trolamine salicylate (ASPERCREME) 10 % cream Apply 1 application topically daily as needed for muscle pain.   . hydrochlorothiazide (HYDRODIURIL) 25 MG tablet Take 1 tablet by mouth as needed for fluid.   . ranitidine (ZANTAC) 150 MG tablet Take 150 mg by mouth daily. 01/14/2019: Patient takes prevacid  . senna-docusate (SENOKOT-S) 8.6-50 MG tablet Take 1 tablet by mouth 2 (two) times daily. While taking pain meds to prevent constipation. (Patient not taking: Reported on 01/14/2019)    No facility-administered encounter  medications on file as of 01/14/2019.     Functional Status:  In your present state of health, do you have any difficulty performing the following activities: 01/14/2019  Vision? N  Difficulty concentrating or making decisions? N  Walking or climbing stairs? N  Dressing or bathing? N  Doing errands, shopping? N  Preparing Food and eating ? N  Using the Toilet? N  In the past six months, have you accidently leaked urine? Y  Do you have problems with loss of bowel control? N  Managing your Medications? N  Managing your Finances? N  Some recent data might be hidden    Fall/Depression Screening: Fall Risk  01/14/2019 01/31/2018 10/30/2017  Falls in the past year? 0 0 No  Number falls in past yr: 0 - -  Injury with Fall? 0 - -  Risk for fall due to : Impaired balance/gait - -  Follow up Falls evaluation completed - -   PHQ 2/9 Scores 01/14/2019 11/21/2014  PHQ - 2 Score 0 0   THN CM Care Plan Problem One     Most Recent Value  Care Plan Problem One  knowledge Deficit in Self Management of Hypertension  Role Documenting the Problem One  [place] for Problem One  Active  THN Long Term Goal   Patient will have a better knowledge of how to control blood pressure within the next 90 days  THN Long Term Goal Start Date  01/14/19  Interventions for Problem One Long Term Goal  RN discussed checking blood pressure daily and documenting. RN sent A matter of choice Diabetes control booklet. RN will follow up with further discussion  THN CM Short Term Goal #1   Patient will have a better understanding of foods low in sodium within the next 90 days  THN CM Short Term Goal #1 Start Date  01/14/19  Interventions for Short Term Goal #1  RN discussed low sodium foods. RN sent a picture sheet of foods high and low in sodium. RN discussed food preparation of canned versus fresh or frozen vegetables. RN will follow up with furthter discussion  THN CM Short Term Goal #2   Patient will have a  better understanding of how to read food labels within the next 30 days  THN CM Short Term Goal #2 Start Date  01/14/19  Interventions for Short Term Goal #2  RN discussed reading food labels. RN sent a sheet on how to reacd food labels. RN will follow up with further discussion      Assessment:  Patient has living will and health care power of attorney Patient is on gluten free diet Patient is taking medications as per ordered Patient monitors blood pressure and blood sugar at home Patient will benefit from [place] telephonic outreach for education and support for hypertension self management.  Plan:  RN sent Welcome packet RN sent A Matter of Choice Blood pressure control booklet RN discussed low sodium diet RN discussed monitoring blood pressures RN discussed reading food labels RN sent picture sheet on how to read food label RN sent picture of foods high and low in sodium Rn sent barrier letter and assessment to PCP RN will follow up within the month of January  Christine Navarro [place] [facility] [phone]

## 2019-03-26 ENCOUNTER — Other Ambulatory Visit: Payer: Self-pay | Admitting: Family Medicine

## 2019-03-26 DIAGNOSIS — Z1231 Encounter for screening mammogram for malignant neoplasm of breast: Secondary | ICD-10-CM

## 2019-03-27 ENCOUNTER — Other Ambulatory Visit: Payer: Self-pay | Admitting: *Deleted

## 2019-03-27 NOTE — Patient Outreach (Addendum)
[facility]) Care Management  [name]  03/27/2019   Christine Navarro 1951-03-09 [id number]  [place] telephone call to patient.  Hipaa compliance verified.  Per patient she has received the information that was sent. Patient stated that she is trying to read the sodium information and decrease the sodium in her diet. Per patient she has a calendar that she writes her blood pressure and blood sugars in. Patient has not had any recent falls. Patient blood sugars range 137-72-151/75. Per patient she has been having some problems with constipation.  Patient has agreed to follow up outreach calls.  Encounter Medications:  Outpatient Encounter Medications as of 03/27/2019  Medication Sig Note  . clopidogrel (PLAVIX) 75 MG tablet Take 1 tablet (75 mg total) by mouth daily.   . hydrochlorothiazide (HYDRODIURIL) 25 MG tablet Take 1 tablet by mouth as needed for fluid.   Marland Kitchen irbesartan (AVAPRO) 300 MG tablet Take 300 mg by mouth daily.   Marland Kitchen LORazepam (ATIVAN) 1 MG tablet Take 0.5 mg by mouth 2 (two) times daily as needed for anxiety or sleep.    . metFORMIN (GLUCOPHAGE) 500 MG tablet Take 500 mg by mouth 2 (two) times daily.   . metoprolol tartrate (LOPRESSOR) 25 MG tablet Take 25 mg by mouth 2 (two) times daily.    Marland Kitchen oxybutynin (DITROPAN-XL) 5 MG 24 hr tablet Take 5 mg by mouth daily.   Marland Kitchen oxymetazoline (AFRIN) 0.05 % nasal spray Place 1 spray into both nostrils daily as needed for congestion.   . ranitidine (ZANTAC) 150 MG tablet Take 150 mg by mouth daily. 01/14/2019: Patient takes prevacid  . rosuvastatin (CRESTOR) 40 MG tablet Take 40 mg by mouth daily.   Marland Kitchen senna-docusate (SENOKOT-S) 8.6-50 MG tablet Take 1 tablet by mouth 2 (two) times daily. While taking pain meds to prevent constipation. (Patient not taking: Reported on 01/14/2019)   . trolamine salicylate (ASPERCREME) 10 % cream Apply 1 application topically daily as needed for muscle pain.    No  facility-administered encounter medications on file as of 03/27/2019.    Functional Status:  In your present state of health, do you have any difficulty performing the following activities: 01/14/2019  Vision? N  Difficulty concentrating or making decisions? N  Walking or climbing stairs? N  Dressing or bathing? N  Doing errands, shopping? N  Preparing Food and eating ? N  Using the Toilet? N  In the past six months, have you accidently leaked urine? Y  Do you have problems with loss of bowel control? N  Managing your Medications? N  Managing your Finances? N  Some recent data might be hidden    Fall/Depression Screening: Fall Risk  03/27/2019 01/14/2019 01/31/2018  Falls in the past year? 0 0 0  Number falls in past yr: 0 0 -  Injury with Fall? 0 0 -  Risk for fall due to : Impaired balance/gait;Impaired mobility Impaired balance/gait -  Follow up Falls evaluation completed Falls evaluation completed -   PHQ 2/9 Scores 01/14/2019 11/21/2014  PHQ - 2 Score 0 0   THN CM Care Plan Problem One     Most Recent Value  Care Plan Problem One  knowledge Deficit in Self Management of Hypertension  Role Documenting the Problem One  [place] for Problem One  Active  THN Long Term Goal   Patient will have a better knowledge of how to control blood pressure within the next 90 days  Interventions for Problem One Long Term Goal  RN reiterates checking blood pressure. RN discussed adhearing to low sodium diet. RN will follow up for further discussion  THN CM Short Term Goal #1   Patient will have a better understanding of foods low in sodium within the next 30 days  THN CM Short Term Goal #1 Start Date  03/27/19  Interventions for Short Term Goal #1  RN reiterated low sodium foods and discussed patient choices. N sent educational material on low sodium diet, sash diet and eating healthy. RN will follow up with further discussions  THN CM Short Term Goal #2   Patient will have a better  understanding of how to read food labels within the next 30 days  THN CM Short Term Goal #2 Start Date  03/27/19  Interventions for Short Term Goal #2  Patient is learning how to read and monitor the sodium in her diet. RN will continue to reiterate and follow.      Assessment:  Patient is learning to monitor her sodium intake Patient is monitoring her blood pressure and documenting Patient is taking medications as per ordered Patient does need additional education  On low sodium diet Patient will benefit from [facility] telephonic outreach for education and support for hypertension self management.  Plan: RN discussed reading the food labels and monitoring salt intake RN encourage continue to monitor and document blood pressure RN sent educational material on low sodium diet Patient sent educational material on constipation RN will follow up within the month of April  [name] [facility] [phone]

## 2019-04-04 DIAGNOSIS — E669 Obesity, unspecified: Secondary | ICD-10-CM | POA: Diagnosis not present

## 2019-04-04 DIAGNOSIS — E7800 Pure hypercholesterolemia, unspecified: Secondary | ICD-10-CM | POA: Diagnosis not present

## 2019-04-04 DIAGNOSIS — I517 Cardiomegaly: Secondary | ICD-10-CM | POA: Diagnosis not present

## 2019-04-04 DIAGNOSIS — M8580 Other specified disorders of bone density and structure, unspecified site: Secondary | ICD-10-CM | POA: Diagnosis not present

## 2019-04-04 DIAGNOSIS — F411 Generalized anxiety disorder: Secondary | ICD-10-CM | POA: Diagnosis not present

## 2019-04-04 DIAGNOSIS — I519 Heart disease, unspecified: Secondary | ICD-10-CM | POA: Diagnosis not present

## 2019-04-04 DIAGNOSIS — E559 Vitamin D deficiency, unspecified: Secondary | ICD-10-CM | POA: Diagnosis not present

## 2019-04-04 DIAGNOSIS — I639 Cerebral infarction, unspecified: Secondary | ICD-10-CM | POA: Diagnosis not present

## 2019-04-04 DIAGNOSIS — I10 Essential (primary) hypertension: Secondary | ICD-10-CM | POA: Diagnosis not present

## 2019-04-04 DIAGNOSIS — Z0000 Encounter for general adult medical examination without abnormal findings: Secondary | ICD-10-CM | POA: Diagnosis not present

## 2019-04-04 DIAGNOSIS — D329 Benign neoplasm of meninges, unspecified: Secondary | ICD-10-CM | POA: Diagnosis not present

## 2019-04-04 DIAGNOSIS — E1169 Type 2 diabetes mellitus with other specified complication: Secondary | ICD-10-CM | POA: Diagnosis not present

## 2019-04-09 ENCOUNTER — Other Ambulatory Visit: Payer: Self-pay | Admitting: Family Medicine

## 2019-04-09 DIAGNOSIS — M8580 Other specified disorders of bone density and structure, unspecified site: Secondary | ICD-10-CM

## 2019-04-15 DIAGNOSIS — E7800 Pure hypercholesterolemia, unspecified: Secondary | ICD-10-CM | POA: Diagnosis not present

## 2019-04-15 DIAGNOSIS — E559 Vitamin D deficiency, unspecified: Secondary | ICD-10-CM | POA: Diagnosis not present

## 2019-04-15 DIAGNOSIS — E1169 Type 2 diabetes mellitus with other specified complication: Secondary | ICD-10-CM | POA: Diagnosis not present

## 2019-04-15 DIAGNOSIS — I10 Essential (primary) hypertension: Secondary | ICD-10-CM | POA: Diagnosis not present

## 2019-04-19 ENCOUNTER — Encounter: Payer: Self-pay | Admitting: Adult Health

## 2019-04-28 ENCOUNTER — Ambulatory Visit: Payer: Medicare Other | Attending: Internal Medicine

## 2019-04-28 DIAGNOSIS — Z23 Encounter for immunization: Secondary | ICD-10-CM | POA: Insufficient documentation

## 2019-04-28 NOTE — Progress Notes (Signed)
   Covid-19 Vaccination Clinic  Name:  Vanelope Euceda    MRN: [id number] DOB: June 04, 1950  04/28/2019  Ms. Rayon was observed post Covid-19 immunization for 15 minutes without incidence. She was provided with Vaccine Information Sheet and instruction to access the V-Safe system.   Ms. Fitzke was instructed to call 911 with any severe reactions post vaccine: Marland Kitchen Difficulty breathing  . Swelling of your face and throat  . A fast heartbeat  . A bad rash all over your body  . Dizziness and weakness    Immunizations Administered    Name Date Dose VIS Date Route   Pfizer COVID-19 Vaccine 04/28/2019 10:38 AM 0.3 mL 02/22/2019 Intramuscular   Manufacturer: [place]   Lot: [id number]   [place]: [id number]

## 2019-05-02 ENCOUNTER — Ambulatory Visit
Admission: RE | Admit: 2019-05-02 | Discharge: 2019-05-02 | Disposition: A | Discharge status: Home / Self Care | Payer: Medicare Other | Source: Ambulatory Visit | Attending: Family Medicine | Admitting: Family Medicine

## 2019-05-02 ENCOUNTER — Other Ambulatory Visit: Payer: Self-pay

## 2019-05-02 DIAGNOSIS — Z1231 Encounter for screening mammogram for malignant neoplasm of breast: Secondary | ICD-10-CM | POA: Diagnosis not present

## 2019-05-21 ENCOUNTER — Ambulatory Visit: Payer: Medicare Other | Attending: Internal Medicine

## 2019-05-21 DIAGNOSIS — Z23 Encounter for immunization: Secondary | ICD-10-CM | POA: Insufficient documentation

## 2019-05-21 NOTE — Progress Notes (Signed)
   Covid-19 Vaccination Clinic  Name:  Christine Navarro    MRN: [id number] DOB: [birth year]  05/21/2019  [name] was observed post Covid-19 immunization for 15 minutes without incident. She was provided with Vaccine Information Sheet and instruction to access the V-Safe system.   [name] was instructed to call 911 with any severe reactions post vaccine: Marland Kitchen Difficulty breathing  . Swelling of face and throat  . A fast heartbeat  . A bad rash all over body  . Dizziness and weakness   Immunizations Administered    Name Date Dose VIS Date Route   Pfizer COVID-19 Vaccine 05/21/2019 12:25 PM 0.3 mL 02/22/2019 Intramuscular   Manufacturer: [place]   Lot: [id number]   [place]: [id number]

## 2019-06-21 ENCOUNTER — Ambulatory Visit
Admission: RE | Admit: 2019-06-21 | Discharge: 2019-06-21 | Disposition: A | Discharge status: Home / Self Care | Payer: Medicare Other | Source: Ambulatory Visit | Attending: Family Medicine | Admitting: Family Medicine

## 2019-06-21 ENCOUNTER — Other Ambulatory Visit: Payer: Self-pay

## 2019-06-21 DIAGNOSIS — M85852 Other specified disorders of bone density and structure, left thigh: Secondary | ICD-10-CM | POA: Diagnosis not present

## 2019-06-21 DIAGNOSIS — Z780 Asymptomatic menopausal state: Secondary | ICD-10-CM | POA: Diagnosis not present

## 2019-06-21 DIAGNOSIS — M8580 Other specified disorders of bone density and structure, unspecified site: Secondary | ICD-10-CM

## 2019-06-26 ENCOUNTER — Other Ambulatory Visit: Payer: Self-pay | Admitting: *Deleted

## 2019-06-26 NOTE — Patient Outreach (Signed)
[place] [facility]) Care Management  [place]  06/26/2019   Christine Navarro 1950-06-03 [id number]  [place] telephone call to patient.  Hipaa compliance verified. Per patient she is doing good. Per patient her blood pressure is doing much  better. BP today is 141/71. Per patient she has been trying to follow up with her Health Maintenance. Patient has received her COVID vaccine. Patient has started walking with a friend and increasing distance each day. Per patient her stress level is so much better and this has helped her blood pressure. Patient stated she passed a kidney stone last week. Patient has agreed to further outreach calls.   Encounter Medications:  Outpatient Encounter Medications as of 06/26/2019  Medication Sig Note  . clopidogrel (PLAVIX) 75 MG tablet Take 1 tablet (75 mg total) by mouth daily.   . hydrochlorothiazide (HYDRODIURIL) 25 MG tablet Take 1 tablet by mouth as needed for fluid.   Marland Kitchen irbesartan (AVAPRO) 300 MG tablet Take 300 mg by mouth daily.   Marland Kitchen LORazepam (ATIVAN) 1 MG tablet Take 0.5 mg by mouth 2 (two) times daily as needed for anxiety or sleep.    . metFORMIN (GLUCOPHAGE) 500 MG tablet Take 500 mg by mouth 2 (two) times daily.   . metoprolol tartrate (LOPRESSOR) 25 MG tablet Take 25 mg by mouth 2 (two) times daily.    Marland Kitchen oxybutynin (DITROPAN-XL) 5 MG 24 hr tablet Take 5 mg by mouth daily.   Marland Kitchen oxymetazoline (AFRIN) 0.05 % nasal spray Place 1 spray into both nostrils daily as needed for congestion.   . ranitidine (ZANTAC) 150 MG tablet Take 150 mg by mouth daily. 01/14/2019: Patient takes prevacid  . rosuvastatin (CRESTOR) 40 MG tablet Take 40 mg by mouth daily.   Marland Kitchen senna-docusate (SENOKOT-S) 8.6-50 MG tablet Take 1 tablet by mouth 2 (two) times daily. While taking pain meds to prevent constipation. (Patient not taking: Reported on 01/14/2019)   . trolamine salicylate (ASPERCREME) 10 % cream Apply 1 application topically daily as needed  for muscle pain.    No facility-administered encounter medications on file as of 06/26/2019.    Functional Status:  In your present state of health, do you have any difficulty performing the following activities: 01/14/2019  Vision? N  Difficulty concentrating or making decisions? N  Walking or climbing stairs? N  Dressing or bathing? N  Doing errands, shopping? N  Preparing Food and eating ? N  Using the Toilet? N  In the past six months, have you accidently leaked urine? Y  Do you have problems with loss of bowel control? N  Managing your Medications? N  Managing your Finances? N  Some recent data might be hidden    Fall/Depression Screening: Fall Risk  03/27/2019 01/14/2019 01/31/2018  Falls in the past year? 0 0 0  Number falls in past yr: 0 0 -  Injury with Fall? 0 0 -  Risk for fall due to : Impaired balance/gait;Impaired mobility Impaired balance/gait -  Follow up Falls evaluation completed Falls evaluation completed -   PHQ 2/9 Scores 01/14/2019 11/21/2014  PHQ - 2 Score 0 0   THN CM Care Plan Problem One     Most Recent Value  Care Plan Problem One  Knowledge Deficit in Self Management of Hypertension  Role Documenting the Problem One  [place] for Problem One  Active  THN Long Term Goal   Patient will have a better knowledge of how to control  blood pressure within the next 90 days  Interventions for Problem One Long Term Goal  RN reiterates monitoring blood pressure. RN reiterates monitoring sodium. RN will follow up with further discussion.  THN CM Short Term Goal #1   Patient will have a better understanding of foods low in sodium within the next 30 days  THN CM Short Term Goal #1 Met Date  06/26/19  [facility] CM Short Term Goal #2   Patient will have a better understanding of how to read food labels within the next 30 days  Interventions for Short Term Goal #2  RN reiterates with patient to read the labels and monotor the sodium. RN will follow up with further  discussion      Assessment: Patient is monitoring sodium intake Patient is monitoring blood pressure Patient is taking medications as prescribed Patient has started an exercise routine of walking Patient has received COVID vaccines Plan: RN reiterated importance of monitoring sodium RN discussed stress factors with patient RN reiterated Health Maintenance RN discussed drinking water and adding fiber to decrease constipation RN will follow up within the month of July  Christine Navarro [place] [facility] [phone]

## 2019-07-09 DIAGNOSIS — G4733 Obstructive sleep apnea (adult) (pediatric): Secondary | ICD-10-CM | POA: Diagnosis not present

## 2019-07-15 DIAGNOSIS — E782 Mixed hyperlipidemia: Secondary | ICD-10-CM | POA: Diagnosis not present

## 2019-07-15 DIAGNOSIS — E1169 Type 2 diabetes mellitus with other specified complication: Secondary | ICD-10-CM | POA: Diagnosis not present

## 2019-07-15 DIAGNOSIS — E559 Vitamin D deficiency, unspecified: Secondary | ICD-10-CM | POA: Diagnosis not present

## 2019-07-30 DIAGNOSIS — L304 Erythema intertrigo: Secondary | ICD-10-CM | POA: Diagnosis not present

## 2019-07-30 DIAGNOSIS — L570 Actinic keratosis: Secondary | ICD-10-CM | POA: Diagnosis not present

## 2019-07-30 DIAGNOSIS — X32XXXD Exposure to sunlight, subsequent encounter: Secondary | ICD-10-CM | POA: Diagnosis not present

## 2019-07-30 DIAGNOSIS — L308 Other specified dermatitis: Secondary | ICD-10-CM | POA: Diagnosis not present

## 2019-08-09 DIAGNOSIS — H40013 Open angle with borderline findings, low risk, bilateral: Secondary | ICD-10-CM | POA: Diagnosis not present

## 2019-09-24 ENCOUNTER — Other Ambulatory Visit: Payer: Self-pay | Admitting: *Deleted

## 2019-09-24 NOTE — Patient Outreach (Signed)
[place] [facility]) Care Management  09/24/2019  Christine Navarro 07-05-1950 [id number]  [place] attempted follow up outreach call to patient.  Per patient this is not a good time to talk with me.She is headed to the assisted living to see her 6.  Plan: RN will call patient again within 30 days.  [place] Care Management [phone]

## 2019-09-25 ENCOUNTER — Ambulatory Visit: Payer: Self-pay | Admitting: *Deleted

## 2019-10-02 ENCOUNTER — Other Ambulatory Visit: Payer: Self-pay | Admitting: Family Medicine

## 2019-10-02 DIAGNOSIS — I519 Heart disease, unspecified: Secondary | ICD-10-CM | POA: Diagnosis not present

## 2019-10-02 DIAGNOSIS — K219 Gastro-esophageal reflux disease without esophagitis: Secondary | ICD-10-CM | POA: Diagnosis not present

## 2019-10-02 DIAGNOSIS — I10 Essential (primary) hypertension: Secondary | ICD-10-CM | POA: Diagnosis not present

## 2019-10-02 DIAGNOSIS — N644 Mastodynia: Secondary | ICD-10-CM | POA: Diagnosis not present

## 2019-10-02 DIAGNOSIS — E669 Obesity, unspecified: Secondary | ICD-10-CM | POA: Diagnosis not present

## 2019-10-02 DIAGNOSIS — R809 Proteinuria, unspecified: Secondary | ICD-10-CM | POA: Diagnosis not present

## 2019-10-02 DIAGNOSIS — E782 Mixed hyperlipidemia: Secondary | ICD-10-CM | POA: Diagnosis not present

## 2019-10-02 DIAGNOSIS — E1169 Type 2 diabetes mellitus with other specified complication: Secondary | ICD-10-CM | POA: Diagnosis not present

## 2019-10-18 ENCOUNTER — Ambulatory Visit: Payer: Medicare Other

## 2019-10-18 ENCOUNTER — Other Ambulatory Visit: Payer: Self-pay

## 2019-10-18 ENCOUNTER — Ambulatory Visit
Admission: RE | Admit: 2019-10-18 | Discharge: 2019-10-18 | Disposition: A | Discharge status: Home / Self Care | Payer: Medicare Other | Source: Ambulatory Visit | Attending: Family Medicine | Admitting: Family Medicine

## 2019-10-18 DIAGNOSIS — N644 Mastodynia: Secondary | ICD-10-CM

## 2019-10-18 DIAGNOSIS — R928 Other abnormal and inconclusive findings on diagnostic imaging of breast: Secondary | ICD-10-CM | POA: Diagnosis not present

## 2019-10-25 ENCOUNTER — Other Ambulatory Visit: Payer: Self-pay | Admitting: *Deleted

## 2019-10-25 NOTE — Patient Outreach (Signed)
[place] [facility]) Care Management  [place]  10/25/2019   Christine Navarro [birth year] [id number]   [place] telephone call to patient.  Hipaa compliance verified. Per patient her blood pressure is doing better 143/69. Patient stated her stress level is so much better. She is adhering to diet. She is exercising daily walking the dogs. Patient has received COVID vaccine. Patient has agreed to follow up outreach calls.   Encounter Medications:  Outpatient Encounter Medications as of 10/25/2019  Medication Sig Note  . clopidogrel (PLAVIX) 75 MG tablet Take 1 tablet (75 mg total) by mouth daily.   . hydrochlorothiazide (HYDRODIURIL) 25 MG tablet Take 1 tablet by mouth as needed for fluid.   Marland Kitchen irbesartan (AVAPRO) 300 MG tablet Take 300 mg by mouth daily.   Marland Kitchen LORazepam (ATIVAN) 1 MG tablet Take 0.5 mg by mouth 2 (two) times daily as needed for anxiety or sleep.    . metFORMIN (GLUCOPHAGE) 500 MG tablet Take 500 mg by mouth 2 (two) times daily.   . metoprolol tartrate (LOPRESSOR) 25 MG tablet Take 25 mg by mouth 2 (two) times daily.    Marland Kitchen oxybutynin (DITROPAN-XL) 5 MG 24 hr tablet Take 5 mg by mouth daily.   Marland Kitchen oxymetazoline (AFRIN) 0.05 % nasal spray Place 1 spray into both nostrils daily as needed for congestion.   . ranitidine (ZANTAC) 150 MG tablet Take 150 mg by mouth daily. 01/14/2019: Patient takes prevacid  . rosuvastatin (CRESTOR) 40 MG tablet Take 40 mg by mouth daily.   Marland Kitchen senna-docusate (SENOKOT-S) 8.6-50 MG tablet Take 1 tablet by mouth 2 (two) times daily. While taking pain meds to prevent constipation. (Patient not taking: Reported on 01/14/2019)   . trolamine salicylate (ASPERCREME) 10 % cream Apply 1 application topically daily as needed for muscle pain.    No facility-administered encounter medications on file as of 10/25/2019.    Functional Status:  In your present state of health, do you have any difficulty performing the following activities:  01/14/2019  Vision? N  Difficulty concentrating or making decisions? N  Walking or climbing stairs? N  Dressing or bathing? N  Doing errands, shopping? N  Preparing Food and eating ? N  Using the Toilet? N  In the past six months, have you accidently leaked urine? Y  Do you have problems with loss of bowel control? N  Managing your Medications? N  Managing your Finances? N  Some recent data might be hidden    Fall/Depression Screening: Fall Risk  10/25/2019 03/27/2019 01/14/2019  Falls in the past year? 0 0 0  Number falls in past yr: 0 0 0  Injury with Fall? 0 0 0  Risk for fall due to : Impaired balance/gait;Impaired mobility Impaired balance/gait;Impaired mobility Impaired balance/gait  Follow up Falls evaluation completed Falls evaluation completed Falls evaluation completed   PHQ 2/9 Scores 01/14/2019 11/21/2014  PHQ - 2 Score 0 0   Goals Addressed            This Visit's Progress   . Blood Pressure < 140/90       CARE PLAN ENTRY (see longtitudinal plan of care for additional care plan information)  Objective:  . Last practice recorded BP readings:  BP Readings from Last 3 Encounters:  01/31/18 (!) 142/86  01/29/18 114/80  12/04/17 136/82 .   Marland Kitchen Most recent eGFR/CrCl: No results found for: EGFR  No components found for: CRCL  Current Barriers:  Marland Kitchen Knowledge deficit related to  self care management of hypertension  Case Manager Clinical Goal(s):  Marland Kitchen Over the next 90 days, patient will verbalize understanding of plan for hypertension management . Over the next 90 days, patient will attend all scheduled medical appointments: PCP . Over the next 90 days, patient will demonstrate improved adherence to prescribed treatment plan for hypertension as evidenced by taking all medications as prescribed, monitoring and recording blood pressure as directed, adhering to low sodium/DASH diet . Over the next 90 days, patient will demonstrate improved health management independence as  evidenced by checking blood pressure as directed and notifying PCP if SBP>180 or DBP > 110, taking all medications as prescribe, and adhering to a low sodium diet as discussed. . Over the next 90 days, patient will verbalize basic understanding of hypertension disease process and self health management plan as evidenced by blood pressure <140/90,medication adherence and dietary adherence    Interventions:  . Evaluation of current treatment plan related to hypertension self management and patient's adherence to plan as established by provider. . Discussed plans with patient for ongoing care management follow up and provided patient with direct contact information for care management team . Advised patient, providing education and rationale, to monitor blood pressure daily and record, calling PCP for findings outside established parameters.  . Reviewed scheduled/upcoming provider appointments including:  . Provided education regarding s/s of DASH diet/Low salt diet . Provided education regarding increasing physical activity . Provided educational material: Matter of Choice Blood Pressure Control . Provided educational material:  EMMI (Weight Loss Tips, Weight Loss Diet, Chair Exercises, Relaxation Techniques, Low Sodium Diet, DASH Diet, Controlling your Blood Pressure through Lifestyle, High Blood Pressure Health Problems, High Blood Pressure Taking Your Blood Pressure, Lowering Your Rick of High Blood Pressure, Stress, etc)  Patient Self Care Activities:  . Self administers medications as prescribed . Attends all scheduled provider appointments . Calls provider office for new concerns, questions, or BP outside discussed parameters . Monitors BP and records as discussed . Adheres to a low sodium diet/DASH diet . Increase physical activity as tolerated  Initial goal documentation         Assessment:  BP 143/69 No recent falls Adhering to diet Patient has an exercise routine Plan:   Patient will follow up with health maintenance Patient has received COVID vaccine Patient will continue to exercise and monitor heat index RN will follow up within the month of November  Christine Navarro [place] Care Management [phone]

## 2019-10-25 NOTE — Patient Outreach (Deleted)
[place]) Care Management  [place]  10/25/2019   Christine Navarro 06-21-1950 [id number]  [place] telephone call to patient.  Hipaa compliance verified. Per patient her blood pressure is doing better 143/69. Patient stated her stress level is so much better. She is adhering to diet. She is exercising daily walking the dogs. Patient has received COVID vaccine. Patient has agreed to follow up outreach calls.  Encounter Medications:  Outpatient Encounter Medications as of 09/24/2019  Medication Sig Note   clopidogrel (PLAVIX) 75 MG tablet Take 1 tablet (75 mg total) by mouth daily.    hydrochlorothiazide (HYDRODIURIL) 25 MG tablet Take 1 tablet by mouth as needed for fluid.    irbesartan (AVAPRO) 300 MG tablet Take 300 mg by mouth daily.    LORazepam (ATIVAN) 1 MG tablet Take 0.5 mg by mouth 2 (two) times daily as needed for anxiety or sleep.     metFORMIN (GLUCOPHAGE) 500 MG tablet Take 500 mg by mouth 2 (two) times daily.    metoprolol tartrate (LOPRESSOR) 25 MG tablet Take 25 mg by mouth 2 (two) times daily.     oxybutynin (DITROPAN-XL) 5 MG 24 hr tablet Take 5 mg by mouth daily.    oxymetazoline (AFRIN) 0.05 % nasal spray Place 1 spray into both nostrils daily as needed for congestion.    ranitidine (ZANTAC) 150 MG tablet Take 150 mg by mouth daily. 01/14/2019: Patient takes prevacid   rosuvastatin (CRESTOR) 40 MG tablet Take 40 mg by mouth daily.    senna-docusate (SENOKOT-S) 8.6-50 MG tablet Take 1 tablet by mouth 2 (two) times daily. While taking pain meds to prevent constipation. (Patient not taking: Reported on 01/14/2019)    trolamine salicylate (ASPERCREME) 10 % cream Apply 1 application topically daily as needed for muscle pain.    No facility-administered encounter medications on file as of 09/24/2019.    Functional Status:  In your present state of health, do you have any difficulty performing the following activities:  01/14/2019  Vision? N  Difficulty concentrating or making decisions? N  Walking or climbing stairs? N  Dressing or bathing? N  Doing errands, shopping? N  Preparing Food and eating ? N  Using the Toilet? N  In the past six months, have you accidently leaked urine? Y  Do you have problems with loss of bowel control? N  Managing your Medications? N  Managing your Finances? N  Some recent data might be hidden    Fall/Depression Screening: Fall Risk  10/25/2019 03/27/2019 01/14/2019  Falls in the past year? 0 0 0  Number falls in past yr: 0 0 0  Injury with Fall? 0 0 0  Risk for fall due to : Impaired balance/gait;Impaired mobility Impaired balance/gait;Impaired mobility Impaired balance/gait  Follow up Falls evaluation completed Falls evaluation completed Falls evaluation completed   PHQ 2/9 Scores 01/14/2019 11/21/2014  PHQ - 2 Score 0 0   Goals Addressed            This Visit's Progress    Blood Pressure < 140/90       CARE PLAN ENTRY (see longtitudinal plan of care for additional care plan information)  Objective:   Last practice recorded BP readings:  BP Readings from Last 3 Encounters:  01/31/18 (!) 142/86  01/29/18 114/80  12/04/17 136/82     Most recent eGFR/CrCl: No results found for: EGFR  No components found for: CRCL  Current Barriers:   Knowledge deficit related to self care  management of hypertension  Case Manager Clinical Goal(s):   Over the next 90 days, patient will verbalize understanding of plan for hypertension management  Over the next 90 days, patient will attend all scheduled medical appointments: PCP  Over the next 90 days, patient will demonstrate improved adherence to prescribed treatment plan for hypertension as evidenced by taking all medications as prescribed, monitoring and recording blood pressure as directed, adhering to low sodium/DASH diet  Over the next 90 days, patient will demonstrate improved health management independence as  evidenced by checking blood pressure as directed and notifying PCP if SBP>180 or DBP > 110, taking all medications as prescribe, and adhering to a low sodium diet as discussed.  Over the next 90 days, patient will verbalize basic understanding of hypertension disease process and self health management plan as evidenced by blood pressure <140/90,medication adherence and dietary adherence    Interventions:   Evaluation of current treatment plan related to hypertension self management and patient's adherence to plan as established by provider.  Discussed plans with patient for ongoing care management follow up and provided patient with direct contact information for care management team  Advised patient, providing education and rationale, to monitor blood pressure daily and record, calling PCP for findings outside established parameters.   Reviewed scheduled/upcoming provider appointments including:   Provided education regarding s/s of DASH diet/Low salt diet  Provided education regarding increasing physical activity  Provided educational material: Matter of Choice Blood Pressure Control  Provided educational material:  EMMI (Weight Loss Tips, Weight Loss Diet, Chair Exercises, Relaxation Techniques, Low Sodium Diet, DASH Diet, Controlling your Blood Pressure through Lifestyle, High Blood Pressure Health Problems, High Blood Pressure Taking Your Blood Pressure, Lowering Your Rick of High Blood Pressure, Stress, etc)  Patient Self Care Activities:   Self administers medications as prescribed  Attends all scheduled provider appointments  Calls provider office for new concerns, questions, or BP outside discussed parameters  Monitors BP and records as discussed  Adheres to a low sodium diet/DASH diet  Increase physical activity as tolerated  Initial goal documentation         Assessment:  BP 143/69 No recent falls Adhering to diet Patient has an exercise routine Plan:   Patient will follow up with health maintenance Patient has received COVID vaccine Patient will continue to exercise and monitor heat index RN will follow up within the month of November  Christine Navarro [place] Care Management [phone]

## 2020-01-09 DIAGNOSIS — E119 Type 2 diabetes mellitus without complications: Secondary | ICD-10-CM | POA: Diagnosis not present

## 2020-01-09 DIAGNOSIS — H40013 Open angle with borderline findings, low risk, bilateral: Secondary | ICD-10-CM | POA: Diagnosis not present

## 2020-01-26 ENCOUNTER — Ambulatory Visit: Payer: Medicare Other | Attending: Internal Medicine

## 2020-01-26 DIAGNOSIS — Z23 Encounter for immunization: Secondary | ICD-10-CM

## 2020-01-26 NOTE — Progress Notes (Signed)
   Covid-19 Vaccination Clinic  Name:  Christine Navarro    MRN: [id number] DOB: Nov 17, 1950  01/26/2020  [name] was observed post Covid-19 immunization for 15 minutes without incident. She was provided with Vaccine Information Sheet and instruction to access the V-Safe system.   [name] was instructed to call 911 with any severe reactions post vaccine: Marland Kitchen Difficulty breathing  . Swelling of face and throat  . A fast heartbeat  . A bad rash all over body  . Dizziness and weakness   Immunizations Administered    Name Date Dose VIS Date Route   Pfizer COVID-19 Vaccine 01/26/2020 10:01 AM 0.3 mL 01/01/2020 Intramuscular   Manufacturer: [place]   Lot: [id number]   [place]: [address]-8

## 2020-01-27 ENCOUNTER — Other Ambulatory Visit: Payer: Self-pay | Admitting: *Deleted

## 2020-01-27 NOTE — Patient Outreach (Signed)
[facility]) Care Management  [name]  01/27/2020   Christine Navarro 08-Sep-1950 [id number]  [place] telephone call to patient.  [employer] compliance verified. Per patient her bp last checked was 144/73. Patient stated her appetite is good. She has received her COVID booster. Patient is under stress with Aunt and daughter medical problems. Patient stated she is not having any pain. She is walking each day as weather permits. Patient has agreed to follow up outreach calls.  Encounter Medications:  Outpatient Encounter Medications as of 01/27/2020  Medication Sig Note  . clopidogrel (PLAVIX) 75 MG tablet Take 1 tablet (75 mg total) by mouth daily.   . hydrochlorothiazide (HYDRODIURIL) 25 MG tablet Take 1 tablet by mouth as needed for fluid.   Marland Kitchen irbesartan (AVAPRO) 300 MG tablet Take 300 mg by mouth daily.   Marland Kitchen LORazepam (ATIVAN) 1 MG tablet Take 0.5 mg by mouth 2 (two) times daily as needed for anxiety or sleep.    . metFORMIN (GLUCOPHAGE) 500 MG tablet Take 500 mg by mouth 2 (two) times daily.   . metoprolol tartrate (LOPRESSOR) 25 MG tablet Take 25 mg by mouth 2 (two) times daily.    Marland Kitchen oxybutynin (DITROPAN-XL) 5 MG 24 hr tablet Take 5 mg by mouth daily.   Marland Kitchen oxymetazoline (AFRIN) 0.05 % nasal spray Place 1 spray into both nostrils daily as needed for congestion.   . ranitidine (ZANTAC) 150 MG tablet Take 150 mg by mouth daily. 01/14/2019: Patient takes prevacid  . rosuvastatin (CRESTOR) 40 MG tablet Take 40 mg by mouth daily.   Marland Kitchen senna-docusate (SENOKOT-S) 8.6-50 MG tablet Take 1 tablet by mouth 2 (two) times daily. While taking pain meds to prevent constipation. (Patient not taking: Reported on 01/14/2019)   . trolamine salicylate (ASPERCREME) 10 % cream Apply 1 application topically daily as needed for muscle pain.    No facility-administered encounter medications on file as of 01/27/2020.    Functional Status:  No flowsheet data  found.  Fall/Depression Screening: Fall Risk  01/27/2020 10/25/2019 03/27/2019  Falls in the past year? 0 0 0  Number falls in past yr: 0 0 0  Injury with Fall? 0 0 0  Risk for fall due to : Impaired balance/gait;Impaired mobility Impaired balance/gait;Impaired mobility Impaired balance/gait;Impaired mobility  Follow up Falls evaluation completed Falls evaluation completed Falls evaluation completed   PHQ 2/9 Scores 01/27/2020 01/14/2019 11/21/2014  PHQ - 2 Score 0 0 0    Assessment:  Goals Addressed            This Visit's Progress   . (THN)Lifestyle Change       Follow Up Date [id number]   - agree on reward when goals are met - agree to work together to make changes - ask questions to understand - learn about high blood pressure    Why is this important?   The changes that you are asked to make may be hard to do.  This is especially true when the changes are life-long.  Knowing why it is important to you is the first step.  Working on the change with your family or support person helps you not feel alone.  Reward yourself and family or support person when goals are met. This can be an activity you choose like bowling, hiking, biking, swimming or shooting hoops.     Notes:     . [facility] and Keep All Appointments       Follow Up Date [id number]   -  call to cancel if needed - keep a calendar with prescription refill dates - keep a calendar with appointment dates    Why is this important?   Part of staying healthy is seeing the doctor for follow-up care.  If you forget your appointments, there are some things you can do to stay on track.    Notes:     . (THN)Track and Manage My Blood Pressure       Follow Up Date [id number]   - check blood pressure weekly - choose a place to take my blood pressure (home, clinic or office, retail store) - write blood pressure results in a log or diary    Why is this important?   You won't feel high blood pressure, but it can still hurt  your blood vessels.  High blood pressure can cause heart or kidney problems. It can also cause a stroke.  Making lifestyle changes like losing a little weight or eating less salt will help.  Checking your blood pressure at home and at different times of the day can help to control blood pressure.  If the doctor prescribes medicine remember to take it the way the doctor ordered.  Call the office if you cannot afford the medicine or if there are questions about it.     Notes:     . Blood Pressure < 140/90       CARE PLAN ENTRY (see longtitudinal plan of care for additional care plan information)  Objective:  . Last practice recorded BP readings:  BP Readings from Last 3 Encounters:  01/31/18 (!) 142/86  01/29/18 114/80  12/04/17 136/82 .   Marland Kitchen Most recent eGFR/CrCl: No results found for: EGFR  No components found for: CRCL  Current Barriers:  Marland Kitchen Knowledge deficit related to self care management of hypertension  Case Manager Clinical Goal(s):  Marland Kitchen Over the next 90 days, patient will verbalize understanding of plan for hypertension management . Over the next 90 days, patient will attend all scheduled medical appointments: PCP . Over the next 90 days, patient will demonstrate improved adherence to prescribed treatment plan for hypertension as evidenced by taking all medications as prescribed, monitoring and recording blood pressure as directed, adhering to low sodium/DASH diet . Over the next 90 days, patient will demonstrate improved health management independence as evidenced by checking blood pressure as directed and notifying PCP if SBP>180 or DBP > 110, taking all medications as prescribe, and adhering to a low sodium diet as discussed. . Over the next 90 days, patient will verbalize basic understanding of hypertension disease process and self health management plan as evidenced by blood pressure <140/90,medication adherence and dietary adherence    Interventions:  . Evaluation of current  treatment plan related to hypertension self management and patient's adherence to plan as established by provider. . Discussed plans with patient for ongoing care management follow up and provided patient with direct contact information for care management team . Advised patient, providing education and rationale, to monitor blood pressure daily and record, calling PCP for findings outside established parameters.  . Reviewed scheduled/upcoming provider appointments including:  . Provided education regarding s/s of DASH diet/Low salt diet . Provided education regarding increasing physical activity . Provided educational material: Matter of Choice Blood Pressure Control . Provided educational material:  EMMI (Weight Loss Tips, Weight Loss Diet, Chair Exercises, Relaxation Techniques, Low Sodium Diet, DASH Diet, Controlling your Blood Pressure through Lifestyle, High Blood Pressure Health Problems, High Blood Pressure Taking Your Blood  Pressure, Lowering Your Rick of High Blood Pressure, Stress, etc)  Patient Self Care Activities:  . Self administers medications as prescribed . Attends all scheduled provider appointments . Calls provider office for new concerns, questions, or BP outside discussed parameters . Monitors BP and records as discussed . Adheres to a low sodium diet/DASH diet . Increase physical activity as tolerated  BP 144/73       Plan:  Patient will follow up with Flu vaccine Patient will adhere to diet Patient will take medications as per ordered Patient will continue to exercise RN will follow up outreach within the month of February RN sent PCP update assessment  Frances Pleasant BSN RN [facility] Care Management [phone]  

## 2020-01-27 NOTE — Patient Instructions (Signed)
Goals Addressed            This Visit's Progress   . (THN)Lifestyle Change       Follow Up Date [id number]   - agree on reward when goals are met - agree to work together to make changes - ask questions to understand - learn about high blood pressure    Why is this important?   The changes that you are asked to make may be hard to do.  This is especially true when the changes are life-long.  Knowing why it is important to you is the first step.  Working on the change with your family or support person helps you not feel alone.  Reward yourself and family or support person when goals are met. This can be an activity you choose like bowling, hiking, biking, swimming or shooting hoops.     Notes:     . [facility] and Keep All Appointments       Follow Up Date [id number]   - call to cancel if needed - keep a calendar with prescription refill dates - keep a calendar with appointment dates    Why is this important?   Part of staying healthy is seeing the doctor for follow-up care.  If you forget your appointments, there are some things you can do to stay on track.    Notes:     . (THN)Track and Manage My Blood Pressure       Follow Up Date [id number]   - check blood pressure weekly - choose a place to take my blood pressure (home, clinic or office, retail store) - write blood pressure results in a log or diary    Why is this important?   You won't feel high blood pressure, but it can still hurt your blood vessels.  High blood pressure can cause heart or kidney problems. It can also cause a stroke.  Making lifestyle changes like losing a little weight or eating less salt will help.  Checking your blood pressure at home and at different times of the day can help to control blood pressure.  If the doctor prescribes medicine remember to take it the way the doctor ordered.  Call the office if you cannot afford the medicine or if there are questions about it.     Notes:     . Blood  Pressure < 140/90       CARE PLAN ENTRY (see longtitudinal plan of care for additional care plan information)  Objective:  . Last practice recorded BP readings:  BP Readings from Last 3 Encounters:  01/31/18 (!) 142/86  01/29/18 114/80  12/04/17 136/82 .   Marland Kitchen Most recent eGFR/CrCl: No results found for: EGFR  No components found for: CRCL  Current Barriers:  Marland Kitchen Knowledge deficit related to self care management of hypertension  Case Manager Clinical Goal(s):  Marland Kitchen Over the next 90 days, patient will verbalize understanding of plan for hypertension management . Over the next 90 days, patient will attend all scheduled medical appointments: PCP . Over the next 90 days, patient will demonstrate improved adherence to prescribed treatment plan for hypertension as evidenced by taking all medications as prescribed, monitoring and recording blood pressure as directed, adhering to low sodium/DASH diet . Over the next 90 days, patient will demonstrate improved health management independence as evidenced by checking blood pressure as directed and notifying PCP if SBP>180 or DBP > 110, taking all medications as prescribe, and adhering to a low sodium  diet as discussed. . Over the next 90 days, patient will verbalize basic understanding of hypertension disease process and self health management plan as evidenced by blood pressure <140/90,medication adherence and dietary adherence    Interventions:  . Evaluation of current treatment plan related to hypertension self management and patient's adherence to plan as established by provider. . Discussed plans with patient for ongoing care management follow up and provided patient with direct contact information for care management team . Advised patient, providing education and rationale, to monitor blood pressure daily and record, calling PCP for findings outside established parameters.  . Reviewed scheduled/upcoming provider appointments including:  . Provided  education regarding s/s of DASH diet/Low salt diet . Provided education regarding increasing physical activity . Provided educational material: Matter of Choice Blood Pressure Control . Provided educational material:  EMMI (Weight Loss Tips, Weight Loss Diet, Chair Exercises, Relaxation Techniques, Low Sodium Diet, DASH Diet, Controlling your Blood Pressure through Lifestyle, High Blood Pressure Health Problems, High Blood Pressure Taking Your Blood Pressure, Lowering Your Rick of High Blood Pressure, Stress, etc)  Patient Self Care Activities:  . Self administers medications as prescribed . Attends all scheduled provider appointments . Calls provider office for new concerns, questions, or BP outside discussed parameters . Monitors BP and records as discussed . Adheres to a low sodium diet/DASH diet . Increase physical activity as tolerated  BP 144/73

## 2020-03-03 DIAGNOSIS — N200 Calculus of kidney: Secondary | ICD-10-CM | POA: Diagnosis not present

## 2020-03-03 DIAGNOSIS — R34 Anuria and oliguria: Secondary | ICD-10-CM | POA: Diagnosis not present

## 2020-03-03 DIAGNOSIS — N281 Cyst of kidney, acquired: Secondary | ICD-10-CM | POA: Diagnosis not present

## 2020-03-17 IMAGING — MR MR HEAD W/O CM
10 of 11 series · 42 of 48 positions shown · non-contrast
Comparison: Prior CTA from 09/29/2017

CLINICAL DATA: Initial evaluation for possible stroke, focal neural
deficit.

EXAM:
MRI HEAD WITHOUT CONTRAST
TECHNIQUE: Multiplanar, multiecho pulse sequences of the brain and surrounding
structures were obtained without intravenous contrast.

[Series 5: ax dwi_tracew · axial · 3.0mm · 1.50mm/px · z∈[-72,+68]mm · 9 of 80 slices shown]
[im 1/80]
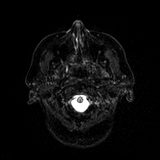
[im 10/80]
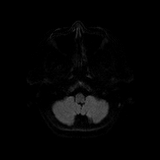
[im 20/80]
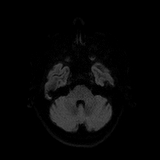
[im 30/80]
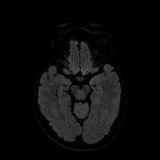
[im 40/80]
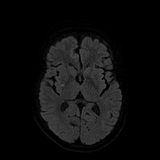
[im 50/80]
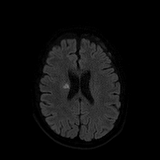
[im 60/80]
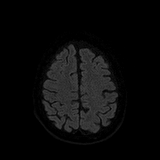
[im 70/80]
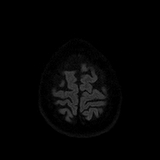
[im 80/80]
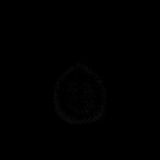

[Series 6: ax dwi_adc · axial · 3.0mm · 1.50mm/px · z∈[-72,+68]mm · 4 of 40 slices shown]
[im 1/40]
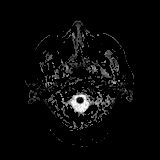
[im 14/40]
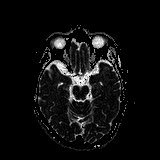
[im 27/40]
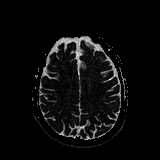
[im 40/40]
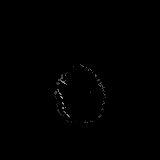

[Series 7: cor dwi_tracew · coronal · 5.0mm · 1.44mm/px · 6 of 64 slices shown]
[im 1/64]
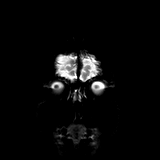
[im 13/64]
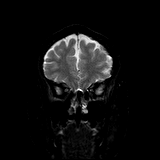
[im 26/64]
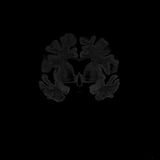
[im 38/64]
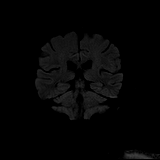
[im 51/64]
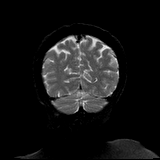
[im 64/64]
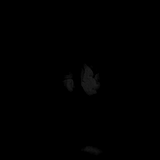

[Series 8: cor dwi_adc · coronal · 5.0mm · 1.44mm/px · 3 of 32 slices shown]
[im 1/32]
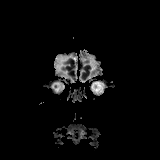
[im 16/32]
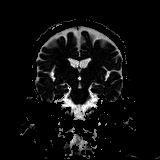
[im 32/32]
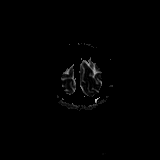

[Series 9: T1 · sagittal · 5.0mm · 0.75mm/px · 2 of 23 slices shown]
[im 1/23]
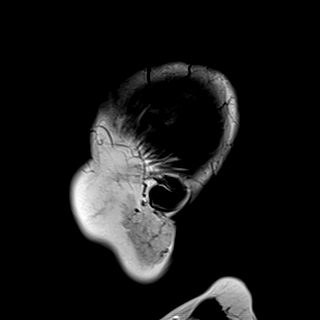
[im 23/23]
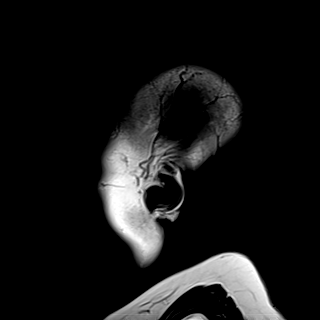

[Series 10: T2 · axial · 5.0mm · 0.72mm/px · z∈[-68,+76]mm · 2 of 25 slices shown (1 of 2)]
[im 1/25]
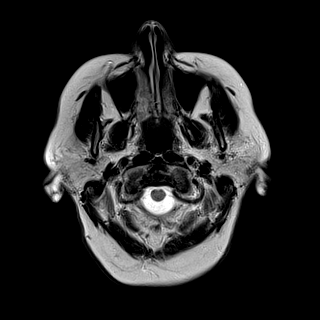
[im 25/25]
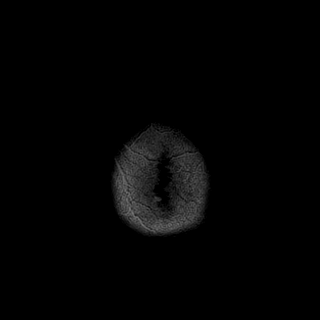

[Series 11: FLAIR · axial · 5.0mm · 0.45mm/px · z∈[-67,+76]mm · 2 of 25 slices shown]
[im 1/25]
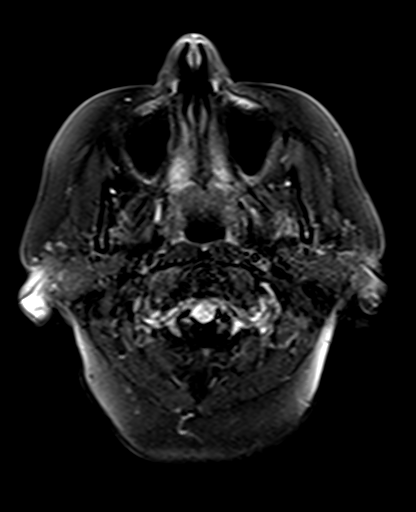
[im 25/25]
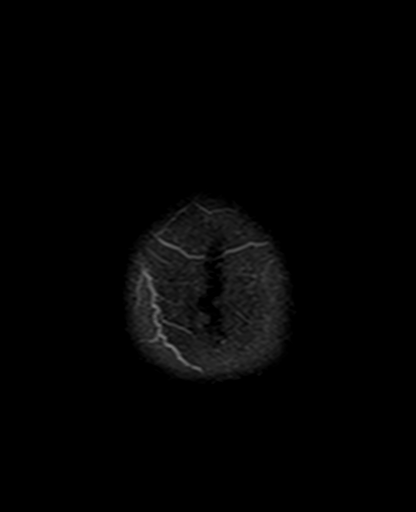

[Series 12: swi_images · axial · 3.0mm · 0.90mm/px · z∈[-85,+91]mm · 6 of 60 slices shown]
[im 1/60]
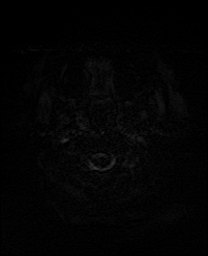
[im 12/60]
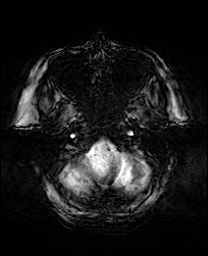
[im 24/60]
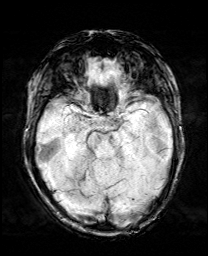
[im 36/60]
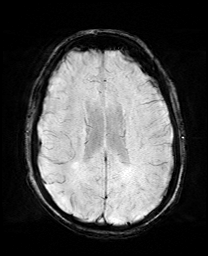
[im 48/60]
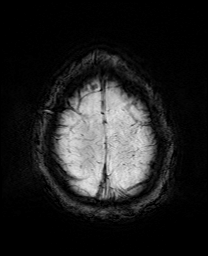
[im 60/60]
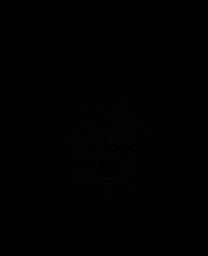

[Series 13: mip_images(sw) · axial · 24.0mm · 0.90mm/px · z∈[-75,+80]mm · 5 of 53 slices shown]
[im 1/53]
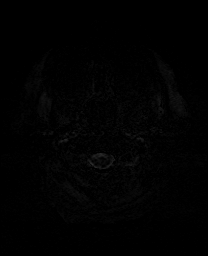
[im 14/53]
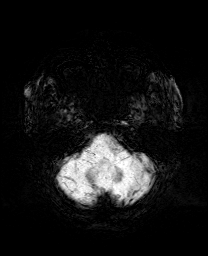
[im 27/53]
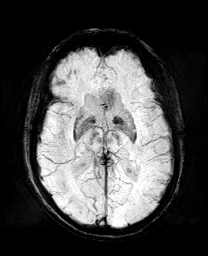
[im 40/53]
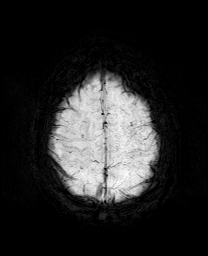
[im 53/53]
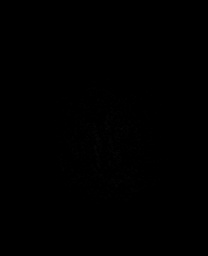

[Series 15: T2 · coronal · 5.0mm · 0.34mm/px · 3 of 29 slices shown (2 of 2)]
[im 1/29]
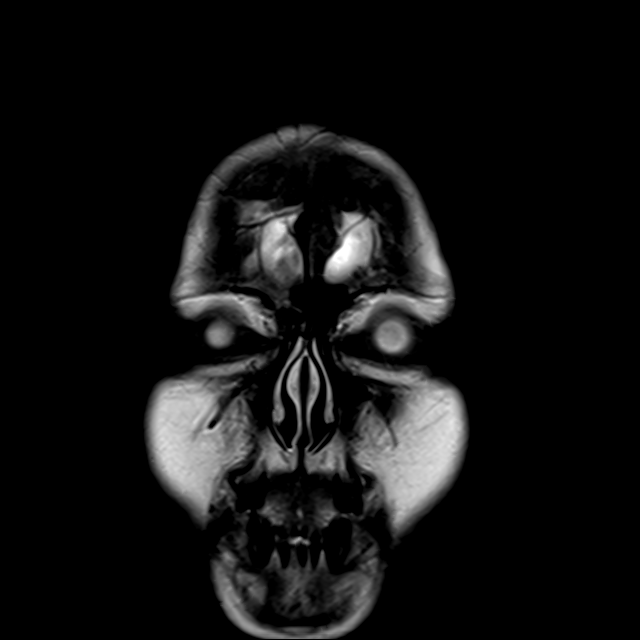
[im 15/29]
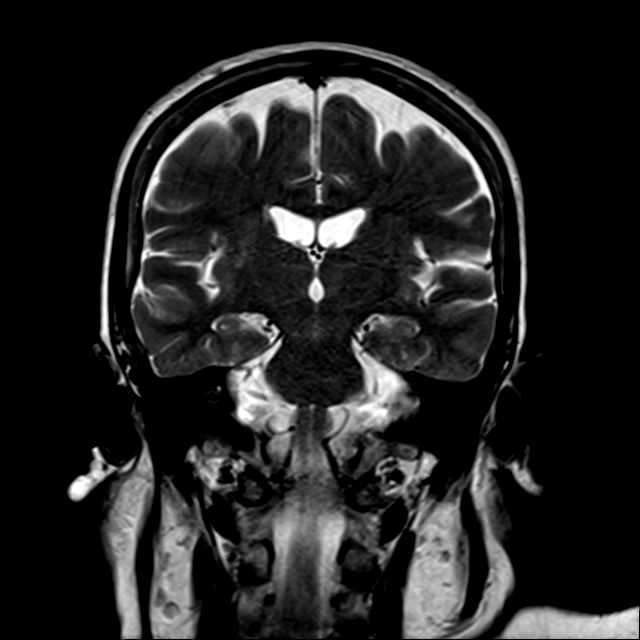
[im 29/29]
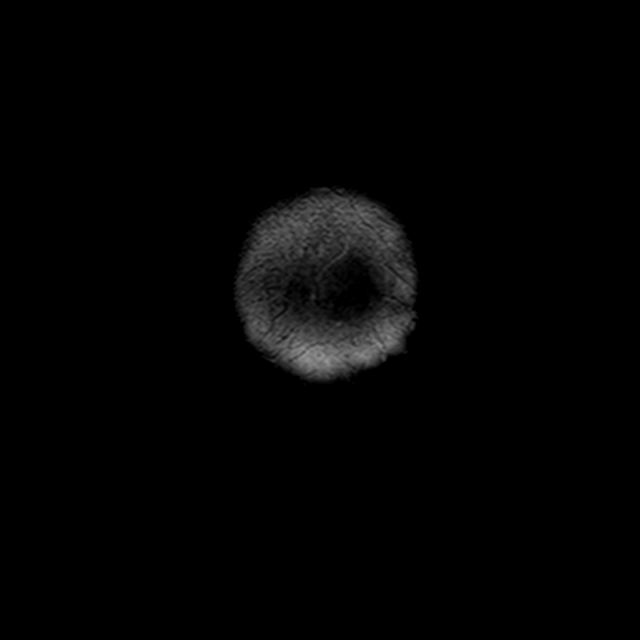

[42 of 48 positions shown; findings below may reference images not displayed]

FINDINGS: Brain: Cerebral volume within normal limits for age. Few scattered
T2/FLAIR hyperintense foci noted within the periventricular deep
white matter both cerebral hemispheres, nonspecific, but most like
related chronic small vessel ischemic change, felt to be within
normal limits for age.

Approximate 2.3 cm curvilinear acute ischemic infarct seen involving
the right basal ganglia, extending from the right lentiform nucleus
into the right caudate (series 7, image 49). No associated mass
effect or hemorrhage. No other evidence for acute or subacute
ischemia. Gray-white matter differentiation otherwise maintained. No
other areas of chronic infarction. No acute or chronic intracranial
hemorrhage.

13 mm extra-axial lesion overlying the left frontal convexity most
likely reflects a small meningioma (series 11, image 14). No
associated mass effect or edema. No other mass lesion. No midline
shift. No hydrocephalus. No extra-axial fluid collection. Pituitary
gland normal.

Vascular: Major intracranial vascular flow voids are maintained.

Skull and upper cervical spine: Craniocervical junction normal.
Cervical spondylolysis noted at C4-5 without significant stenosis.
Subcentimeter sclerotic lesion within the C3 vertebral body most
likely reflects a small bone island, better evaluated on prior CT.
No other focal marrow replacing lesion. Scalp soft tissues within
normal limits.

Sinuses/Orbits: Globes and orbital soft tissues demonstrate no acute
finding. Paranasal sinuses largely clear. No mastoid effusion. Inner
ear structures normal.

Other: None.
IMPRESSION: 1. 2.3 cm acute ischemic nonhemorrhagic right basal ganglia lacunar
infarct.
2. 13 mm meningioma overlying the left frontal convexity without
associated mass effect.
3. Otherwise normal brain MRI for age.

## 2020-04-22 ENCOUNTER — Other Ambulatory Visit: Payer: Self-pay | Admitting: *Deleted

## 2020-04-27 ENCOUNTER — Encounter: Payer: Self-pay | Admitting: *Deleted

## 2020-04-27 ENCOUNTER — Encounter: Payer: Self-pay | Admitting: Internal Medicine

## 2020-04-27 ENCOUNTER — Ambulatory Visit (INDEPENDENT_AMBULATORY_CARE_PROVIDER_SITE_OTHER): Payer: Medicare Other | Admitting: Internal Medicine

## 2020-04-27 ENCOUNTER — Other Ambulatory Visit: Payer: Self-pay

## 2020-04-27 ENCOUNTER — Ambulatory Visit (INDEPENDENT_AMBULATORY_CARE_PROVIDER_SITE_OTHER): Payer: Medicare Other

## 2020-04-27 VITALS — BP 168/86 | HR 76 | Ht 62.0 in | Wt 185.0 lb

## 2020-04-27 DIAGNOSIS — I10 Essential (primary) hypertension: Secondary | ICD-10-CM

## 2020-04-27 DIAGNOSIS — I6300 Cerebral infarction due to thrombosis of unspecified precerebral artery: Secondary | ICD-10-CM | POA: Diagnosis not present

## 2020-04-27 DIAGNOSIS — R002 Palpitations: Secondary | ICD-10-CM

## 2020-04-27 NOTE — Progress Notes (Signed)
Patient ID: Christine Navarro, female   DOB: 1950-10-15, 70 y.o.   MRN: [id number] Patient enrolled for [employer] to ship a 14 day ZIO XT long term holter monitor to her home.

## 2020-04-27 NOTE — Progress Notes (Signed)
PCP: Mayra Neer, MD   Primary EP: Dr Albin Felling Christine Navarro is a 70 y.o. female who presents today for routine electrophysiology followup.  Since last being seen in our clinic, the patient reports doing very well.  She has had palpitations which are concerning to her.  These occur several times per week, lasting only seconds.  She is very concerned.  She has not been able to capture them on her apple watch due to short duration.  Today, she denies symptoms of  chest pain, shortness of breath,  lower extremity edema, dizziness, presyncope, or syncope.  The patient is otherwise without complaint today.   Past Medical History:  Diagnosis Date  . Anxiety   . Arthritis   . Asthma   . Basal cell adenocarcinoma    nose  . Diabetes mellitus without complication ([place])   . History of kidney stones   . Hypertension   . PONV (postoperative nausea and vomiting)   . Sleep apnea    mild per pt-  no CPAP  . Stroke (cerebrum) [facility])    Past Surgical History:  Procedure Laterality Date  . ABDOMINAL HYSTERECTOMY    . APPENDECTOMY    . BREAST BIOPSY    . BREAST EXCISIONAL BIOPSY    . BREAST SURGERY     x 3  . CHOLECYSTECTOMY    . CYSTOSCOPY WITH RETROGRADE PYELOGRAM, URETEROSCOPY AND STENT PLACEMENT Right 12/23/2015   Procedure: CYSTOSCOPY WITH RETROGRADE PYELOGRAM, URETEROSCOPY AND STENT PLACEMENT;  Surgeon: Alexis Frock, MD;  Location: [facility] ORS;  Service: Urology;  Laterality: Right;  . DIAGNOSTIC LAPAROSCOPY    . HOLMIUM LASER APPLICATION Right 53/29/9242   Procedure: HOLMIUM LASER APPLICATION;  Surgeon: Alexis Frock, MD;  Location: [facility];  Service: Urology;  Laterality: Right;  . loop recoder remove    . LOOP RECORDER INSERTION N/A 11/02/2017   Procedure: LOOP RECORDER INSERTION;  Surgeon: Thompson Grayer, MD;  Location: [place] [facility] LAB;  Service: Cardiovascular;  Laterality: N/A;  . salpingectomy with oophorectomy    . TEE WITHOUT CARDIOVERSION N/A 11/02/2017   Procedure:  TRANSESOPHAGEAL ECHOCARDIOGRAM (TEE);  Surgeon: Fay Records, MD;  [facility] ENDOSCOPY;  Service: Cardiovascular;  Laterality: N/A;  loop recorder, bubble study    ROS- all systems are reviewed and negatives except as per HPI above  Current Outpatient Medications  Medication Sig Dispense Refill  . clopidogrel (PLAVIX) 75 MG tablet Take 1 tablet (75 mg total) by mouth daily. 30 tablet 0  . irbesartan (AVAPRO) 300 MG tablet Take 300 mg by mouth daily.    . lansoprazole (PREVACID) 15 MG capsule Take 15 mg by mouth daily at 12 noon.    Marland Kitchen LORazepam (ATIVAN) 1 MG tablet Take 0.5 mg by mouth 2 (two) times daily as needed for anxiety or sleep.     . metoprolol tartrate (LOPRESSOR) 25 MG tablet Take 25 mg by mouth 2 (two) times daily. Half in the morning and I tablet at bedtime    . oxybutynin (DITROPAN-XL) 5 MG 24 hr tablet Take 5 mg by mouth daily.    Marland Kitchen oxymetazoline (AFRIN) 0.05 % nasal spray Place 1 spray into both nostrils daily as needed for congestion.    . rosuvastatin (CRESTOR) 20 MG tablet Take 20 mg by mouth daily.    Marland Kitchen trolamine salicylate (ASPERCREME) 10 % cream Apply 1 application topically daily as needed for muscle pain.     No current facility-administered medications for this visit.    Physical  Exam: Vitals:   04/27/20 1119  BP: (!) 168/86  Pulse: 76  SpO2: 98%  Weight: 185 lb (83.9 kg)  Height: 5\' 2"  (1.575 m)    GEN- The patient is well appearing, alert and oriented x 3 today.   Head- normocephalic, atraumatic Eyes-  Sclera clear, conjunctiva pink Ears- hearing intact Oropharynx- clear Lungs- Clear to ausculation bilaterally, normal work of breathing Heart- Regular rate and rhythm, no murmurs, rubs or gallops, PMI not laterally displaced GI- soft, NT, ND, + BS Extremities- no clubbing, cyanosis, or edema  Wt Readings from Last 3 Encounters:  04/27/20 185 lb (83.9 kg)  01/31/18 187 lb (84.8 kg)  01/29/18 185 lb 3.2 oz (84 kg)    EKG tracing ordered today is  personally reviewed and shows sinus rhythm  Assessment and Plan:  1. Cryptogenic stroke No afib observed on ILR, though this was removed due to allergic skin reaction.  I previously advised apple watch to monitor going forward. She has not seen afib. We will place Zio monitor to evaluate her palpitations  2. HTN Elevated today She reports better control at home and does not wish to make changes today   Risks, benefits and potential toxicities for medications prescribed and/or refilled reviewed with patient today.   Return to follow-up on Zio results with EP PA in 6 weeks.  Thompson Grayer MD, [facility] 04/27/2020 11:25 AM

## 2020-04-27 NOTE — Patient Instructions (Signed)
Medication Instructions:  Your physician recommends that you continue on your current medications as directed. Please refer to the Current Medication list given to you today.  Labwork: None ordered.  Testing/Procedures: Your physician has recommended that you wear a heart monitor. Heart monitors are medical devices that record the heart's electrical activity. Doctors most often use these monitors to diagnose arrhythmias. Arrhythmias are problems with the speed or rhythm of the heartbeat. The monitor is a small, portable device. You can wear one while you do your normal daily activities. This is usually used to diagnose what is causing palpitations/syncope (passing out).   Follow-Up: Your physician wants you to follow-up in:   Any Other Special Instructions Will Be Listed Below (If Applicable).  If you need a refill on your cardiac medications before your next appointment, please call your pharmacy.   ZIO XT- Long Term Monitor Instructions   Your physician has requested you wear your ZIO patch monitor__14 __days.   This is a single patch monitor.  [employer] supplies one patch monitor per enrollment.  Additional stickers are not available.   Please do not apply patch if you will be having a Nuclear Stress Test, Echocardiogram, Cardiac CT, MRI, or Chest Xray during the time frame you would be wearing the monitor. The patch cannot be worn during these tests.  You cannot remove and re-apply the ZIO XT patch monitor.   Your ZIO patch monitor will be sent USPS Priority mail from [facility] directly to your home address. The monitor may also be mailed to a PO BOX if home delivery is not available.   It may take 3-5 days to receive your monitor after you have been enrolled.   Once you have received you monitor, please review enclosed instructions.  Your monitor has already been registered assigning a specific monitor serial # to you.   Applying the monitor   Shave hair from upper left  chest.   Hold abrader disc by orange tab.  Rub abrader in 40 strokes over left upper chest as indicated in your monitor instructions.   Clean area with 4 enclosed alcohol pads .  Use all pads to assure are is cleaned thoroughly.  Let dry.   Apply patch as indicated in monitor instructions.  Patch will be place under collarbone on left side of chest with arrow pointing upward.   Rub patch adhesive wings for 2 minutes.Remove white label marked "1".  Remove white label marked "2".  Rub patch adhesive wings for 2 additional minutes.   While looking in a mirror, press and release button in center of patch.  A small green light will flash 3-4 times .  This will be your only indicator the monitor has been turned on.     Do not shower for the first 24 hours.  You may shower after the first 24 hours.   Press button if you feel a symptom. You will hear a small click.  Record Date, Time and Symptom in the Patient Log Book.   When you are ready to remove patch, follow instructions on last 2 pages of Patient Log Book.  Stick patch monitor onto last page of Patient Log Book.   Place Patient Log Book in [place] box.  Use locking tab on box and tape box closed securely.  The [employer] and [employer] has [employer] on it.  Please place in mailbox as soon as possible.  Your physician should have your test results approximately 7 days after the monitor  has been mailed back to [place].   Call [place] at [phone] if you have questions regarding your ZIO XT patch monitor.  Call them immediately if you see an orange light blinking on your monitor.   If your monitor falls off in less than 4 days contact our Monitor department at [phone].  If your monitor becomes loose or falls off after 4 days call [employer] at [phone] for suggestions on securing your monitor.

## 2020-04-30 DIAGNOSIS — I10 Essential (primary) hypertension: Secondary | ICD-10-CM

## 2020-04-30 DIAGNOSIS — I6300 Cerebral infarction due to thrombosis of unspecified precerebral artery: Secondary | ICD-10-CM | POA: Diagnosis not present

## 2020-04-30 DIAGNOSIS — R002 Palpitations: Secondary | ICD-10-CM

## 2020-05-07 DIAGNOSIS — F411 Generalized anxiety disorder: Secondary | ICD-10-CM | POA: Diagnosis not present

## 2020-05-07 DIAGNOSIS — G4733 Obstructive sleep apnea (adult) (pediatric): Secondary | ICD-10-CM | POA: Diagnosis not present

## 2020-05-07 DIAGNOSIS — I10 Essential (primary) hypertension: Secondary | ICD-10-CM | POA: Diagnosis not present

## 2020-05-07 DIAGNOSIS — E559 Vitamin D deficiency, unspecified: Secondary | ICD-10-CM | POA: Diagnosis not present

## 2020-05-07 DIAGNOSIS — Z8673 Personal history of transient ischemic attack (TIA), and cerebral infarction without residual deficits: Secondary | ICD-10-CM | POA: Diagnosis not present

## 2020-05-07 DIAGNOSIS — I519 Heart disease, unspecified: Secondary | ICD-10-CM | POA: Diagnosis not present

## 2020-05-07 DIAGNOSIS — E1169 Type 2 diabetes mellitus with other specified complication: Secondary | ICD-10-CM | POA: Diagnosis not present

## 2020-05-07 DIAGNOSIS — I69320 Aphasia following cerebral infarction: Secondary | ICD-10-CM | POA: Diagnosis not present

## 2020-05-07 DIAGNOSIS — Z0000 Encounter for general adult medical examination without abnormal findings: Secondary | ICD-10-CM | POA: Diagnosis not present

## 2020-05-07 DIAGNOSIS — D329 Benign neoplasm of meninges, unspecified: Secondary | ICD-10-CM | POA: Diagnosis not present

## 2020-05-07 DIAGNOSIS — N3281 Overactive bladder: Secondary | ICD-10-CM | POA: Diagnosis not present

## 2020-05-07 DIAGNOSIS — E7800 Pure hypercholesterolemia, unspecified: Secondary | ICD-10-CM | POA: Diagnosis not present

## 2020-05-07 DIAGNOSIS — R319 Hematuria, unspecified: Secondary | ICD-10-CM | POA: Diagnosis not present

## 2020-05-07 DIAGNOSIS — I517 Cardiomegaly: Secondary | ICD-10-CM | POA: Diagnosis not present

## 2020-05-07 NOTE — Patient Outreach (Addendum)
[facility]) Care Management  [facility] Care Manager  [id number] Late entry  Christine Navarro 12/11/1950 [id number]  [place] telephone call to patient.  Hipaa compliance verified. Per patient her blood pressure has been a little elevated. 123/60-155/74. Patient has been under stress of dog dying, daughter illness and Aunt illness.  Her appetite has been good. She has not been able to go walking for exercise due to the temperature. Patient has been monitoring her blood pressure closely and documenting. She has been taking medications as prescribed. Patient has agreed to further outreach calls.    Encounter Medications:  Outpatient Encounter Medications as of 04/22/2020  Medication Sig  . clopidogrel (PLAVIX) 75 MG tablet Take 1 tablet (75 mg total) by mouth daily.  . irbesartan (AVAPRO) 300 MG tablet Take 300 mg by mouth daily.  Marland Kitchen LORazepam (ATIVAN) 1 MG tablet Take 0.5 mg by mouth 2 (two) times daily as needed for anxiety or sleep.   . metoprolol tartrate (LOPRESSOR) 25 MG tablet Take 25 mg by mouth 2 (two) times daily. Half in the morning and I tablet at bedtime  . oxybutynin (DITROPAN-XL) 5 MG 24 hr tablet Take 5 mg by mouth daily.  Marland Kitchen oxymetazoline (AFRIN) 0.05 % nasal spray Place 1 spray into both nostrils daily as needed for congestion.  . trolamine salicylate (ASPERCREME) 10 % cream Apply 1 application topically daily as needed for muscle pain.  . [DISCONTINUED] hydrochlorothiazide (HYDRODIURIL) 25 MG tablet Take 1 tablet by mouth as needed for fluid.  . [DISCONTINUED] metFORMIN (GLUCOPHAGE) 500 MG tablet Take 500 mg by mouth 2 (two) times daily.  . [DISCONTINUED] ranitidine (ZANTAC) 150 MG tablet Take 150 mg by mouth daily.  . [DISCONTINUED] rosuvastatin (CRESTOR) 40 MG tablet Take 40 mg by mouth daily. (Patient not taking: Reported on 04/27/2020)  . [DISCONTINUED] senna-docusate (SENOKOT-S) 8.6-50 MG tablet Take 1 tablet by mouth 2 (two) times daily. While taking  pain meds to prevent constipation. (Patient not taking: Reported on 01/14/2019)   No facility-administered encounter medications on file as of 04/22/2020.    Functional Status:  No flowsheet data found.  Fall/Depression Screening: Fall Risk  01/27/2020 10/25/2019 03/27/2019  Falls in the past year? 0 0 0  Number falls in past yr: 0 0 0  Injury with Fall? 0 0 0  Risk for fall due to : Impaired balance/gait;Impaired mobility Impaired balance/gait;Impaired mobility Impaired balance/gait;Impaired mobility  Follow up Falls evaluation completed Falls evaluation completed Falls evaluation completed   PHQ 2/9 Scores 01/27/2020 01/14/2019 11/21/2014  PHQ - 2 Score 0 0 0    Assessment:  Goals Addressed            This Visit's Progress   . (THN)Lifestyle Change   On track    Timeframe:  Long-Range Goal Priority:  Medium Start Date:    [id number]                         Expected End Date:    92010071          Follow Up Date [id number]   - agree on reward when goals are met - agree to work together to make changes - ask questions to understand - learn about high blood pressure    Why is this important?   The changes that you are asked to make may be hard to do.  This is especially true when the changes are life-long.  Knowing why it is important  to you is the first step.  Working on the change with your family or support person helps you not feel alone.  Reward yourself and family or support person when goals are met. This can be an activity you choose like bowling, hiking, biking, swimming or shooting hoops.     Notes:     . [facility] and Keep All Appointments   On track    Timeframe:  Long-Range Goal Priority:  Medium Start Date:       [id number]                      Expected End Date:       [id number]               Follow Up Date 59563875   - call to cancel if needed - keep a calendar with prescription refill dates - keep a calendar with appointment dates    Why is this important?    Part of staying healthy is seeing the doctor for follow-up care.  If you forget your appointments, there are some things you can do to stay on track.    Notes:     . (THN)Track and Manage My Blood Pressure   On track    Timeframe:  Long-Range Goal Priority:  High Start Date:   [id number]                          Expected End Date:   88416606                   Follow Up Date 30160109   - check blood pressure weekly - choose a place to take my blood pressure (home, clinic or office, retail store) - write blood pressure results in a log or diary    Why is this important?   You won't feel high blood pressure, but it can still hurt your blood vessels.  High blood pressure can cause heart or kidney problems. It can also cause a stroke.  Making lifestyle changes like losing a little weight or eating less salt will help.  Checking your blood pressure at home and at different times of the day can help to control blood pressure.  If the doctor prescribes medicine remember to take it the way the doctor ordered.  Call the office if you cannot afford the medicine or if there are questions about it.     Notes:        Plan:  Follow-up:  Patient agrees to Care Plan and Follow-up. F.A.S.T. acronym picture sheet for stroke provided Provided a Calendar book Patient will continue to monitor and document blood pressure readings RN will send update assessment to PCP RN will follow up outreach within the month of May  [facility] [place] Management [phone]

## 2020-05-07 NOTE — Patient Instructions (Signed)
Goals Addressed            This Visit's Progress   . (THN)Lifestyle Change   On track    Timeframe:  Long-Range Goal Priority:  Medium Start Date:    [id number]                         Expected End Date:    52841324          Follow Up Date 40102725   - agree on reward when goals are met - agree to work together to make changes - ask questions to understand - learn about high blood pressure    Why is this important?   The changes that you are asked to make may be hard to do.  This is especially true when the changes are life-long.  Knowing why it is important to you is the first step.  Working on the change with your family or support person helps you not feel alone.  Reward yourself and family or support person when goals are met. This can be an activity you choose like bowling, hiking, biking, swimming or shooting hoops.     Notes:     . [facility] and Keep All Appointments   On track    Timeframe:  Long-Range Goal Priority:  Medium Start Date:       36644034                      Expected End Date:       74259563               Follow Up Date 87564332   - call to cancel if needed - keep a calendar with prescription refill dates - keep a calendar with appointment dates    Why is this important?   Part of staying healthy is seeing the doctor for follow-up care.  If you forget your appointments, there are some things you can do to stay on track.    Notes:     . (THN)Track and Manage My Blood Pressure   On track    Timeframe:  Long-Range Goal Priority:  High Start Date:   95188416                          Expected End Date:   60630160                   Follow Up Date 10932355   - check blood pressure weekly - choose a place to take my blood pressure (home, clinic or office, retail store) - write blood pressure results in a log or diary    Why is this important?   You won't feel high blood pressure, but it can still hurt your blood vessels.  High blood pressure can  cause heart or kidney problems. It can also cause a stroke.  Making lifestyle changes like losing a little weight or eating less salt will help.  Checking your blood pressure at home and at different times of the day can help to control blood pressure.  If the doctor prescribes medicine remember to take it the way the doctor ordered.  Call the office if you cannot afford the medicine or if there are questions about it.     Notes:

## 2020-05-08 ENCOUNTER — Other Ambulatory Visit: Payer: Self-pay | Admitting: Family Medicine

## 2020-05-08 DIAGNOSIS — D329 Benign neoplasm of meninges, unspecified: Secondary | ICD-10-CM

## 2020-05-20 DIAGNOSIS — I10 Essential (primary) hypertension: Secondary | ICD-10-CM | POA: Diagnosis not present

## 2020-05-20 DIAGNOSIS — I6300 Cerebral infarction due to thrombosis of unspecified precerebral artery: Secondary | ICD-10-CM | POA: Diagnosis not present

## 2020-05-20 DIAGNOSIS — R002 Palpitations: Secondary | ICD-10-CM | POA: Diagnosis not present

## 2020-05-31 ENCOUNTER — Ambulatory Visit
Admission: RE | Admit: 2020-05-31 | Discharge: 2020-05-31 | Disposition: A | Discharge status: Home / Self Care | Payer: Medicare Other | Source: Ambulatory Visit | Attending: Family Medicine | Admitting: Family Medicine

## 2020-05-31 ENCOUNTER — Other Ambulatory Visit: Payer: Self-pay

## 2020-05-31 DIAGNOSIS — I639 Cerebral infarction, unspecified: Secondary | ICD-10-CM | POA: Diagnosis not present

## 2020-05-31 DIAGNOSIS — D329 Benign neoplasm of meninges, unspecified: Secondary | ICD-10-CM

## 2020-05-31 MED ORDER — GADOBENATE DIMEGLUMINE 529 MG/ML IV SOLN
15.0000 mL | Freq: Once | INTRAVENOUS | Status: AC | PRN
  Administered 2020-05-31: 15 mL via INTRAVENOUS

## 2020-06-08 ENCOUNTER — Ambulatory Visit: Payer: Medicare Other | Admitting: Student

## 2020-06-09 ENCOUNTER — Ambulatory Visit (INDEPENDENT_AMBULATORY_CARE_PROVIDER_SITE_OTHER): Payer: Medicare Other | Admitting: Student

## 2020-06-09 ENCOUNTER — Other Ambulatory Visit: Payer: Self-pay

## 2020-06-09 ENCOUNTER — Encounter: Payer: Self-pay | Admitting: Student

## 2020-06-09 VITALS — BP 146/88 | HR 69 | Ht 62.0 in | Wt 184.2 lb

## 2020-06-09 DIAGNOSIS — I6300 Cerebral infarction due to thrombosis of unspecified precerebral artery: Secondary | ICD-10-CM

## 2020-06-09 DIAGNOSIS — R002 Palpitations: Secondary | ICD-10-CM | POA: Diagnosis not present

## 2020-06-09 DIAGNOSIS — I10 Essential (primary) hypertension: Secondary | ICD-10-CM

## 2020-06-09 NOTE — Progress Notes (Signed)
PCP:  Mayra Neer, MD Primary Cardiologist: No primary care provider on file. Electrophysiologist: Thompson Grayer, MD   Christine Navarro is a 70 y.o. female seen today for Thompson Grayer, MD for routine electrophysiology followup s/p monitoring.  Since last being seen in our clinic the patient reports doing well. She has had less palpitations now that stressors have improved. BPs at home running 120-140s  she denies chest pain, dyspnea, PND, orthopnea, nausea, vomiting, dizziness, syncope, edema, weight gain, or early satiety.  Past Medical History:  Diagnosis Date  . Anxiety   . Arthritis   . Asthma   . Basal cell adenocarcinoma    nose  . Diabetes mellitus without complication ([place])   . History of kidney stones   . Hypertension   . PONV (postoperative nausea and vomiting)   . Sleep apnea    mild per pt-  no CPAP  . Stroke (cerebrum) [facility])    Past Surgical History:  Procedure Laterality Date  . ABDOMINAL HYSTERECTOMY    . APPENDECTOMY    . BREAST BIOPSY    . BREAST EXCISIONAL BIOPSY    . BREAST SURGERY     x 3  . CHOLECYSTECTOMY    . CYSTOSCOPY WITH RETROGRADE PYELOGRAM, URETEROSCOPY AND STENT PLACEMENT Right 12/23/2015   Procedure: CYSTOSCOPY WITH RETROGRADE PYELOGRAM, URETEROSCOPY AND STENT PLACEMENT;  Surgeon: Alexis Frock, MD;  Location: [facility] OR[facility];  Service: Urology;  Laterality: Right;  . DIAGNOSTIC LAPAROSCOPY    . HOLMIUM LASER APPLICATION Right 70/17/7939   Procedure: HOLMIUM LASER APPLICATION;  Surgeon: Alexis Frock, MD;  Location: [facility] ORS;  Service: Urology;  Laterality: Right;  . loop recoder remove    . LOOP RECORDER INSERTION N/A 11/02/2017   Procedure: LOOP RECORDER INSERTION;  Surgeon: Thompson Grayer, MD;  Location: [place] [facility];  Service: Cardiovascular;  Laterality: N/A;  . salpingectomy with oophorectomy    . TEE WITHOUT CARDIOVERSION N/A 11/02/2017   Procedure: TRANSESOPHAGEAL ECHOCARDIOGRAM (TEE);  Surgeon: Fay Records, MD;  Location: [facility]  ENDOSCOPY;  Service: Cardiovascular;  Laterality: N/A;  loop recorder, bubble study    Current Outpatient Medications  Medication Sig Dispense Refill  . clopidogrel (PLAVIX) 75 MG tablet Take 1 tablet (75 mg total) by mouth daily. 30 tablet 0  . irbesartan (AVAPRO) 300 MG tablet Take 300 mg by mouth daily.    . lansoprazole (PREVACID) 15 MG capsule Take 15 mg by mouth daily at 12 noon.    Marland Kitchen LORazepam (ATIVAN) 1 MG tablet Take 0.5 mg by mouth 2 (two) times daily as needed for anxiety or sleep.     . metoprolol tartrate (LOPRESSOR) 25 MG tablet Take 25 mg by mouth 2 (two) times daily. Half in the morning and I tablet at bedtime    . oxybutynin (DITROPAN-XL) 5 MG 24 hr tablet Take 5 mg by mouth daily.    Marland Kitchen oxymetazoline (AFRIN) 0.05 % nasal spray Place 1 spray into both nostrils daily as needed for congestion.    . rosuvastatin (CRESTOR) 20 MG tablet Take 20 mg by mouth daily.    Marland Kitchen trolamine salicylate (ASPERCREME) 10 % cream Apply 1 application topically daily as needed for muscle pain.     No current facility-administered medications for this visit.    Allergies  Allergen Reactions  . Ace Inhibitors Cough  . Ciprofloxacin Other (See Comments)    Muscle Cramps  . Sular [Nisoldipine Er] Other (See Comments)    HEART RACING  . Vitamin D Analogs Nausea And  Vomiting  . Welchol [Colesevelam Hcl] Nausea And Vomiting  . Erythromycin Nausea And Vomiting  . Nickel Itching, Dermatitis and Rash    Possible nickel allergy. Loop recorder explanted 11/30/17.  . Sulfa Antibiotics Rash  . Tape Rash    Use paper tape please    Social History   Socioeconomic History  . Marital status: Divorced    Spouse name: Not on file  . Number of children: Not on file  . Years of education: Not on file  . Highest education level: Not on file  Occupational History  . Not on file  Tobacco Use  . Smoking status: Never Smoker  . Smokeless tobacco: Never Used  Substance and Sexual Activity  . Alcohol use:  No  . Drug use: No  . Sexual activity: Not on file  Other Topics Concern  . Not on file  Social History Narrative  . Not on file   Social Determinants of Health   Financial Resource Strain: Not on file  Food Insecurity: No Food Insecurity  . Worried About Charity fundraiser in the Last Year: Never true  . Ran Out of Food in the Last Year: Never true  Transportation Needs: No Transportation Needs  . Lack of Transportation (Medical): No  . Lack of Transportation (Non-Medical): No  Physical Activity: Not on file  Stress: Not on file  Social Connections: Not on file  Intimate Partner Violence: Not on file     Review of Systems: General: No chills, fever, night sweats or weight changes  Cardiovascular:  No chest pain, dyspnea on exertion, edema, orthopnea, palpitations, paroxysmal nocturnal dyspnea Dermatological: No rash, lesions or masses Respiratory: No cough, dyspnea Urologic: No hematuria, dysuria Abdominal: No nausea, vomiting, diarrhea, bright red blood per rectum, melena, or hematemesis Neurologic: No visual changes, weakness, changes in mental status All other systems reviewed and are otherwise negative except as noted above.  Physical Exam: Vitals:   06/09/20 0936  BP: (!) 146/88  Pulse: 69  SpO2: 96%  Weight: 184 lb 3.2 oz (83.6 kg)  Height: 5\' 2"  (1.575 m)    GEN- The patient is well appearing, alert and oriented x 3 today.   HEENT: normocephalic, atraumatic; sclera clear, conjunctiva pink; hearing intact; oropharynx clear; neck supple, no JVP Lymph- no cervical lymphadenopathy Lungs- Clear to ausculation bilaterally, normal work of breathing.  No wheezes, rales, rhonchi Heart- Regular rate and rhythm, no murmurs, rubs or gallops, PMI not laterally displaced GI- soft, non-tender, non-distended, bowel sounds present, no hepatosplenomegaly Extremities- no clubbing, cyanosis, or edema; DP/PT/radial pulses 2+ bilaterally MS- no significant deformity or  atrophy Skin- warm and dry, no rash or lesion Psych- euthymic mood, full affect Neuro- strength and sensation are intact  EKG is not ordered.  Additional studies reviewed include: Previous EP office notes.   Monitor 05/20/2020  Sinus rhythm Occasional premature atrial contractions and premature ventricular contractions 2 episodes of nonsustained ventricular tachycardia are noted (longest 9 beats) 5 SVT episodes are noted (longest 7 beats) which appear to be consistent with atrial tachycardia No atrial fibrillation  Assessment and Plan:  1. Cryptogenic Stroke No AF observed for the brief period she tolerated ILR; ultimately removed due to allergic skin reaction.  No AF noted on apple watch or recent monitoring.  2. HTN Elevated today.  Declines medication adjustment or addition today. Encouraged weight loss and exercise.   3. Palpitations As above, monitoring noted PVCs, PACs, NSVT(longest 9 beats, normal EF, on BB), and SVT (AT; longest  7 beats).  These have improved with less personal stressors Declines offer to increase her lopressor. Prefers to continue watchful waiting which is appropriate.   Shirley Friar, PA-C  06/09/20 10:21 AM

## 2020-06-09 NOTE — Patient Instructions (Signed)
Medication Instructions:  Your physician recommends that you continue on your current medications as directed. Please refer to the Current Medication list given to you today.  *If you need a refill on your cardiac medications before your next appointment, please call your pharmacy*   Lab Work: None If you have labs (blood work) drawn today and your tests are completely normal, you will receive your results only by: Marland Kitchen MyChart Message (if you have MyChart) OR . A paper copy in the mail If you have any lab test that is abnormal or we need to change your treatment, we will call you to review the results.  Follow-Up: At [facility], you and your health needs are our priority.  As part of our continuing mission to provide you with exceptional heart care, we have created designated Provider Care Teams.  These Care Teams include your primary Cardiologist (physician) and Advanced Practice Providers (APPs -  Physician Assistants and Nurse Practitioners) who all work together to provide you with the care you need, when you need it.  Your next appointment:   6 month(s)  The format for your next appointment:   In Person  Provider:   You may see Thompson Grayer, MD or one of the following Advanced Practice Providers on your designated Care Team:    Chanetta Marshall, NP  Tommye Standard, PA-C  Legrand Como "Luther" Baldwin, [place]

## 2020-07-13 DIAGNOSIS — G4733 Obstructive sleep apnea (adult) (pediatric): Secondary | ICD-10-CM | POA: Diagnosis not present

## 2020-07-17 DIAGNOSIS — H40013 Open angle with borderline findings, low risk, bilateral: Secondary | ICD-10-CM | POA: Diagnosis not present

## 2020-07-21 ENCOUNTER — Other Ambulatory Visit: Payer: Self-pay | Admitting: *Deleted

## 2020-07-21 NOTE — Patient Outreach (Signed)
[facility]) Care Management  07/21/2020  Christine Navarro [birth year] [id number]   RN Health Coach telephone call to patient.  Hipaa compliance verified.  Per patient she had been up all night and had not been sleeping. She was just getting ready to lay down and requested a call back at a later time.   [place] Care Management [phone]

## 2020-08-17 ENCOUNTER — Other Ambulatory Visit: Payer: Self-pay | Admitting: *Deleted

## 2020-08-17 NOTE — Patient Outreach (Signed)
[facility]) Care Management  [place]  08/17/2020   Christine Navarro [birth year] [id number]  [place] telephone call to patient.  [employer] compliance verified. Per patient she has been monitoring her blood pressure. Patient blood pressure has been running from 164/72-141/68. She is monitoring her sodium. Patient stated that her BP monitoring is bruising her. Patient stated that she would like to have a wrist monitor. Patient has agreed to follow up outreach calls.    Encounter Medications:  Outpatient Encounter Medications as of 08/17/2020  Medication Sig  . clopidogrel (PLAVIX) 75 MG tablet Take 1 tablet (75 mg total) by mouth daily.  . irbesartan (AVAPRO) 300 MG tablet Take 300 mg by mouth daily.  . lansoprazole (PREVACID) 15 MG capsule Take 15 mg by mouth daily at 12 noon.  Marland Kitchen LORazepam (ATIVAN) 1 MG tablet Take 0.5 mg by mouth 2 (two) times daily as needed for anxiety or sleep.   . metoprolol tartrate (LOPRESSOR) 25 MG tablet Take 25 mg by mouth 2 (two) times daily. Half in the morning and I tablet at bedtime  . oxybutynin (DITROPAN-XL) 5 MG 24 hr tablet Take 5 mg by mouth daily.  Marland Kitchen oxymetazoline (AFRIN) 0.05 % nasal spray Place 1 spray into both nostrils daily as needed for congestion.  . rosuvastatin (CRESTOR) 20 MG tablet Take 20 mg by mouth daily.  Marland Kitchen trolamine salicylate (ASPERCREME) 10 % cream Apply 1 application topically daily as needed for muscle pain.   No facility-administered encounter medications on file as of 08/17/2020.    Functional Status:  No flowsheet data found.  Fall/Depression Screening: Fall Risk  08/17/2020 01/27/2020 10/25/2019  Falls in the past year? 0 0 0  Number falls in past yr: 0 0 0  Injury with Fall? 0 0 0  Risk for fall due to : Impaired balance/gait;Impaired mobility Impaired balance/gait;Impaired mobility Impaired balance/gait;Impaired mobility  Follow up Falls evaluation completed Falls evaluation completed Falls  evaluation completed   PHQ 2/9 Scores 08/17/2020 01/27/2020 01/14/2019 11/21/2014  PHQ - 2 Score 0 0 0 0    Assessment:   Care Plan Care Plan : General Plan of Care (Adult)  Updates made by Estha Few, Eppie Gibson, RN since 08/17/2020 12:00 AM    Problem: Health Promotion or Disease Self-Management (General Plan of Care)     Long-Range Goal: Self-Management Plan Developed   Start Date: 04/22/2020  Expected End Date: 03/12/2021  This Visit's Progress: On track  Priority: Medium  Note:   Evidence-based guidance:  Review biopsychosocial determinants of health screens.  Determine level of modifiable health risk.  Assess level of patient activation, level of readiness, importance and confidence to make changes.  Evoke change talk using open-ended questions, pros and cons, as well as looking forward.  Identify areas where behavior change may lead to improved health.  Partner with patient to develop a robust self-management plan that includes lifestyle factors, such as weight loss, exercise and healthy nutrition, as well as goals specific to disease risks.  Support patient and family/caregiver active participation in decision-making and self-management plan.  Implement additional goals and interventions based on identified risk factors to reduce health risk.  Facilitate advance care planning.  Review need for preventive screening based on age, sex, family history and health history.   Notes:    Task: Mutually Develop and Royce Macadamia Achievement of Patient Goals   Due Date: 03/12/2021  Note:   Care Management Activities:    - choices provided - decision-making supported -  health risks reviewed - questions answered - readiness for change evaluated - reassurance provided - self-reliance encouraged - verbalization of feelings encouraged    Notes:    Problem: Coping Skills (General Plan of Care)   Priority: Medium  Onset Date: 04/22/2020    Goal: Coping Skills Enhanced   Start Date: 01/27/2020   Expected End Date: 03/12/2021  This Visit's Progress: On track  Priority: Medium  Note:   Evidence-based guidance:  Acknowledge, normalize and validate difficulty of making life-long lifestyle changes.  Identify current effective and ineffective coping strategies.  Encourage patient and caregiver participation in care to increase self-esteem, confidence and feelings of control.  Consider alternative and complementary therapy approaches such as meditation, mindfulness or yoga.  Encourage participation in cognitive behavioral therapy to foster a positive identity, increase self-awareness, as well as bolster self-esteem, confidence and self-efficacy.  Discuss spirituality; be present as concerns are identified; encourage journaling, prayer, worship services, meditation or pastoral counseling.  Encourage participation in pleasurable group activities such as hobbies, singing, sports or volunteering).  Encourage the use of mindfulness; refer for training or intensive intervention.  Consider the use of meditative movement therapy such as tai chi, yoga or qigong.  Promote a regular daily exercise program based on tolerance, ability and patient choice to support positive thinking about disease or aging.   Notes:    Task: Support Psychosocial Response to Risk or Actual Health Condition   Due Date: 03/12/2021  Note:   Care Management Activities:    - active listening utilized - decision-making supported - healthy lifestyle promoted - relaxation techniques promoted - spiritual activities promoted - verbalization of feelings encouraged    Notes:    Problem: Quality of Life (General Plan of Care)   Priority: Medium  Onset Date: 04/22/2020    Long-Range Goal: Quality of Life Maintained   Note:   Evidence-based guidance:  Assess patient's thoughts about quality of life, goals and expectations, and dissatisfaction or desire to improve.  Identify issues of primary importance such as mental  health, illness, exercise tolerance, pain, sexual function and intimacy, cognitive change, social isolation, finances and relationships.  Assess and monitor for signs/symptoms of psychosocial concerns, especially depression or ideations regarding harm to others or self; provide or refer for mental health services as needed.  Identify sensory issues that impact quality of life such as hearing loss, vision deficit; strategize ways to maintain or improve hearing, vision.  Promote access to services in the community to support independence such as support groups, home visiting programs, financial assistance, handicapped parking tags, durable medical equipment and emergency responder.  Promote activities to decrease social isolation such as group support or social, leisure and recreational activities, employment, use of social media; consider safety concerns about being out of home for activities.  Provide patient an opportunity to share by storytelling or a "life review" to give positive meaning to life and to assist with coping and negative experiences.  Encourage patient to tap into hope to improve sense of self.  Counsel based on prognosis and as early as possible about end-of-life and palliative care; consider referral to palliative care provider.  Advocate for the development of palliative care plan that may include avoidance of unnecessary testing and intervention, symptom control, discontinuation of medications, hospice and organ donation.  Counsel as early as possible those with life-limiting chronic disease about palliative care; consider referral to palliative care provider.  Advocate for the development of palliative care plan.   Notes:    Task:  Support and Maintain Acceptable Degree of Health, Comfort and Happiness   Due Date: 03/12/2021  Note:   Care Management Activities:    - patient strengths promoted - self-expression encouraged - wellness behaviors promoted    Notes:    Care  Plan : Hypertension (Adult)  Updates made by Verlin Grills, RN since 08/17/2020 12:00 AM    Problem: Hypertension (Hypertension)   Priority: High  Onset Date: 04/22/2020    Long-Range Goal: Hypertension Monitored   Start Date: 04/22/2020  Expected End Date: 11/10/2020  Priority: High  Note:   Evidence-based guidance:  Promote initial use of ambulatory blood pressure measurements (for 3 days) to rule out "white-coat" effect; identify masked hypertension and presence or absence of nocturnal "dipping" of blood pressure.   Encourage continued use of home blood pressure monitoring and recording in blood pressure log; include symptoms of hypotension or potential medication side effects in log.  Review blood pressure measurements taken inside and outside of the provider office; establish baseline and monitor trends; compare to target ranges or patient goal.  Share overall cardiovascular risk with patient; encourage changes to lifestyle risk factors, including alcohol consumption, smoking, inadequate exercise, poor dietary habits and stress.   Notes:    Task: Identify and Monitor Blood Pressure Elevation   Due Date: 03/12/2021  Note:   Care Management Activities:    - home or ambulatory blood pressure monitoring encouraged    Notes:  [id number] Patient is taking her blood pressure daily   Problem: Disease Progression (Hypertension)   Priority: Medium  Onset Date: 04/22/2020    Long-Range Goal: Disease Progression Prevented or Minimized   Start Date: 04/22/2020  Expected End Date: 11/10/2020  Priority: Medium  Note:   Evidence-based guidance:  Tailor lifestyle advice to individual; review progress regularly; give frequent encouragement and respond positively to incremental successes.  Assess for and promote awareness of worsening disease or development of comorbidity.  Prepare patient for laboratory and diagnostic exams based on risk and presentation.  Prepare patient for use of pharmacologic  therapy that may include diuretic, beta-blocker, beta-blocker/thiazide combination, angiotensin-converting enzyme inhibitor, renin-angiotensin blocker or calcium-channel blocker.  Expect periodic adjustments to pharmacologic therapy; manage side effects.  Promote a healthy diet that includes primarily plant-based foods, such as fruits, vegetables, whole grains, beans and legumes, low-fat dairy and lean meats.   Consider moderate reduction in sodium intake by avoiding the addition of salt to prepared foods and limiting processed meats, canned soup, frozen meals and salty snacks.   Promote a regular, daily exercise goal of 150 minutes per week of moderate exercise based on tolerance, ability and patient choice; consider referral to physical therapist, community wellness and/or activity program.  Encourage the avoidance of no more than 2 hours per day of sedentary activity, such as recreational screen time.  Review sources of stress; explore current coping strategies and encourage use of mindfulness, yoga, meditation or exercise to manage stress.   Notes:    Task: Alleviate Barriers to Hypertension Treatment   Due Date: 03/12/2021  Note:   Care Management Activities:    - healthy diet promoted - patient response to treatment assessed - reduction in sedentary activities encouraged    Notes:    Problem: Resistant Hypertension (Hypertension)   Priority: Medium  Onset Date: 04/22/2020    Goal: Response to Treatment Maximized   Start Date: 04/22/2020  Expected End Date: 11/10/2020  Priority: Medium  Note:   Evidence-based guidance:  Assess patient response to treatment, including presence  or absence of medication side effects, degree of blood pressure control and patient satisfaction.  Assess technique (including cuff size and placement), measurement times, condition and calibration of blood pressure cuff set (both at-home and in-office equipment).  Assess factors that may influence response to  treatment, including nonadherence to pharmacologic treatment plan, diet or activity changes and/or presence of pain, stress or sleep disturbance.  Screen for signs and symptoms of depression; if present, refer for or complete a comprehensive assessment.  Evaluate social and economic barriers that may affect adherence to treatment plan  Address pharmacologic nonadherence by simplifying dosing regimen, counseling or support by pharmacist, financial assistance, self-monitoring of blood pressure, use of motivational interviewing, voice or text messages.  Encourage behavioral adherence strategies, like habit-based interventions that link medication taking with existing daily routines.  Assess barriers to regular, daily physical activity; support family or support person-oriented activity changes and utilization of community activity or sports program.  Address barriers to dietary changes, especially sodium restriction, with referrals to community programs, like cooking classes, meal services or intensive education when available.  Refer to community-based peer support program or nurse home-visiting program.  Assess for chronic pain; when present add additional goals (Chronic Pain Care Plan Guide) as needed.  Provide frequent follow-up by telephone, telemonitoring, patient-practice portal or with home visit.  Review alcohol use screen; address using brief intervention beginning with risk that interferes with blood pressure control; refer for treatment when excessive alcohol use is noted.  Screen for obstructive sleep apnea; prepare patient for polysomnography based on risk and presentation and use of noninvasive ventilation to relieve obstructive sleep apnea when present.   Notes:    Task: Facilitate Adherence to Lifestyle Change   Due Date: 03/12/2021  Note:   Care Management Activities:    - barriers to lifestyle change identified - medication adherence assessment completed - success praised -  support and encouragement provided    Notes:      Goals Addressed            This Visit's Progress   . ([facility])Lifestyle Change   On track    Timeframe:  Long-Range Goal Priority:  Medium Start Date:    [id number]                         Expected End Date:    [id number] Follow Up Date [id number] - agree on reward when goals are met - agree to work together to make changes - ask questions to understand - learn about high blood pressure    Why is this important?   The changes that you are asked to make may be hard to do.  This is especially true when the changes are life-long.  Knowing why it is important to you is the first step.  Working on the change with your family or support person helps you not feel alone.  Reward yourself and family or support person when goals are met. This can be an activity you choose like bowling, hiking, biking, swimming or shooting hoops.     Notes:     . [facility] and Keep All Appointments   On track    Timeframe:  Long-Range Goal Priority:  Medium Start Date:       [id number]                      Expected End Date:      [id number]  Follow Up Date [id number]   - call to cancel if needed - keep a calendar with prescription refill dates - keep a calendar with appointment dates    Why is this important?   Part of staying healthy is seeing the doctor for follow-up care.  If you forget your appointments, there are some things you can do to stay on track.    Notes:  [id number] Patient has kept her appointments    . ([facility]N)Track and Manage My Blood Pressure   On track    Timeframe:  Long-Range Goal Priority:  High Start Date:   [id number]                          Expected End Date:   [id number]             Follow Up Date [id number]   - check blood pressure weekly - choose a place to take my blood pressure (home, clinic or office, retail store) - write blood pressure results in a log or diary    Why is this important?   You won't feel high blood  pressure, but it can still hurt your blood vessels.  High blood pressure can cause heart or kidney problems. It can also cause a stroke.  Making lifestyle changes like losing a little weight or eating less salt will help.  Checking your blood pressure at home and at different times of the day can help to control blood pressure.  If the doctor prescribes medicine remember to take it the way the doctor ordered.  Call the office if you cannot afford the medicine or if there are questions about it.     Notes:  [id number] Patient has been monitoring blood pressure       Plan:  Follow-up: Patient agrees to Care Plan and Follow-up. RN sent patient a wrist BP monitor Patient will continue to monitor sodium in diet RN sent update assessment to PCP RN will follow up within the month of August  [facility] [phone]

## 2020-09-29 DIAGNOSIS — L918 Other hypertrophic disorders of the skin: Secondary | ICD-10-CM | POA: Diagnosis not present

## 2020-09-29 DIAGNOSIS — L720 Epidermal cyst: Secondary | ICD-10-CM | POA: Diagnosis not present

## 2020-09-29 DIAGNOSIS — X32XXXD Exposure to sunlight, subsequent encounter: Secondary | ICD-10-CM | POA: Diagnosis not present

## 2020-09-29 DIAGNOSIS — L570 Actinic keratosis: Secondary | ICD-10-CM | POA: Diagnosis not present

## 2020-11-05 DIAGNOSIS — K219 Gastro-esophageal reflux disease without esophagitis: Secondary | ICD-10-CM | POA: Diagnosis not present

## 2020-11-05 DIAGNOSIS — E1169 Type 2 diabetes mellitus with other specified complication: Secondary | ICD-10-CM | POA: Diagnosis not present

## 2020-11-05 DIAGNOSIS — I10 Essential (primary) hypertension: Secondary | ICD-10-CM | POA: Diagnosis not present

## 2020-11-05 DIAGNOSIS — E669 Obesity, unspecified: Secondary | ICD-10-CM | POA: Diagnosis not present

## 2020-11-05 DIAGNOSIS — E7800 Pure hypercholesterolemia, unspecified: Secondary | ICD-10-CM | POA: Diagnosis not present

## 2020-11-24 ENCOUNTER — Ambulatory Visit: Payer: Self-pay | Admitting: *Deleted

## 2020-11-27 ENCOUNTER — Other Ambulatory Visit: Payer: Self-pay | Admitting: *Deleted

## 2020-11-27 NOTE — Patient Outreach (Signed)
[facility]) Care Management  11/27/2020  Christine Navarro Oct 28, 1950 [id number]   [place] attempted follow up outreach call to patient.  Patient was unavailable. HIPPA compliance voicemail message left with return callback number.  Plan: RN will call patient again within 30 days.  [place] Care Management [phone]

## 2020-12-28 ENCOUNTER — Ambulatory Visit: Payer: Self-pay | Admitting: *Deleted

## 2021-01-04 ENCOUNTER — Other Ambulatory Visit: Payer: Self-pay | Admitting: *Deleted

## 2021-01-04 NOTE — Patient Outreach (Addendum)
[facility] [facility]) Care Management  [place]  01/04/2021   Christine Navarro December 04, 1950 [id number]  [place] telephone call to patient.  [employer] compliance verified. Per patient she has been monitoring her blood pressure 145/73-140/66. Patient stated that she is watching her sodium intake very closely. Patient is grilling foods and using insta pot. Per patient she is rarely fries any foods. Patient stated she hasn't walked much due to the  acorns falling and making it harder to walk. Patient has agreed to follow up outreach calls.   Encounter Medications:  Outpatient Encounter Medications as of 01/04/2021  Medication Sig   clopidogrel (PLAVIX) 75 MG tablet Take 1 tablet (75 mg total) by mouth daily.   irbesartan (AVAPRO) 300 MG tablet Take 300 mg by mouth daily.   lansoprazole (PREVACID) 15 MG capsule Take 15 mg by mouth daily at 12 noon.   LORazepam (ATIVAN) 1 MG tablet Take 0.5 mg by mouth 2 (two) times daily as needed for anxiety or sleep.    metoprolol tartrate (LOPRESSOR) 25 MG tablet Take 25 mg by mouth 2 (two) times daily. Half in the morning and I tablet at bedtime   oxybutynin (DITROPAN-XL) 5 MG 24 hr tablet Take 5 mg by mouth daily.   oxymetazoline (AFRIN) 0.05 % nasal spray Place 1 spray into both nostrils daily as needed for congestion.   rosuvastatin (CRESTOR) 20 MG tablet Take 20 mg by mouth daily.   trolamine salicylate (ASPERCREME) 10 % cream Apply 1 application topically daily as needed for muscle pain.   No facility-administered encounter medications on file as of 01/04/2021.    Functional Status:  No flowsheet data found.  Fall/Depression Screening: Fall Risk  01/04/2021 08/17/2020 01/27/2020  Falls in the past year? 0 0 0  Number falls in past yr: 0 0 0  Injury with Fall? 0 0 0  Risk for fall due to : Impaired balance/gait;Impaired mobility Impaired balance/gait;Impaired mobility Impaired balance/gait;Impaired mobility  Follow up  Falls evaluation completed Falls evaluation completed Falls evaluation completed   PHQ 2/9 Scores 08/17/2020 01/27/2020 01/14/2019 11/21/2014  PHQ - 2 Score 0 0 0 0      Care Plan Care Plan : General Plan of Care (Adult)  Updates made by Marcos Peloso, Eppie Gibson, RN since 01/04/2021 12:00 AM     Problem: Health Promotion or Disease Self-Management (General Plan of Care)   Priority: High     Problem: Coping Skills (General Plan of Care)   Priority: Medium  Onset Date: 04/22/2020     Goal: Coping Skills Enhanced   Start Date: 01/27/2020  Expected End Date: 04/12/2021  This Visit's Progress: On track  Recent Progress: On track  Priority: Medium  Note:   Evidence-based guidance:  Acknowledge, normalize and validate difficulty of making life-long lifestyle changes.  Identify current effective and ineffective coping strategies.  Encourage patient and caregiver participation in care to increase self-esteem, confidence and feelings of control.  Consider alternative and complementary therapy approaches such as meditation, mindfulness or yoga.  Encourage participation in cognitive behavioral therapy to foster a positive identity, increase self-awareness, as well as bolster self-esteem, confidence and self-efficacy.  Discuss spirituality; be present as concerns are identified; encourage journaling, prayer, worship services, meditation or pastoral counseling.  Encourage participation in pleasurable group activities such as hobbies, singing, sports or volunteering).  Encourage the use of mindfulness; refer for training or intensive intervention.  Consider the use of meditative movement therapy such as tai chi, yoga or qigong.  Promote a regular daily exercise program based on tolerance, ability and patient choice to support positive thinking about disease or aging.   Notes:  [id number] Patient walks when possible outside.    Task: Support Psychosocial Response to Risk or Actual Health Condition    Due Date: 04/12/2021  Note:   Care Management Activities:    - active listening utilized - decision-making supported - healthy lifestyle promoted - relaxation techniques promoted - spiritual activities promoted - verbalization of feelings encouraged    Notes:     Problem: Quality of Life (General Plan of Care)   Priority: Medium  Onset Date: 04/22/2020     Long-Range Goal: Quality of Life Maintained   Expected End Date: 04/12/2021  This Visit's Progress: On track  Note:   Evidence-based guidance:  Assess patient's thoughts about quality of life, goals and expectations, and dissatisfaction or desire to improve.  Identify issues of primary importance such as mental health, illness, exercise tolerance, pain, sexual function and intimacy, cognitive change, social isolation, finances and relationships.  Assess and monitor for signs/symptoms of psychosocial concerns, especially depression or ideations regarding harm to others or self; provide or refer for mental health services as needed.  Identify sensory issues that impact quality of life such as hearing loss, vision deficit; strategize ways to maintain or improve hearing, vision.  Promote access to services in the community to support independence such as support groups, home visiting programs, financial assistance, handicapped parking tags, durable medical equipment and emergency responder.  Promote activities to decrease social isolation such as group support or social, leisure and recreational activities, employment, use of social media; consider safety concerns about being out of home for activities.  Provide patient an opportunity to share by storytelling or a "life review" to give positive meaning to life and to assist with coping and negative experiences.  Encourage patient to tap into hope to improve sense of self.  Counsel based on prognosis and as early as possible about end-of-life and palliative care; consider referral to  palliative care provider.  Advocate for the development of palliative care plan that may include avoidance of unnecessary testing and intervention, symptom control, discontinuation of medications, hospice and organ donation.  Counsel as early as possible those with life-limiting chronic disease about palliative care; consider referral to palliative care provider.  Advocate for the development of palliative care plan.   Notes:     Task: Support and Maintain Acceptable Degree of Health, Comfort and Happiness   Due Date: 04/12/2021  Note:   Care Management Activities:    - patient strengths promoted - self-expression encouraged - wellness behaviors promoted    Notes:  [id number] Patient is following thru with health maintenance      Goals Addressed             This Visit's Progress    (THN)Lifestyle Change   On track    Timeframe:  Long-Range Goal Priority:  Medium Start Date:    [id number]                         Expected End Date:    [id number] Follow Up Date [id number] - agree on reward when goals are met - agree to work together to make changes - ask questions to understand - learn about high blood pressure    Why is this important?   The changes that you are asked to make may be hard to do.  This is especially true  when the changes are life-long.  Knowing why it is important to you is the first step.  Working on the change with your family or support person helps you not feel alone.  Reward yourself and family or support person when goals are met. This can be an activity you choose like bowling, hiking, biking, swimming or shooting hoops.     Notes:  [id number] Patient encouraged to walk and reward herself     [facility] and Keep All Appointments   On track    Timeframe:  Long-Range Goal Priority:  Medium Start Date:       [id number]                      Expected End Date:      53005110          Follow Up Date [id number]   - call to cancel if needed - keep a calendar with  prescription refill dates - keep a calendar with appointment dates    Why is this important?   Part of staying healthy is seeing the doctor for follow-up care.  If you forget your appointments, there are some things you can do to stay on track.    Notes:  [id number] Patient has kept her appointments [id number] Patient is making appointment for current symptoms.  [id number] Patient has scheduled health maintenance for [facility]     (THN)Track and Manage My Blood Pressure   On track    Timeframe:  Long-Range Goal Priority:  High Start Date:   06015615                          Expected End Date:   37943276 Follow Up Date [id number]   - check blood pressure weekly - choose a place to take my blood pressure (home, clinic or office, retail store) - write blood pressure results in a log or diary    Why is this important?   You won't feel high blood pressure, but it can still hurt your blood vessels.  High blood pressure can cause heart or kidney problems. It can also cause a stroke.  Making lifestyle changes like losing a little weight or eating less salt will help.  Checking your blood pressure at home and at different times of the day can help to control blood pressure.  If the doctor prescribes medicine remember to take it the way the doctor ordered.  Call the office if you cannot afford the medicine or if there are questions about it.     Notes:  [id number] Patient has been monitoring blood pressure daily  and documenting        Plan:  Follow-up: Follow-up in 3 month(s) Patient will follow up with PCP for current symptoms Patient will follow up with flu vaccine Patient will restart her routine exercise of walking RN sent update assessment to PCP Patient will follow up with her dental  [facility]  [employer] [facility] [phone]

## 2021-01-04 NOTE — Patient Instructions (Signed)
Goals Addressed             This Visit's Progress    (THN)Lifestyle Change   On track    Timeframe:  Long-Range Goal Priority:  Medium Start Date:    88891694                         Expected End Date:    50388828 Follow Up Date [id number] - agree on reward when goals are met - agree to work together to make changes - ask questions to understand - learn about high blood pressure    Why is this important?   The changes that you are asked to make may be hard to do.  This is especially true when the changes are life-long.  Knowing why it is important to you is the first step.  Working on the change with your family or support person helps you not feel alone.  Reward yourself and family or support person when goals are met. This can be an activity you choose like bowling, hiking, biking, swimming or shooting hoops.     Notes:  [id number] Patient encouraged to walk and reward herself     [facility] and Keep All Appointments   On track    Timeframe:  Long-Range Goal Priority:  Medium Start Date:       48016553                      Expected End Date:      74827078          Follow Up Date [id number]   - call to cancel if needed - keep a calendar with prescription refill dates - keep a calendar with appointment dates    Why is this important?   Part of staying healthy is seeing the doctor for follow-up care.  If you forget your appointments, there are some things you can do to stay on track.    Notes:  [id number] Patient has kept her appointments [id number] Patient is making appointment for current symptoms.  [id number] Patient has scheduled health maintenance for [facility]     (THN)Track and Manage My Blood Pressure   On track    Timeframe:  Long-Range Goal Priority:  High Start Date:   30940768                          Expected End Date:   08811031 Follow Up Date 59458592   - check blood pressure weekly - choose a place to take my blood pressure (home, clinic or office, retail  store) - write blood pressure results in a log or diary    Why is this important?   You won't feel high blood pressure, but it can still hurt your blood vessels.  High blood pressure can cause heart or kidney problems. It can also cause a stroke.  Making lifestyle changes like losing a little weight or eating less salt will help.  Checking your blood pressure at home and at different times of the day can help to control blood pressure.  If the doctor prescribes medicine remember to take it the way the doctor ordered.  Call the office if you cannot afford the medicine or if there are questions about it.     Notes:  [id number] Patient has been monitoring blood pressure daily  and documenting

## 2021-01-06 DIAGNOSIS — R42 Dizziness and giddiness: Secondary | ICD-10-CM | POA: Diagnosis not present

## 2021-01-06 DIAGNOSIS — L304 Erythema intertrigo: Secondary | ICD-10-CM | POA: Diagnosis not present

## 2021-01-13 DIAGNOSIS — Z23 Encounter for immunization: Secondary | ICD-10-CM | POA: Diagnosis not present

## 2021-01-17 DIAGNOSIS — Z20828 Contact with and (suspected) exposure to other viral communicable diseases: Secondary | ICD-10-CM | POA: Diagnosis not present

## 2021-01-19 DIAGNOSIS — E119 Type 2 diabetes mellitus without complications: Secondary | ICD-10-CM | POA: Diagnosis not present

## 2021-01-19 DIAGNOSIS — H40023 Open angle with borderline findings, high risk, bilateral: Secondary | ICD-10-CM | POA: Diagnosis not present

## 2021-02-15 DIAGNOSIS — N281 Cyst of kidney, acquired: Secondary | ICD-10-CM | POA: Diagnosis not present

## 2021-02-15 DIAGNOSIS — K5730 Diverticulosis of large intestine without perforation or abscess without bleeding: Secondary | ICD-10-CM | POA: Diagnosis not present

## 2021-02-15 DIAGNOSIS — K6389 Other specified diseases of intestine: Secondary | ICD-10-CM | POA: Diagnosis not present

## 2021-02-15 DIAGNOSIS — N200 Calculus of kidney: Secondary | ICD-10-CM | POA: Diagnosis not present

## 2021-02-15 DIAGNOSIS — K5792 Diverticulitis of intestine, part unspecified, without perforation or abscess without bleeding: Secondary | ICD-10-CM | POA: Diagnosis not present

## 2021-02-15 DIAGNOSIS — K3189 Other diseases of stomach and duodenum: Secondary | ICD-10-CM | POA: Diagnosis not present

## 2021-02-15 DIAGNOSIS — R34 Anuria and oliguria: Secondary | ICD-10-CM | POA: Diagnosis not present

## 2021-03-24 DIAGNOSIS — N3000 Acute cystitis without hematuria: Secondary | ICD-10-CM | POA: Diagnosis not present

## 2021-04-06 ENCOUNTER — Other Ambulatory Visit: Payer: Self-pay | Admitting: *Deleted

## 2021-04-06 NOTE — Patient Instructions (Signed)
Visit Information  Thank you for taking time to visit with me today. Please don't hesitate to contact me if I can be of assistance to you before our next scheduled telephone appointment.  Following are the goals we discussed today:  Current Barriers:  Knowledge Deficits related to plan of care for management of HTN   RNCM Clinical Goal(s):  Patient will verbalize understanding of plan for management of HTN as evidenced by continuation of monitoring blood pressure and adhering to low sodium diet through collaboration with RN Care manager, provider, and care team.   Interventions: Inter-disciplinary care team collaboration (see longitudinal plan of care) Evaluation of current treatment plan related to  self management and patient's adherence to plan as established by provider  Patient Goals/Self-Care Activities: Take medications as prescribed   Attend all scheduled provider appointments Call pharmacy for medication refills 3-7 days in advance of running out of medications Attend church or other social activities Perform all self care activities independently  Perform IADL's (shopping, preparing meals, housekeeping, managing finances) independently Call provider office for new concerns or questions  call the Suicide and Crisis Lifeline: 988 if experiencing a Mental Health or [place]  check blood pressure daily choose a place to take my blood pressure (home, clinic or office, retail store) take blood pressure log to all doctor appointments call doctor for signs and symptoms of high blood pressure develop an action plan for high blood pressure keep all doctor appointments take medications for blood pressure exactly as prescribed begin an exercise program limit salt intake to 2300 mg/day Start exercise routine  Our next appointment is by telephone on July 07, 2021  Please call Johny Shock RN at [phone] if you need to cancel or reschedule your appointment.    Please call the Suicide and Crisis Lifeline: 988 if you are experiencing a Mental Health or [place] or need someone to talk to.  The patient verbalized understanding of instructions, educational materials, and care plan provided today and agreed to receive a mailed copy of patient instructions, educational materials, and care plan.   Telephone follow up appointment with care management team member scheduled for: The patient has been provided with contact information for the care management team and has been advised to call with any health related questions or concerns.   [place] Care Management [phone]

## 2021-04-06 NOTE — Patient Outreach (Signed)
[facility]) Care Management Dent Note   04/06/2021 Name:  Christine Navarro MRN:  [id number] DOB:  08-Jul-1950  Summary: Per patient her blood pressure readings are so much better. She is monitoring blood pressure at home. Patient appetite is good. She has started doing more social activities to get out. Per patient she has been walking when weather permits.    Recommendations/Changes made from today's visit: Monitor blood pressure Start exercise routine   Subjective: Christine Navarro is an 71 y.o. year old female who is a primary patient of Mayra Neer, MD. The care management team was consulted for assistance with care management and/or care coordination needs.    RN Health Coach completed Telephone Visit today.   Objective:  Medications Reviewed Today     Reviewed by Verlin Grills, RN (Case Manager) on 08/17/20 at [place] List Status: <None>   Medication Order Taking? Sig Documenting Provider Last Dose Status Informant  clopidogrel (PLAVIX) 75 MG tablet [id number] No Take 1 tablet (75 mg total) by mouth daily. Shelly Coss, MD Taking Active Self  irbesartan (AVAPRO) 300 MG tablet [id number] No Take 300 mg by mouth daily. [provider] Taking Active Self  lansoprazole (PREVACID) 15 MG capsule [id number] No Take 15 mg by mouth daily at 12 noon. [provider] Taking Active   LORazepam (ATIVAN) 1 MG tablet [id number] No Take 0.5 mg by mouth 2 (two) times daily as needed for anxiety or sleep.  [provider] Taking Active Self           Med Note Johnney Ou   Fri Sep 29, 2017  5:27 PM)    metoprolol tartrate (LOPRESSOR) 25 MG tablet [id number] No Take 25 mg by mouth 2 (two) times daily. Half in the morning and I tablet at bedtime [provider] Taking Active Self  oxybutynin (DITROPAN-XL) 5 MG 24 hr tablet [id number] No Take 5 mg by mouth daily. [provider] Taking Active Self   oxymetazoline (AFRIN) 0.05 % nasal spray [id number] No Place 1 spray into both nostrils daily as needed for congestion. [provider] Taking Active Self  rosuvastatin (CRESTOR) 20 MG tablet [id number] No Take 20 mg by mouth daily. [provider] Taking Active   trolamine salicylate (ASPERCREME) 10 % cream [id number] No Apply 1 application topically daily as needed for muscle pain. [provider] Taking Active Self             SDOH:  (Social Determinants of Health) assessments and interventions performed:  SDOH Interventions    Flowsheet Row Most Recent Value  SDOH Interventions   Food Insecurity Interventions Intervention Not Indicated  Housing Interventions Intervention Not Indicated  Transportation Interventions Intervention Not Indicated       Care Plan  Review of patient past medical history, allergies, medications, health status, including review of consultants reports, laboratory and other test data, was performed as part of comprehensive evaluation for care management services.   Care Plan : General Plan of Care (Adult)  Updates made by Janaa Acero, Eppie Gibson, RN since 04/06/2021 12:00 AM     Problem: Therapeutic Alliance (General Plan of Care) Resolved 04/06/2021  Priority: Medium  Onset Date: 04/22/2020  Note:   [id number] Resolving due to duplicate goal     Problem: Health Promotion or Disease Self-Management (General Plan of Care) Resolved 04/06/2021  Priority: High  Note:   [id number]    Long-Range Goal: Self-Management Plan Developed Completed 04/06/2021  Start  Date: 04/22/2020  Expected End Date: 03/12/2021  Recent Progress: On track  Priority: Medium  Note:   Evidence-based guidance:  Review biopsychosocial determinants of health screens.  Determine level of modifiable health risk.  Assess level of patient activation, level of readiness, importance and confidence to make changes.  Evoke change talk using open-ended questions, pros  and cons, as well as looking forward.  Identify areas where behavior change may lead to improved health.  Partner with patient to develop a robust self-management plan that includes lifestyle factors, such as weight loss, exercise and healthy nutrition, as well as goals specific to disease risks.  Support patient and family/caregiver active participation in decision-making and self-management plan.  Implement additional goals and interventions based on identified risk factors to reduce health risk.  Facilitate advance care planning.  Review need for preventive screening based on age, sex, family history and health history.   Notes:     Task: Mutually Develop and Royce Macadamia Achievement of Patient Goals Completed 04/06/2021  Due Date: 03/12/2021  Note:   Care Management Activities:    - choices provided - decision-making supported - health risks reviewed - questions answered - readiness for change evaluated - reassurance provided - self-reliance encouraged - verbalization of feelings encouraged    Notes:     Problem: Coping Skills (General Plan of Care) Resolved 04/06/2021  Priority: Medium  Onset Date: 04/22/2020  Note:   [id number] Resolving due to duplicate goal     Goal: Coping Skills Enhanced Completed 04/06/2021  Start Date: 01/27/2020  Expected End Date: 04/12/2021  Recent Progress: On track  Priority: Medium  Note:   Evidence-based guidance:  Acknowledge, normalize and validate difficulty of making life-long lifestyle changes.  Identify current effective and ineffective coping strategies.  Encourage patient and caregiver participation in care to increase self-esteem, confidence and feelings of control.  Consider alternative and complementary therapy approaches such as meditation, mindfulness or yoga.  Encourage participation in cognitive behavioral therapy to foster a positive identity, increase self-awareness, as well as bolster self-esteem, confidence and self-efficacy.   Discuss spirituality; be present as concerns are identified; encourage journaling, prayer, worship services, meditation or pastoral counseling.  Encourage participation in pleasurable group activities such as hobbies, singing, sports or volunteering).  Encourage the use of mindfulness; refer for training or intensive intervention.  Consider the use of meditative movement therapy such as tai chi, yoga or qigong.  Promote a regular daily exercise program based on tolerance, ability and patient choice to support positive thinking about disease or aging.   Notes:  [id number] Patient walks when possible outside.    Task: Support Psychosocial Response to Risk or Actual Health Condition Completed 04/06/2021  Due Date: 04/12/2021  Note:   Care Management Activities:    - active listening utilized - decision-making supported - healthy lifestyle promoted - relaxation techniques promoted - spiritual activities promoted - verbalization of feelings encouraged    Notes:     Problem: Quality of Life (General Plan of Care) Resolved 04/06/2021  Priority: Medium  Onset Date: 04/22/2020  Note:   [id number] Resolving due to duplicate goal     Long-Range Goal: Quality of Life Maintained Completed 04/06/2021  Expected End Date: 04/12/2021  Recent Progress: On track  Note:   Evidence-based guidance:  Assess patient's thoughts about quality of life, goals and expectations, and dissatisfaction or desire to improve.  Identify issues of primary importance such as mental health, illness, exercise tolerance, pain, sexual function and intimacy, cognitive change, social isolation, finances  and relationships.  Assess and monitor for signs/symptoms of psychosocial concerns, especially depression or ideations regarding harm to others or self; provide or refer for mental health services as needed.  Identify sensory issues that impact quality of life such as hearing loss, vision deficit; strategize ways to maintain or  improve hearing, vision.  Promote access to services in the community to support independence such as support groups, home visiting programs, financial assistance, handicapped parking tags, durable medical equipment and emergency responder.  Promote activities to decrease social isolation such as group support or social, leisure and recreational activities, employment, use of social media; consider safety concerns about being out of home for activities.  Provide patient an opportunity to share by storytelling or a "life review" to give positive meaning to life and to assist with coping and negative experiences.  Encourage patient to tap into hope to improve sense of self.  Counsel based on prognosis and as early as possible about end-of-life and palliative care; consider referral to palliative care provider.  Advocate for the development of palliative care plan that may include avoidance of unnecessary testing and intervention, symptom control, discontinuation of medications, hospice and organ donation.  Counsel as early as possible those with life-limiting chronic disease about palliative care; consider referral to palliative care provider.  Advocate for the development of palliative care plan.   Notes:     Task: Support and Maintain Acceptable Degree of Health, Comfort and Happiness Completed 04/06/2021  Due Date: 04/12/2021  Note:   Care Management Activities:    - patient strengths promoted - self-expression encouraged - wellness behaviors promoted    Notes:  [id number] Patient is following thru with health maintenance    Care Plan : Hypertension (Adult)  Updates made by Verlin Grills, RN since 04/06/2021 12:00 AM     Problem: Hypertension (Hypertension) Resolved 04/06/2021  Priority: High  Onset Date: 04/22/2020  Note:   [id number] Resolving due to duplicate goal     Long-Range Goal: Hypertension Monitored Completed 04/06/2021  Start Date: 04/22/2020  Expected End Date: 11/10/2020   Priority: High  Note:   Evidence-based guidance:  Promote initial use of ambulatory blood pressure measurements (for 3 days) to rule out "white-coat" effect; identify masked hypertension and presence or absence of nocturnal "dipping" of blood pressure.   Encourage continued use of home blood pressure monitoring and recording in blood pressure log; include symptoms of hypotension or potential medication side effects in log.  Review blood pressure measurements taken inside and outside of the provider office; establish baseline and monitor trends; compare to target ranges or patient goal.  Share overall cardiovascular risk with patient; encourage changes to lifestyle risk factors, including alcohol consumption, smoking, inadequate exercise, poor dietary habits and stress.   Notes:  [id number] Resolving due to duplicate goal     Task: Identify and Monitor Blood Pressure Elevation Completed 04/06/2021  Due Date: 03/12/2021  Note:   Care Management Activities:    - home or ambulatory blood pressure monitoring encouraged    Notes:  [id number] Patient is taking her blood pressure daily    Problem: Disease Progression (Hypertension) Resolved 04/06/2021  Priority: Medium  Onset Date: 04/22/2020  Note:   [id number] Resolving due to duplicate goal     Long-Range Goal: Disease Progression Prevented or Minimized Completed 04/06/2021  Start Date: 04/22/2020  Expected End Date: 11/10/2020  Priority: Medium  Note:   Evidence-based guidance:  Tailor lifestyle advice to individual; review progress regularly; give frequent encouragement and respond positively  to incremental successes.  Assess for and promote awareness of worsening disease or development of comorbidity.  Prepare patient for laboratory and diagnostic exams based on risk and presentation.  Prepare patient for use of pharmacologic therapy that may include diuretic, beta-blocker, beta-blocker/thiazide combination, angiotensin-converting enzyme  inhibitor, renin-angiotensin blocker or calcium-channel blocker.  Expect periodic adjustments to pharmacologic therapy; manage side effects.  Promote a healthy diet that includes primarily plant-based foods, such as fruits, vegetables, whole grains, beans and legumes, low-fat dairy and lean meats.   Consider moderate reduction in sodium intake by avoiding the addition of salt to prepared foods and limiting processed meats, canned soup, frozen meals and salty snacks.   Promote a regular, daily exercise goal of 150 minutes per week of moderate exercise based on tolerance, ability and patient choice; consider referral to physical therapist, community wellness and/or activity program.  Encourage the avoidance of no more than 2 hours per day of sedentary activity, such as recreational screen time.  Review sources of stress; explore current coping strategies and encourage use of mindfulness, yoga, meditation or exercise to manage stress.   Notes:  [id number] Resolving due to duplicate goal     Task: Alleviate Barriers to Hypertension Treatment Completed 04/06/2021  Due Date: 03/12/2021  Note:   Care Management Activities:    - healthy diet promoted - patient response to treatment assessed - reduction in sedentary activities encouraged    Notes:     Problem: Resistant Hypertension (Hypertension) Resolved 04/06/2021  Priority: Medium  Onset Date: 04/22/2020  Note:   [id number] Resolving due to duplicate goal     Goal: Response to Treatment Maximized Completed 04/06/2021  Start Date: 04/22/2020  Expected End Date: 11/10/2020  Priority: Medium  Note:   Evidence-based guidance:  Assess patient response to treatment, including presence or absence of medication side effects, degree of blood pressure control and patient satisfaction.  Assess technique (including cuff size and placement), measurement times, condition and calibration of blood pressure cuff set (both at-home and in-office equipment).   Assess factors that may influence response to treatment, including nonadherence to pharmacologic treatment plan, diet or activity changes and/or presence of pain, stress or sleep disturbance.  Screen for signs and symptoms of depression; if present, refer for or complete a comprehensive assessment.  Evaluate social and economic barriers that may affect adherence to treatment plan  Address pharmacologic nonadherence by simplifying dosing regimen, counseling or support by pharmacist, financial assistance, self-monitoring of blood pressure, use of motivational interviewing, voice or text messages.  Encourage behavioral adherence strategies, like habit-based interventions that link medication taking with existing daily routines.  Assess barriers to regular, daily physical activity; support family or support person-oriented activity changes and utilization of community activity or sports program.  Address barriers to dietary changes, especially sodium restriction, with referrals to community programs, like cooking classes, meal services or intensive education when available.  Refer to community-based peer support program or nurse home-visiting program.  Assess for chronic pain; when present add additional goals (Chronic Pain Care Plan Guide) as needed.  Provide frequent follow-up by telephone, telemonitoring, patient-practice portal or with home visit.  Review alcohol use screen; address using brief intervention beginning with risk that interferes with blood pressure control; refer for treatment when excessive alcohol use is noted.  Screen for obstructive sleep apnea; prepare patient for polysomnography based on risk and presentation and use of noninvasive ventilation to relieve obstructive sleep apnea when present.   Notes:     Task: Facilitate Adherence to  Lifestyle Change Completed 04/06/2021  Due Date: 03/12/2021  Note:   Care Management Activities:    - barriers to lifestyle change identified -  medication adherence assessment completed - success praised - support and encouragement provided    Notes:     Care Plan : [place] of Care  Updates made by Rashunda Passon, Eppie Gibson, RN since 04/06/2021 12:00 AM     Problem: Knowledge Deficit Related to HTN and Care Coordination Needs   Priority: High     Long-Range Goal: Development Plan of Care for Management of Diabetes   Start Date: 04/06/2021  Expected End Date: 03/12/2022  Priority: High  Note:   Current Barriers:  Knowledge Deficits related to plan of care for management of HTN   RNCM Clinical Goal(s):  Patient will verbalize understanding of plan for management of HTN as evidenced by continuation of monitoring blood pressure and adhering to low sodium diet  through collaboration with RN Care manager, provider, and care team.   Interventions: Inter-disciplinary care team collaboration (see longitudinal plan of care) Evaluation of current treatment plan related to  self management and patient's adherence to plan as established by provider  Patient Goals/Self-Care Activities: Take medications as prescribed   Attend all scheduled provider appointments Call pharmacy for medication refills 3-7 days in advance of running out of medications Attend church or other social activities Perform all self care activities independently  Perform IADL's (shopping, preparing meals, housekeeping, managing finances) independently Call provider office for new concerns or questions  call the Suicide and Crisis Lifeline: 988 if experiencing a Mental Health or [place]  check blood pressure daily choose a place to take my blood pressure (home, clinic or office, retail store) take blood pressure log to all doctor appointments call doctor for signs and symptoms of high blood pressure develop an action plan for high blood pressure keep all doctor appointments take medications for blood pressure exactly as  prescribed begin an exercise program limit salt intake to 2300 mg/day Start exercise routine        Plan: Telephone follow up appointment with care management team member scheduled for:  April 2023. The patient has been provided with contact information for the care management team and has been advised to call with any health related questions or concerns.  Ordered Free COVID testing kits  [facility] [phone]

## 2021-05-13 DIAGNOSIS — Z20822 Contact with and (suspected) exposure to covid-19: Secondary | ICD-10-CM | POA: Diagnosis not present

## 2021-05-19 ENCOUNTER — Other Ambulatory Visit: Payer: Self-pay | Admitting: Family Medicine

## 2021-05-19 DIAGNOSIS — D329 Benign neoplasm of meninges, unspecified: Secondary | ICD-10-CM | POA: Diagnosis not present

## 2021-05-19 DIAGNOSIS — Z0000 Encounter for general adult medical examination without abnormal findings: Secondary | ICD-10-CM | POA: Diagnosis not present

## 2021-05-19 DIAGNOSIS — K9041 Non-celiac gluten sensitivity: Secondary | ICD-10-CM | POA: Diagnosis not present

## 2021-05-19 DIAGNOSIS — F411 Generalized anxiety disorder: Secondary | ICD-10-CM | POA: Diagnosis not present

## 2021-05-19 DIAGNOSIS — I519 Heart disease, unspecified: Secondary | ICD-10-CM | POA: Diagnosis not present

## 2021-05-19 DIAGNOSIS — G4733 Obstructive sleep apnea (adult) (pediatric): Secondary | ICD-10-CM | POA: Diagnosis not present

## 2021-05-19 DIAGNOSIS — M8589 Other specified disorders of bone density and structure, multiple sites: Secondary | ICD-10-CM

## 2021-05-19 DIAGNOSIS — I10 Essential (primary) hypertension: Secondary | ICD-10-CM | POA: Diagnosis not present

## 2021-05-19 DIAGNOSIS — Z1231 Encounter for screening mammogram for malignant neoplasm of breast: Secondary | ICD-10-CM

## 2021-05-19 DIAGNOSIS — R82998 Other abnormal findings in urine: Secondary | ICD-10-CM | POA: Diagnosis not present

## 2021-05-19 DIAGNOSIS — Z8673 Personal history of transient ischemic attack (TIA), and cerebral infarction without residual deficits: Secondary | ICD-10-CM | POA: Diagnosis not present

## 2021-05-19 DIAGNOSIS — E7800 Pure hypercholesterolemia, unspecified: Secondary | ICD-10-CM | POA: Diagnosis not present

## 2021-05-19 DIAGNOSIS — E1169 Type 2 diabetes mellitus with other specified complication: Secondary | ICD-10-CM | POA: Diagnosis not present

## 2021-05-19 DIAGNOSIS — I517 Cardiomegaly: Secondary | ICD-10-CM | POA: Diagnosis not present

## 2021-05-19 DIAGNOSIS — I69320 Aphasia following cerebral infarction: Secondary | ICD-10-CM | POA: Diagnosis not present

## 2021-05-29 DIAGNOSIS — H1089 Other conjunctivitis: Secondary | ICD-10-CM | POA: Diagnosis not present

## 2021-06-09 DIAGNOSIS — Z20822 Contact with and (suspected) exposure to covid-19: Secondary | ICD-10-CM | POA: Diagnosis not present

## 2021-07-01 DIAGNOSIS — Z20822 Contact with and (suspected) exposure to covid-19: Secondary | ICD-10-CM | POA: Diagnosis not present

## 2021-07-07 ENCOUNTER — Other Ambulatory Visit: Payer: Self-pay | Admitting: *Deleted

## 2021-07-07 NOTE — Patient Outreach (Signed)
[facility]) Care Management ?RN Health Coach Note ? ? ?07/07/2021 ?Name:  Christine Navarro MRN:  [id number] DOB:  10/09/1950 ? ?Summary: ?Patient home blood pressure range is 149/76-130/70. She has an exercise routine. She is socializing more and getting out the house. Her appetite is good ? ?Recommendations/Changes made from today's visit: ?Continue exercising ?Adhere to diet ?Medication adherence ? ? ?Subjective: ?Christine Navarro is an 71 y.o. year old female who is a primary patient of Christine Neer, MD. The care management team was consulted for assistance with care management and/or care coordination needs.   ? ?RN Health Coach completed Telephone Visit today.  ? ?Objective: ? ?Medications Reviewed Today   ? ? Reviewed by Verlin Grills, RN (Case Manager) on 08/17/20 at [place] List Status: <None>  ? ?Medication Order Taking? Sig Documenting Provider Last Dose Status Informant  ?clopidogrel (PLAVIX) 75 MG tablet [id number] No Take 1 tablet (75 mg total) by mouth daily. Shelly Coss, MD Taking Active Self  ?irbesartan (AVAPRO) 300 MG tablet [id number] No Take 300 mg by mouth daily. [provider] Taking Active Self  ?lansoprazole (PREVACID) 15 MG capsule [id number] No Take 15 mg by mouth daily at 12 noon. [provider] Taking Active   ?LORazepam (ATIVAN) 1 MG tablet [id number] No Take 0.5 mg by mouth 2 (two) times daily as needed for anxiety or sleep.  [provider] Taking Active Self  ?         ?Med Note Johnney Ou   Fri Sep 29, 2017  5:27 PM)    ?metoprolol tartrate (LOPRESSOR) 25 MG tablet [id number] No Take 25 mg by mouth 2 (two) times daily. Half in the morning and I tablet at bedtime [provider] Taking Active Self  ?oxybutynin (DITROPAN-XL) 5 MG 24 hr tablet [id number] No Take 5 mg by mouth daily. [provider] Taking Active Self  ?oxymetazoline (AFRIN) 0.05 % nasal spray [id number] No Place 1 spray into both  nostrils daily as needed for congestion. [provider] Taking Active Self  ?rosuvastatin (CRESTOR) 20 MG tablet [id number] No Take 20 mg by mouth daily. [provider] Taking Active   ?trolamine salicylate (ASPERCREME) 10 % cream [id number] No Apply 1 application topically daily as needed for muscle pain. [provider] Taking Active Self  ? ?  ?  ? ?  ? ? ? ?SDOH:  (Social Determinants of Health) assessments and interventions performed:  ? ? ?Care Plan ? ?Review of patient past medical history, allergies, medications, health status, including review of consultants reports, laboratory and other test data, was performed as part of comprehensive evaluation for care management services.  ? ?Care Plan : RN Care Manager Plan of Care  ?Updates made by Regina Coppolino, Eppie Gibson, RN since 07/07/2021 12:00 AM  ?  ? ?Problem: Knowledge Deficit Related to HTN and Care Coordination Needs   ?Priority: High  ?  ? ?Long-Range Goal: Development Plan of Care for Management of Diabetes   ?Start Date: 04/06/2021  ?Expected End Date: 03/12/2022  ?Priority: High  ?Note:   ?Current Barriers:  ?Knowledge Deficits related to plan of care for management of HTN  ? ?RNCM Clinical Goal(s):  ?Patient will verbalize understanding of plan for management of HTN as evidenced by continuation of monitoring blood pressure and adhering to low sodium diet  through collaboration with RN Care manager, provider, and care team.  ? ?Interventions: ?Inter-disciplinary care team collaboration (see longitudinal plan of care) ?Evaluation of  current treatment plan related to  self management and patient's adherence to plan as established by provider ? ?Patient Goals/Self-Care Activities: ?Take medications as prescribed   ?Attend all scheduled provider appointments ?Call pharmacy for medication refills 3-7 days in advance of running out of medications ?Attend church or other social activities ?Perform all self care activities independently   ?Perform IADL's (shopping, preparing meals, housekeeping, managing finances) independently ?Call provider office for new concerns or questions  ?call the Suicide and Crisis Lifeline: 988 if experiencing a Mental Health or [place]  ?check blood pressure daily ?choose a place to take my blood pressure (home, clinic or office, retail store) ?take blood pressure log to all doctor appointments ?call doctor for signs and symptoms of high blood pressure ?develop an action plan for high blood pressure ?keep all doctor appointments ?take medications for blood pressure exactly as prescribed ?begin an exercise program ?limit salt intake to 2300 mg/day ?Continue exercise routine ?  ?[id number] Patient home blood pressure range has been 149/76-130/70. Per patient she is eating better. She is walking her neighbor dog for exercise. She is getting out of house more. Per patient she is feeling much better.  ?  ?  ? ?Plan: Telephone follow up appointment with care management team member scheduled for:  September 15, 2021 ?The patient has been provided with contact information for the care management team and has been advised to call with any health related questions or concerns.  ? ?Johny Shock BSN RN ?[facility] [facility] ?[phone] ? ? ? ?  ?

## 2021-07-07 NOTE — Patient Instructions (Signed)
Visit Information ? ?Thank you for taking time to visit with me today. Please don't hesitate to contact me if I can be of assistance to you before our next scheduled telephone appointment. ? ?Following are the goals we discussed today:  ?Current Barriers:  ?Knowledge Deficits related to plan of care for management of HTN  ? ?RNCM Clinical Goal(s):  ?Patient will verbalize understanding of plan for management of HTN as evidenced by continuation of monitoring blood pressure and adhering to low sodium diet through collaboration with RN Care manager, provider, and care team.  ? ?Interventions: ?Inter-disciplinary care team collaboration (see longitudinal plan of care) ?Evaluation of current treatment plan related to  self management and patient's adherence to plan as established by provider ? ?Patient Goals/Self-Care Activities: ?Take medications as prescribed   ?Attend all scheduled provider appointments ?Call pharmacy for medication refills 3-7 days in advance of running out of medications ?Attend church or other social activities ?Perform all self care activities independently  ?Perform IADL's (shopping, preparing meals, housekeeping, managing finances) independently ?Call provider office for new concerns or questions  ?call the Suicide and Crisis Lifeline: 988 if experiencing a Mental Health or [place]  ?check blood pressure daily ?choose a place to take my blood pressure (home, clinic or office, retail store) ?take blood pressure log to all doctor appointments ?call doctor for signs and symptoms of high blood pressure ?develop an action plan for high blood pressure ?keep all doctor appointments ?take medications for blood pressure exactly as prescribed ?begin an exercise program ?limit salt intake to 2300 mg/day ?Continue exercise routine ?  ?[id number] Patient home blood pressure range has been 149/76-130/70. Per patient she is eating better. She is walking her neighbor dog for exercise. She is  getting out of house more. Per patient she is feeling much better.  ? ?Our next appointment is by telephone on September 15, 2021 ? ?Please call Johny Shock RN [phone] if you need to cancel or reschedule your appointment.  ? ?Please call the Suicide and Crisis Lifeline: 988 if you are experiencing a Mental Health or [place] or need someone to talk to. ? ?The patient verbalized understanding of instructions, educational materials, and care plan provided today and agreed to receive a mailed copy of patient instructions, educational materials, and care plan.  ? ?Telephone follow up appointment with care management team member scheduled for: ?The patient has been provided with contact information for the care management team and has been advised to call with any health related questions or concerns.  ? ?SIGNATURE ? ?Johny Shock BSN RN ?[facility] ?[phone] ? ? ? ? ?

## 2021-07-12 DIAGNOSIS — Z20822 Contact with and (suspected) exposure to covid-19: Secondary | ICD-10-CM | POA: Diagnosis not present

## 2021-07-13 DIAGNOSIS — Z20822 Contact with and (suspected) exposure to covid-19: Secondary | ICD-10-CM | POA: Diagnosis not present

## 2021-07-13 DIAGNOSIS — G4733 Obstructive sleep apnea (adult) (pediatric): Secondary | ICD-10-CM | POA: Diagnosis not present

## 2021-07-16 DIAGNOSIS — R040 Epistaxis: Secondary | ICD-10-CM | POA: Diagnosis not present

## 2021-07-22 DIAGNOSIS — H40023 Open angle with borderline findings, high risk, bilateral: Secondary | ICD-10-CM | POA: Diagnosis not present

## 2021-09-15 ENCOUNTER — Other Ambulatory Visit: Payer: Self-pay | Admitting: *Deleted

## 2021-09-15 NOTE — Patient Outreach (Signed)
[facility]) Care Management  09/15/2021  Christine Navarro April 30, 1950 [id number]   [place] telephone call to patient.  [employer] compliance verified. Patient stated her blood pressure readings are more stable. Readings are from 137/65-146/72. She is taking her medications as per ordered. She is exercising daily walking the neighbors dog M-F.  Plan: Case closure goals met Case closure letter sent to PCP Case closure letter sent to Patient Certificate of Completion sent to patient  [facility] [phone]

## 2021-10-26 ENCOUNTER — Ambulatory Visit
Admission: RE | Admit: 2021-10-26 | Discharge: 2021-10-26 | Disposition: A | Discharge status: Home / Self Care | Payer: Medicare Other | Source: Ambulatory Visit | Attending: Family Medicine | Admitting: Family Medicine

## 2021-10-26 DIAGNOSIS — Z780 Asymptomatic menopausal state: Secondary | ICD-10-CM | POA: Diagnosis not present

## 2021-10-26 DIAGNOSIS — Z1231 Encounter for screening mammogram for malignant neoplasm of breast: Secondary | ICD-10-CM

## 2021-10-26 DIAGNOSIS — M85852 Other specified disorders of bone density and structure, left thigh: Secondary | ICD-10-CM | POA: Diagnosis not present

## 2021-10-26 DIAGNOSIS — M8589 Other specified disorders of bone density and structure, multiple sites: Secondary | ICD-10-CM

## 2021-11-19 DIAGNOSIS — I10 Essential (primary) hypertension: Secondary | ICD-10-CM | POA: Diagnosis not present

## 2021-11-19 DIAGNOSIS — F411 Generalized anxiety disorder: Secondary | ICD-10-CM | POA: Diagnosis not present

## 2021-11-19 DIAGNOSIS — E7800 Pure hypercholesterolemia, unspecified: Secondary | ICD-10-CM | POA: Diagnosis not present

## 2021-11-19 DIAGNOSIS — E1169 Type 2 diabetes mellitus with other specified complication: Secondary | ICD-10-CM | POA: Diagnosis not present

## 2021-11-19 DIAGNOSIS — E669 Obesity, unspecified: Secondary | ICD-10-CM | POA: Diagnosis not present

## 2021-12-12 DIAGNOSIS — N390 Urinary tract infection, site not specified: Secondary | ICD-10-CM | POA: Diagnosis not present

## 2021-12-28 DIAGNOSIS — R309 Painful micturition, unspecified: Secondary | ICD-10-CM | POA: Diagnosis not present

## 2022-01-25 DIAGNOSIS — E113292 Type 2 diabetes mellitus with mild nonproliferative diabetic retinopathy without macular edema, left eye: Secondary | ICD-10-CM | POA: Diagnosis not present

## 2022-02-14 DIAGNOSIS — N200 Calculus of kidney: Secondary | ICD-10-CM | POA: Diagnosis not present

## 2022-02-14 DIAGNOSIS — N281 Cyst of kidney, acquired: Secondary | ICD-10-CM | POA: Diagnosis not present

## 2022-02-14 DIAGNOSIS — R34 Anuria and oliguria: Secondary | ICD-10-CM | POA: Diagnosis not present

## 2022-02-25 DIAGNOSIS — Z23 Encounter for immunization: Secondary | ICD-10-CM | POA: Diagnosis not present

## 2022-03-16 DIAGNOSIS — L304 Erythema intertrigo: Secondary | ICD-10-CM | POA: Diagnosis not present

## 2022-03-16 DIAGNOSIS — L508 Other urticaria: Secondary | ICD-10-CM | POA: Diagnosis not present

## 2022-03-16 DIAGNOSIS — L503 Dermatographic urticaria: Secondary | ICD-10-CM | POA: Diagnosis not present

## 2022-04-07 DIAGNOSIS — J301 Allergic rhinitis due to pollen: Secondary | ICD-10-CM | POA: Diagnosis not present

## 2022-04-07 DIAGNOSIS — J3089 Other allergic rhinitis: Secondary | ICD-10-CM | POA: Diagnosis not present

## 2022-04-07 DIAGNOSIS — L501 Idiopathic urticaria: Secondary | ICD-10-CM | POA: Diagnosis not present

## 2022-04-15 DIAGNOSIS — L501 Idiopathic urticaria: Secondary | ICD-10-CM | POA: Diagnosis not present

## 2022-05-26 DIAGNOSIS — I517 Cardiomegaly: Secondary | ICD-10-CM | POA: Diagnosis not present

## 2022-05-26 DIAGNOSIS — Z0000 Encounter for general adult medical examination without abnormal findings: Secondary | ICD-10-CM | POA: Diagnosis not present

## 2022-05-26 DIAGNOSIS — N181 Chronic kidney disease, stage 1: Secondary | ICD-10-CM | POA: Diagnosis not present

## 2022-05-26 DIAGNOSIS — I519 Heart disease, unspecified: Secondary | ICD-10-CM | POA: Diagnosis not present

## 2022-05-26 DIAGNOSIS — I10 Essential (primary) hypertension: Secondary | ICD-10-CM | POA: Diagnosis not present

## 2022-05-26 DIAGNOSIS — G4733 Obstructive sleep apnea (adult) (pediatric): Secondary | ICD-10-CM | POA: Diagnosis not present

## 2022-05-26 DIAGNOSIS — E669 Obesity, unspecified: Secondary | ICD-10-CM | POA: Diagnosis not present

## 2022-05-26 DIAGNOSIS — D329 Benign neoplasm of meninges, unspecified: Secondary | ICD-10-CM | POA: Diagnosis not present

## 2022-05-26 DIAGNOSIS — Z8673 Personal history of transient ischemic attack (TIA), and cerebral infarction without residual deficits: Secondary | ICD-10-CM | POA: Diagnosis not present

## 2022-05-26 DIAGNOSIS — F411 Generalized anxiety disorder: Secondary | ICD-10-CM | POA: Diagnosis not present

## 2022-05-26 DIAGNOSIS — I69320 Aphasia following cerebral infarction: Secondary | ICD-10-CM | POA: Diagnosis not present

## 2022-05-26 DIAGNOSIS — E7800 Pure hypercholesterolemia, unspecified: Secondary | ICD-10-CM | POA: Diagnosis not present

## 2022-05-26 DIAGNOSIS — E1169 Type 2 diabetes mellitus with other specified complication: Secondary | ICD-10-CM | POA: Diagnosis not present

## 2022-06-03 ENCOUNTER — Other Ambulatory Visit: Payer: Self-pay

## 2022-06-03 ENCOUNTER — Emergency Department (HOSPITAL_BASED_OUTPATIENT_CLINIC_OR_DEPARTMENT_OTHER): Payer: Medicare Other

## 2022-06-03 ENCOUNTER — Encounter (HOSPITAL_BASED_OUTPATIENT_CLINIC_OR_DEPARTMENT_OTHER): Payer: Self-pay | Admitting: Emergency Medicine

## 2022-06-03 ENCOUNTER — Emergency Department (HOSPITAL_BASED_OUTPATIENT_CLINIC_OR_DEPARTMENT_OTHER)
Admission: EM | Admit: 2022-06-03 | Discharge: 2022-06-03 | Disposition: A | Discharge status: Home / Self Care | Payer: Medicare Other | Attending: Emergency Medicine | Admitting: Emergency Medicine

## 2022-06-03 DIAGNOSIS — R109 Unspecified abdominal pain: Secondary | ICD-10-CM | POA: Diagnosis not present

## 2022-06-03 DIAGNOSIS — D72829 Elevated white blood cell count, unspecified: Secondary | ICD-10-CM | POA: Insufficient documentation

## 2022-06-03 DIAGNOSIS — K5792 Diverticulitis of intestine, part unspecified, without perforation or abscess without bleeding: Secondary | ICD-10-CM

## 2022-06-03 DIAGNOSIS — R1032 Left lower quadrant pain: Secondary | ICD-10-CM | POA: Diagnosis present

## 2022-06-03 DIAGNOSIS — K5732 Diverticulitis of large intestine without perforation or abscess without bleeding: Secondary | ICD-10-CM | POA: Insufficient documentation

## 2022-06-03 DIAGNOSIS — Z7902 Long term (current) use of antithrombotics/antiplatelets: Secondary | ICD-10-CM | POA: Diagnosis not present

## 2022-06-03 DIAGNOSIS — N200 Calculus of kidney: Secondary | ICD-10-CM | POA: Diagnosis not present

## 2022-06-03 DIAGNOSIS — N281 Cyst of kidney, acquired: Secondary | ICD-10-CM | POA: Diagnosis not present

## 2022-06-03 LAB — COMPREHENSIVE METABOLIC PANEL
ALT: 9 U/L (ref 0–44)
AST: 14 U/L — ABNORMAL LOW (ref 15–41)
Albumin: 4.6 g/dL (ref 3.5–5.0)
Alkaline Phosphatase: 86 U/L (ref 38–126)
Anion gap: 11 (ref 5–15)
BUN: 15 mg/dL (ref 8–23)
CO2: 24 mmol/L (ref 22–32)
Calcium: 9.9 mg/dL (ref 8.9–10.3)
Chloride: 101 mmol/L (ref 98–111)
Creatinine, Ser: 0.8 mg/dL (ref 0.44–1.00)
GFR, Estimated: >60 mL/min (ref >60)
Glucose, Bld: 147 mg/dL — ABNORMAL HIGH (ref 70–99)
Potassium: 4.1 mmol/L (ref 3.5–5.1)
Sodium: 136 mmol/L (ref 135–145)
Total Bilirubin: 0.7 mg/dL (ref 0.3–1.2)
Total Protein: 7.8 g/dL (ref 6.5–8.1)

## 2022-06-03 LAB — URINALYSIS, ROUTINE W REFLEX MICROSCOPIC
Bilirubin Urine: NEGATIVE
Glucose, UA: NEGATIVE mg/dL
Ketones, ur: 15 mg/dL — AB
Nitrite: NEGATIVE
Protein, ur: 30 mg/dL — AB
Specific Gravity, Urine: 1.031 — ABNORMAL HIGH (ref 1.005–1.030)
pH: 5.5 (ref 5.0–8.0)

## 2022-06-03 LAB — CBC
HCT: 41.9 % (ref 36.0–46.0)
Hemoglobin: 13.8 g/dL (ref 12.0–15.0)
MCH: 29.4 pg (ref 26.0–34.0)
MCHC: 32.9 g/dL (ref 30.0–36.0)
MCV: 89.3 fL (ref 80.0–100.0)
Platelets: 235 10*3/uL (ref 150–400)
RBC: 4.69 MIL/uL (ref 3.87–5.11)
RDW: 14.1 % (ref 11.5–15.5)
WBC: 17.2 10*3/uL — ABNORMAL HIGH (ref 4.0–10.5)
nRBC: 0 % (ref 0.0–0.2)

## 2022-06-03 LAB — LIPASE, BLOOD: Lipase: 14 U/L (ref 11–51)

## 2022-06-03 MED ORDER — ONDANSETRON HCL 4 MG/2ML IJ SOLN
4.0000 mg | Freq: Once | INTRAMUSCULAR | Status: AC
  Administered 2022-06-03: 4 mg via INTRAVENOUS
  Filled 2022-06-03: qty 2

## 2022-06-03 MED ORDER — HYDROMORPHONE HCL 1 MG/ML IJ SOLN
1.0000 mg | Freq: Once | INTRAMUSCULAR | Status: AC
  Administered 2022-06-03: 1 mg via INTRAVENOUS
  Filled 2022-06-03: qty 1

## 2022-06-03 MED ORDER — PIPERACILLIN-TAZOBACTAM 3.375 G IVPB 30 MIN
3.3750 g | Freq: Once | INTRAVENOUS | Status: AC
  Administered 2022-06-03: 3.375 g via INTRAVENOUS
  Filled 2022-06-03: qty 50

## 2022-06-03 MED ORDER — AMOXICILLIN-POT CLAVULANATE 875-125 MG PO TABS: 1.0000 | ORAL_TABLET | Freq: Two times a day (BID) | ORAL | 0 refills | Status: DC

## 2022-06-03 MED ORDER — SODIUM CHLORIDE 0.9 % IV BOLUS
1000.0000 mL | Freq: Once | INTRAVENOUS | Status: AC
  Administered 2022-06-03: 1000 mL via INTRAVENOUS

## 2022-06-03 MED ORDER — HYDROCODONE-ACETAMINOPHEN 5-325 MG PO TABS: 1.0000 | ORAL_TABLET | Freq: Four times a day (QID) | ORAL | 0 refills | Status: DC | PRN

## 2022-06-03 MED ORDER — IOHEXOL 300 MG/ML  SOLN
100.0000 mL | Freq: Once | INTRAMUSCULAR | Status: AC | PRN
  Administered 2022-06-03: 80 mL via INTRAVENOUS

## 2022-06-03 NOTE — ED Notes (Signed)
Discharge instructions, follow up care, pain management and prescriptions reviewed and explained, pt verbalized understanding and had no further questions on d/c.

## 2022-06-03 NOTE — ED Provider Notes (Signed)
[facility] Provider Note   CSN: [id number] Arrival date & time: 06/03/22  1309     History  Chief Complaint  Patient presents with   Abdominal Pain    Christine Navarro is a 72 y.o. female, history of cholecystectomy, appendectomy, hysterectomy, who presents to the ED secondary to left lower abdominal pain radiating to the right flank that is been going on for the last 3 days.  She states it is intermittent in nature, comes and goes, sharp and stabbing, and 9 out of 10.  No urinary symptoms, vaginal bleeding, vomiting or diarrhea.  Does state that she has been constipated, but did have a Christine Navarro bowel movement this a.m. Is passing gas.  Notes she has had frequent kidney stones, diverticulitis in the past.  Has not tried anything for the pain.  Home Medications Prior to Admission medications   Medication Sig Start Date End Date Taking? Authorizing Provider  amoxicillin-clavulanate (AUGMENTIN) 875-125 MG tablet Take 1 tablet by mouth every 12 (twelve) hours. 06/03/22  Yes Kaushik Maul L, PA  HYDROcodone-acetaminophen (NORCO) 5-325 MG tablet Take 1 tablet by mouth every 6 (six) hours as needed for moderate pain. 06/03/22  Yes Eliazar Olivar L, PA  clopidogrel (PLAVIX) 75 MG tablet Take 1 tablet (75 mg total) by mouth daily. 10/01/17   Shelly Coss, MD  irbesartan (AVAPRO) 300 MG tablet Take 300 mg by mouth daily.    [provider]  lansoprazole (PREVACID) 15 MG capsule Take 15 mg by mouth daily at 12 noon.    [provider]  LORazepam (ATIVAN) 1 MG tablet Take 0.5 mg by mouth 2 (two) times daily as needed for anxiety or sleep.     [provider]  metoprolol tartrate (LOPRESSOR) 25 MG tablet Take 25 mg by mouth 2 (two) times daily. Half in the morning and I tablet at bedtime    [provider]  oxybutynin (DITROPAN-XL) 5 MG 24 hr tablet Take 5 mg by mouth daily. 08/30/17   [provider]   oxymetazoline (AFRIN) 0.05 % nasal spray Place 1 spray into both nostrils daily as needed for congestion.    [provider]  rosuvastatin (CRESTOR) 20 MG tablet Take 20 mg by mouth daily.    [provider]  trolamine salicylate (ASPERCREME) 10 % cream Apply 1 application topically daily as needed for muscle pain.    [provider]      Allergies    Ace inhibitors, Ciprofloxacin, Sular [nisoldipine er], Vitamin d analogs, Welchol [colesevelam hcl], Erythromycin, Nickel, Sulfa antibiotics, and Tape    Review of Systems   Review of Systems  Gastrointestinal:  Positive for abdominal pain, constipation and nausea. Negative for diarrhea and vomiting.    Physical Exam Updated Vital Signs BP (!) 142/67   Pulse 66   Temp 98.4 F (36.9 C)   Resp 18   SpO2 98%  Physical Exam Vitals and nursing note reviewed.  Constitutional:      General: She is not in acute distress.    Appearance: She is well-developed.  HENT:     Head: Normocephalic and atraumatic.  Eyes:     Conjunctiva/sclera: Conjunctivae normal.  Cardiovascular:     Rate and Rhythm: Normal rate and regular rhythm.     Heart sounds: No murmur heard. Pulmonary:     Effort: Pulmonary effort is normal. No respiratory distress.     Breath sounds: Normal breath sounds.  Abdominal:  Palpations: Abdomen is soft.     Tenderness: There is abdominal tenderness in the right upper quadrant and left upper quadrant. There is guarding and rebound.  Musculoskeletal:        General: No swelling.     Cervical back: Neck supple.  Skin:    General: Skin is warm and dry.     Capillary Refill: Capillary refill takes less than 2 seconds.  Neurological:     Mental Status: She is alert.  Psychiatric:        Mood and Affect: Mood normal.     ED Results / Procedures / Treatments   Labs (all labs ordered are listed, but only abnormal results are displayed) Labs Reviewed  COMPREHENSIVE METABOLIC PANEL -  Abnormal; Notable for the following components:      Result Value   Glucose, Bld 147 (*)    AST 14 (*)    All other components within normal limits  CBC - Abnormal; Notable for the following components:   WBC 17.2 (*)    All other components within normal limits  URINALYSIS, ROUTINE W REFLEX MICROSCOPIC - Abnormal; Notable for the following components:   Specific Gravity, Urine 1.031 (*)    Hgb urine dipstick TRACE (*)    Ketones, ur 15 (*)    Protein, ur 30 (*)    Leukocytes,Ua MODERATE (*)    Bacteria, UA RARE (*)    All other components within normal limits  LIPASE, BLOOD    EKG EKG Interpretation  Date/Time:  Friday June 03 2022 14:56:58 EDT Ventricular Rate:  79 PR Interval:  170 QRS Duration: 85 QT Interval:  367 QTC Calculation: 421 R Axis:   58 Text Interpretation: Sinus rhythm Low voltage, precordial leads Borderline T abnormalities, inferior leads Borderline ST elevation, lateral leads No significant change was found Confirmed by Ezequiel Essex [phone]) on 06/03/2022 3:14:24 PM  Radiology CT ABDOMEN PELVIS W CONTRAST  Result Date: 06/03/2022 CLINICAL DATA:  Abdominal pain EXAM: CT ABDOMEN AND PELVIS WITH CONTRAST TECHNIQUE: Multidetector CT imaging of the abdomen and pelvis was performed using the standard protocol following bolus administration of intravenous contrast. RADIATION DOSE REDUCTION: This exam was performed according to the departmental dose-optimization program which includes automated exposure control, adjustment of the mA and/or kV according to patient size and/or use of iterative reconstruction technique. CONTRAST:  60mL OMNIPAQUE IOHEXOL 300 MG/ML  SOLN COMPARISON:  02/15/2021 FINDINGS: Lower chest: Imaged lung bases are clear. No pericardial or pleural effusion. Hepatobiliary: No focal liver abnormality is seen. Status post cholecystectomy. No biliary dilatation. Pancreas: Unremarkable. No pancreatic ductal dilatation or surrounding inflammatory changes.  Spleen: Normal in size without focal abnormality. Adrenals/Urinary Tract: No adrenal lesions are seen. There is a 1.8 cm mass right kidney the demonstrates soft tissue density. This corresponds to a dense lesion consistent with a proteinaceous or hemorrhagic cyst that was observed on the prior study and does not need further imaging follow up. Bilateral nephrolithiasis identified. The largest stone is 6 mm midpole on the right. On the left there is a 1.5 cm midpole cyst. No hydronephrosis. No stones in the ureters. Unremarkable urinary bladder. Stomach/Bowel: Diverticulosis of the sigmoid. There is peridiverticular fat stranding consistent with diverticulitis. No abscess or obstruction. No bowel dilatation. Appendix not identified and there is no evidence of appendicitis. Vascular/Lymphatic: Aortic atherosclerosis. No enlarged abdominal or pelvic lymph nodes. Reproductive: Status post hysterectomy. No adnexal masses. Other: No abdominal wall hernia or abnormality. No abdominopelvic ascites. Musculoskeletal: Thoracolumbosacral degenerative changes. IMPRESSION: 1.  Sigmoid diverticulitis.  No abscess or obstruction. 2. Bilateral nephrolithiasis without hydronephrosis. 3. Bilateral renal cysts. Electronically Signed   By: Sammie Bench M.D.   On: 06/03/2022 16:23    Procedures Procedures    Medications Ordered in ED Medications  HYDROmorphone (DILAUDID) injection 1 mg (1 mg Intravenous Given 06/03/22 1516)  sodium chloride 0.9 % bolus 1,000 mL (0 mLs Intravenous Stopped 06/03/22 1651)  ondansetron (ZOFRAN) injection 4 mg (4 mg Intravenous Given 06/03/22 1516)  piperacillin-tazobactam (ZOSYN) IVPB 3.375 g (0 g Intravenous Stopped 06/03/22 1651)  iohexol (OMNIPAQUE) 300 MG/ML solution 100 mL (80 mLs Intravenous Contrast Given 06/03/22 1548)    ED Course/ Medical Decision Making/ A&P                             Medical Decision Making Patient is a 72 year old female, here for severe abdominal pain, from  the left side radiating to the right back.  Endorses nausea, but no vomiting, no urinary symptoms or vaginal bleeding.  We obtained a CT abdomen pelvis, CBC CMP, for further evaluation and as well as an EKG to evaluate her QTc.  Will give her pain meds and fluids.  Amount and/or Complexity of Data Reviewed Labs: ordered.    Details: Leukocytosis this is a 17k+ Radiology: ordered.    Details: CT abdomen pelvis shows diverticulitis without perforation, and bilateral nephrolithiasis, and cyst. Discussion of management or test interpretation with external provider(s): Discussed with patient, found to have diverticulitis pain much better controlled, she is afebrile, nontachycardic, and is feeling better.  We discussed admission versus discharge home.  She would like to discharge home we will send her Augmentin, given her age comorbidities, and elevated white count, and send her home with some Norco for pain control.  Return precautions emphasized  Risk Prescription drug management.   Final Clinical Impression(s) / ED Diagnoses Final diagnoses:  Diverticulitis    Rx / DC Orders ED Discharge Orders          Ordered    amoxicillin-clavulanate (AUGMENTIN) 875-125 MG tablet  Every 12 hours        06/03/22 1659    HYDROcodone-acetaminophen (NORCO) 5-325 MG tablet  Every 6 hours PRN        06/03/22 1659              Meliah Appleman, [place], PA 06/03/22 1732    Ezequiel Essex, MD 06/04/22 1056

## 2022-06-03 NOTE — Discharge Instructions (Addendum)
Please follow-up with your primary care doctor, today you are found to have diverticulitis, without any perforation.  Take the antibiotic as prescribed make sure you are taking probiotics as needed with it.  Patient Hu prescribed a controlled substance called hydrocodone, this may impair your result increased falls take it sparingly and only for severe pain.  If you have worsening abdominal pain, intractable nausea, vomiting, please return to the ER.  Additionally since this is a narcotic you may need to use MiraLAX to help with your bowel movements as it can cause some constipation.

## 2022-06-03 NOTE — ED Triage Notes (Signed)
Pt reports right sided lower abdominal pain that radiates to her flank. Denies fevers or vomiting.

## 2022-06-03 NOTE — ED Notes (Signed)
Out to CT 

## 2022-06-22 NOTE — Progress Notes (Unsigned)
Cardiology Office Note Date:  06/23/2022  Patient ID:  Christine Navarro, DOB Nov 20, 1950, MRN [id number] PCP:  Lupita RaiderShaw, Kimberlee, MD  Electrophysiologist: Dr. Johney FrameAllred     Chief Complaint: overdue  History of Present Illness: Christine DeedDeborah Bostick Skalski is a 72 y.o. female with history of DM, HTN, stroke.  She saw Dr. Johney FrameAllred 04/27/20, reported some palpitations at that time, she had an ILR though removed 2/2 allergy. No AFib noted while she had it, none identified by her Apple watch.   Planned for monitoring with new palpitations.  Minimal ectopy, SVT/NSVT, no AFib  Saw Andy in f/u 06/09/20, palpitations were improved with less personal stressors.  Discussed up-titration of her BB, pt wanted to hold off.  TODAY She is doing well, though had some concerns of getting some low HRs at home. She checks her BP daily And HRs at times are in the 50's. BP largely 120's-140/70's or so, infrequently higher No CP, palpitations No SOB No near syncope or syncope. She is active, walks the [place] dog every day Cares for her home No exertional intolerances.   Past Medical History:  Diagnosis Date   Anxiety    Arthritis    Asthma    Basal cell adenocarcinoma    nose   Diabetes mellitus without complication    History of kidney stones    Hypertension    PONV (postoperative nausea and vomiting)    Sleep apnea    mild per pt-  no CPAP   Stroke (cerebrum)     Past Surgical History:  Procedure Laterality Date   ABDOMINAL HYSTERECTOMY     APPENDECTOMY     BREAST BIOPSY     BREAST EXCISIONAL BIOPSY     BREAST SURGERY     x 3   CHOLECYSTECTOMY     CYSTOSCOPY WITH RETROGRADE PYELOGRAM, URETEROSCOPY AND STENT PLACEMENT Right 12/23/2015   Procedure: CYSTOSCOPY WITH RETROGRADE PYELOGRAM, URETEROSCOPY AND STENT PLACEMENT;  Surgeon: Sebastian Acheheodore Manny, MD;  Location: [facility];  Service: Urology;  Laterality: Right;   DIAGNOSTIC LAPAROSCOPY     HOLMIUM LASER APPLICATION Right 12/23/2015    Procedure: HOLMIUM LASER APPLICATION;  Surgeon: Sebastian Acheheodore Manny, MD;  Location: [facility];  Service: Urology;  Laterality: Right;   loop recoder remove     LOOP RECORDER INSERTION N/A 11/02/2017   Procedure: LOOP RECORDER INSERTION;  Surgeon: Hillis RangeAllred, James, MD;  Location: [facility];  Service: Cardiovascular;  Laterality: N/A;   salpingectomy with oophorectomy     TEE WITHOUT CARDIOVERSION N/A 11/02/2017   Procedure: TRANSESOPHAGEAL ECHOCARDIOGRAM (TEE);  Surgeon: Pricilla Riffleoss, Paula V, MD;  Location: [facility];  Service: Cardiovascular;  Laterality: N/A;  loop recorder, bubble study    Current Outpatient Medications  Medication Sig Dispense Refill   cimetidine (TAGAMET) 400 MG tablet Take by mouth.     ketoconazole (NIZORAL) 2 % cream Apply topically.     clopidogrel (PLAVIX) 75 MG tablet Take 1 tablet (75 mg total) by mouth daily. 30 tablet 0   irbesartan (AVAPRO) 300 MG tablet Take 300 mg by mouth daily.     lansoprazole (PREVACID) 15 MG capsule Take 15 mg by mouth daily at 12 noon.     levocetirizine (XYZAL) 5 MG tablet TAKE 1 TABLET BY MOUTH EVERY DAY IN THE EVENING Oral for 90 Days     LORazepam (ATIVAN) 1 MG tablet Take 0.5 mg by mouth 2 (two) times daily as needed for anxiety or sleep.      metoprolol tartrate (  LOPRESSOR) 25 MG tablet Take 25 mg by mouth 2 (two) times daily. Half in the morning and I tablet at bedtime     oxybutynin (DITROPAN-XL) 5 MG 24 hr tablet Take 5 mg by mouth daily.     rosuvastatin (CRESTOR) 20 MG tablet Take 20 mg by mouth daily.     sodium chloride (OCEAN) 0.65 % nasal spray as directed Nasally as needed     trolamine salicylate (ASPERCREME) 10 % cream Apply 1 application topically daily as needed for muscle pain.     No current facility-administered medications for this visit.    Allergies:   Ace inhibitors, Ciprofloxacin, Sular [nisoldipine er], Vitamin d analogs, Welchol [colesevelam hcl], Erythromycin, Nickel, Sulfa antibiotics, and Tape   Social  History:  The patient  reports that she has never smoked. She has never used smokeless tobacco. She reports that she does not drink alcohol and does not use drugs.   Family History:  The patient's family history includes Breast cancer in her mother; Cancer in her mother; Heart disease in her father; Stroke in her paternal aunt.  ROS:  Please see the history of present illness.    All other systems are reviewed and otherwise negative.   PHYSICAL EXAM:  VS:  BP (!) 142/88   Pulse 76   Ht 5\' 2"  (1.575 m)   Wt 181 lb 9.6 oz (82.4 kg)   SpO2 97%   BMI 33.22 kg/m  BMI: Body mass index is 33.22 kg/m. Well nourished, well developed, in no acute distress HEENT: normocephalic, atraumatic Neck: no JVD, carotid bruits or masses Cardiac:  RRR; no significant murmurs, no rubs, or gallops Lungs:  CTA b/l, no wheezing, rhonchi or rales Abd: soft, nontender MS: no deformity or atrophy Ext: no edema Skin: warm and dry, no rash Neuro:  No gross deficits appreciated Psych: euthymic mood, full affect   EKG:  not done today 06/03/22: SR 70bpm, personally reviewed  March 2022, monitor Sinus rhythm Occasional premature atrial contractions and premature ventricular contractions 2 episodes of nonsustained ventricular tachycardia are noted (longest 9 beats) 5 SVT episodes are noted (longest 7 beats) which appear to be consistent with atrial tachycardia No atrial fibrillation   11/02/2017: TEE Study Conclusions  - Left atrium: No evidence of thrombus in the atrial cavity or    appendage.    10/01/2017: TTE Study Conclusions  - Left ventricle: The cavity size was normal. Wall thickness was    increased in a pattern of mild LVH. Systolic function was normal.    The estimated ejection fraction was in the range of 55% to 65%.    Although no diagnostic regional wall motion abnormality was    identified, this possibility cannot be completely excluded on the    basis of this study.   Impressions:   - No cardiac source of embolism was identified, but cannot be ruled    out on the basis of this examination.    Recent Labs: 06/03/2022: ALT 9; BUN 15; Creatinine, Ser 0.80; Hemoglobin 13.8; Platelets 235; Potassium 4.1; Sodium 136  No results found for requested labs within last 365 days.   Estimated Creatinine Clearance: 62.3 mL/min (by C-G formula based on SCr of 0.8 mg/dL).   Wt Readings from Last 3 Encounters:  06/23/22 181 lb 9.6 oz (82.4 kg)  06/09/20 184 lb 3.2 oz (83.6 kg)  04/27/20 185 lb (83.9 kg)     Other studies reviewed: Additional studies/records reviewed today include: summarized above  ASSESSMENT AND  PLAN:  Cryptogenic stroke Allergy to loop and removed No AFib by her watch, monitor no palpitations or symptoms to suggest arrhythmia Assured her HR 50's are OK, don't think we need to adjust her metoprolol  2. HTN Better at home  She would like to check in every couple years/and PRN, we discussed need to establish a new EP MD, will plan to transition to Dr. Lalla Brothers.   Disposition: F/u with [place] again in 2 years, sooner if needed   Current medicines are reviewed at length with the patient today.  The patient did not have any concerns regarding medicines.  Norma Fredrickson, PA-C 06/23/2022 9:25 AM     [facility] [address] [place] [address] [phone] (office)  [phone] (fax)

## 2022-06-23 ENCOUNTER — Encounter: Payer: Self-pay | Admitting: Physician Assistant

## 2022-06-23 ENCOUNTER — Ambulatory Visit: Payer: Medicare Other | Attending: Physician Assistant | Admitting: Physician Assistant

## 2022-06-23 VITALS — BP 142/88 | HR 76 | Ht 62.0 in | Wt 181.6 lb

## 2022-06-23 DIAGNOSIS — I639 Cerebral infarction, unspecified: Secondary | ICD-10-CM | POA: Insufficient documentation

## 2022-06-23 DIAGNOSIS — I10 Essential (primary) hypertension: Secondary | ICD-10-CM | POA: Insufficient documentation

## 2022-06-23 NOTE — Patient Instructions (Signed)
Medication Instructions:   Your physician recommends that you continue on your current medications as directed. Please refer to the Current Medication list given to you today.  *If you need a refill on your cardiac medications before your next appointment, please call your pharmacy*   Lab Work:  NONE ORDERED  TODAY   If you have labs (blood work) drawn today and your tests are completely normal, you will receive your results only by: MyChart Message (if you have MyChart) OR A paper copy in the mail If you have any lab test that is abnormal or we need to change your treatment, we will call you to review the results.   Testing/Procedures:  NONE ORDERED  TODAY    Follow-Up: At [facility], you and your health needs are our priority.  As part of our continuing mission to provide you with exceptional heart care, we have created designated Provider Care Teams.  These Care Teams include your primary Cardiologist (physician) and Advanced Practice Providers (APPs -  Physician Assistants and Nurse Practitioners) who all work together to provide you with the care you need, when you need it.  We recommend signing up for the patient portal called "MyChart".  Sign up information is provided on this After Visit Summary.  MyChart is used to connect with patients for Virtual Visits (Telemedicine).  Patients are able to view lab/test results, encounter notes, upcoming appointments, etc.  Non-urgent messages can be sent to your provider as well.   To learn more about what you can do with MyChart, go to [email or web address].    Your next appointment:   2 year(s)  Provider:   Steffanie Dunn, MD    Other Instructions

## 2022-06-27 DIAGNOSIS — D225 Melanocytic nevi of trunk: Secondary | ICD-10-CM | POA: Diagnosis not present

## 2022-06-27 DIAGNOSIS — C44319 Basal cell carcinoma of skin of other parts of face: Secondary | ICD-10-CM | POA: Diagnosis not present

## 2022-06-27 DIAGNOSIS — X32XXXD Exposure to sunlight, subsequent encounter: Secondary | ICD-10-CM | POA: Diagnosis not present

## 2022-06-27 DIAGNOSIS — L820 Inflamed seborrheic keratosis: Secondary | ICD-10-CM | POA: Diagnosis not present

## 2022-06-27 DIAGNOSIS — L570 Actinic keratosis: Secondary | ICD-10-CM | POA: Diagnosis not present

## 2022-07-19 DIAGNOSIS — G4733 Obstructive sleep apnea (adult) (pediatric): Secondary | ICD-10-CM | POA: Diagnosis not present

## 2022-07-19 DIAGNOSIS — E1169 Type 2 diabetes mellitus with other specified complication: Secondary | ICD-10-CM | POA: Diagnosis not present

## 2022-07-29 DIAGNOSIS — H40013 Open angle with borderline findings, low risk, bilateral: Secondary | ICD-10-CM | POA: Diagnosis not present

## 2022-08-01 DIAGNOSIS — Z7901 Long term (current) use of anticoagulants: Secondary | ICD-10-CM | POA: Diagnosis not present

## 2022-08-01 DIAGNOSIS — Z8719 Personal history of other diseases of the digestive system: Secondary | ICD-10-CM | POA: Diagnosis not present

## 2022-08-01 DIAGNOSIS — Z86010 Personal history of colonic polyps: Secondary | ICD-10-CM | POA: Diagnosis not present

## 2022-08-08 DIAGNOSIS — M546 Pain in thoracic spine: Secondary | ICD-10-CM | POA: Diagnosis not present

## 2022-08-08 DIAGNOSIS — M62830 Muscle spasm of back: Secondary | ICD-10-CM | POA: Diagnosis not present

## 2022-08-11 DIAGNOSIS — Z08 Encounter for follow-up examination after completed treatment for malignant neoplasm: Secondary | ICD-10-CM | POA: Diagnosis not present

## 2022-08-11 DIAGNOSIS — Z85828 Personal history of other malignant neoplasm of skin: Secondary | ICD-10-CM | POA: Diagnosis not present

## 2022-08-25 DIAGNOSIS — K5732 Diverticulitis of large intestine without perforation or abscess without bleeding: Secondary | ICD-10-CM | POA: Diagnosis not present

## 2022-08-25 DIAGNOSIS — K5730 Diverticulosis of large intestine without perforation or abscess without bleeding: Secondary | ICD-10-CM | POA: Diagnosis not present

## 2022-08-25 DIAGNOSIS — Z86010 Personal history of colonic polyps: Secondary | ICD-10-CM | POA: Diagnosis not present

## 2022-08-25 DIAGNOSIS — Z09 Encounter for follow-up examination after completed treatment for conditions other than malignant neoplasm: Secondary | ICD-10-CM | POA: Diagnosis not present

## 2022-11-29 DIAGNOSIS — I10 Essential (primary) hypertension: Secondary | ICD-10-CM | POA: Diagnosis not present

## 2022-11-29 DIAGNOSIS — E782 Mixed hyperlipidemia: Secondary | ICD-10-CM | POA: Diagnosis not present

## 2022-11-29 DIAGNOSIS — N181 Chronic kidney disease, stage 1: Secondary | ICD-10-CM | POA: Diagnosis not present

## 2022-11-29 DIAGNOSIS — E669 Obesity, unspecified: Secondary | ICD-10-CM | POA: Diagnosis not present

## 2022-11-29 DIAGNOSIS — E1122 Type 2 diabetes mellitus with diabetic chronic kidney disease: Secondary | ICD-10-CM | POA: Diagnosis not present

## 2023-01-16 ENCOUNTER — Other Ambulatory Visit: Payer: Self-pay | Admitting: Family Medicine

## 2023-01-16 DIAGNOSIS — Z1231 Encounter for screening mammogram for malignant neoplasm of breast: Secondary | ICD-10-CM

## 2023-01-18 ENCOUNTER — Ambulatory Visit
Admission: RE | Admit: 2023-01-18 | Discharge: 2023-01-18 | Disposition: A | Discharge status: Home / Self Care | Payer: Medicare Other | Source: Ambulatory Visit | Attending: Family Medicine | Admitting: Family Medicine

## 2023-01-18 DIAGNOSIS — Z1231 Encounter for screening mammogram for malignant neoplasm of breast: Secondary | ICD-10-CM

## 2023-01-30 DIAGNOSIS — E113292 Type 2 diabetes mellitus with mild nonproliferative diabetic retinopathy without macular edema, left eye: Secondary | ICD-10-CM | POA: Diagnosis not present

## 2023-02-02 DIAGNOSIS — Z08 Encounter for follow-up examination after completed treatment for malignant neoplasm: Secondary | ICD-10-CM | POA: Diagnosis not present

## 2023-02-02 DIAGNOSIS — D225 Melanocytic nevi of trunk: Secondary | ICD-10-CM | POA: Diagnosis not present

## 2023-02-02 DIAGNOSIS — Z85828 Personal history of other malignant neoplasm of skin: Secondary | ICD-10-CM | POA: Diagnosis not present

## 2023-02-13 DIAGNOSIS — N200 Calculus of kidney: Secondary | ICD-10-CM | POA: Diagnosis not present

## 2023-02-13 DIAGNOSIS — N281 Cyst of kidney, acquired: Secondary | ICD-10-CM | POA: Diagnosis not present

## 2023-03-23 DIAGNOSIS — D225 Melanocytic nevi of trunk: Secondary | ICD-10-CM | POA: Diagnosis not present

## 2023-03-23 DIAGNOSIS — Z08 Encounter for follow-up examination after completed treatment for malignant neoplasm: Secondary | ICD-10-CM | POA: Diagnosis not present

## 2023-03-23 DIAGNOSIS — Z1283 Encounter for screening for malignant neoplasm of skin: Secondary | ICD-10-CM | POA: Diagnosis not present

## 2023-03-23 DIAGNOSIS — Z85828 Personal history of other malignant neoplasm of skin: Secondary | ICD-10-CM | POA: Diagnosis not present

## 2023-03-23 DIAGNOSIS — L308 Other specified dermatitis: Secondary | ICD-10-CM | POA: Diagnosis not present

## 2023-04-04 DIAGNOSIS — J019 Acute sinusitis, unspecified: Secondary | ICD-10-CM | POA: Diagnosis not present

## 2023-04-04 DIAGNOSIS — R399 Unspecified symptoms and signs involving the genitourinary system: Secondary | ICD-10-CM | POA: Diagnosis not present

## 2023-04-04 DIAGNOSIS — N390 Urinary tract infection, site not specified: Secondary | ICD-10-CM | POA: Diagnosis not present

## 2023-04-20 DIAGNOSIS — Z23 Encounter for immunization: Secondary | ICD-10-CM | POA: Diagnosis not present

## 2023-05-02 DIAGNOSIS — N390 Urinary tract infection, site not specified: Secondary | ICD-10-CM | POA: Diagnosis not present

## 2023-05-02 DIAGNOSIS — R399 Unspecified symptoms and signs involving the genitourinary system: Secondary | ICD-10-CM | POA: Diagnosis not present

## 2023-05-15 DIAGNOSIS — J3089 Other allergic rhinitis: Secondary | ICD-10-CM | POA: Diagnosis not present

## 2023-05-15 DIAGNOSIS — L501 Idiopathic urticaria: Secondary | ICD-10-CM | POA: Diagnosis not present

## 2023-05-15 DIAGNOSIS — J301 Allergic rhinitis due to pollen: Secondary | ICD-10-CM | POA: Diagnosis not present

## 2023-05-31 DIAGNOSIS — I69320 Aphasia following cerebral infarction: Secondary | ICD-10-CM | POA: Diagnosis not present

## 2023-05-31 DIAGNOSIS — I519 Heart disease, unspecified: Secondary | ICD-10-CM | POA: Diagnosis not present

## 2023-05-31 DIAGNOSIS — Z0000 Encounter for general adult medical examination without abnormal findings: Secondary | ICD-10-CM | POA: Diagnosis not present

## 2023-05-31 DIAGNOSIS — R011 Cardiac murmur, unspecified: Secondary | ICD-10-CM | POA: Diagnosis not present

## 2023-05-31 DIAGNOSIS — F411 Generalized anxiety disorder: Secondary | ICD-10-CM | POA: Diagnosis not present

## 2023-05-31 DIAGNOSIS — I517 Cardiomegaly: Secondary | ICD-10-CM | POA: Diagnosis not present

## 2023-05-31 DIAGNOSIS — E7800 Pure hypercholesterolemia, unspecified: Secondary | ICD-10-CM | POA: Diagnosis not present

## 2023-05-31 DIAGNOSIS — N3281 Overactive bladder: Secondary | ICD-10-CM | POA: Diagnosis not present

## 2023-05-31 DIAGNOSIS — E1122 Type 2 diabetes mellitus with diabetic chronic kidney disease: Secondary | ICD-10-CM | POA: Diagnosis not present

## 2023-05-31 DIAGNOSIS — E559 Vitamin D deficiency, unspecified: Secondary | ICD-10-CM | POA: Diagnosis not present

## 2023-05-31 DIAGNOSIS — E669 Obesity, unspecified: Secondary | ICD-10-CM | POA: Diagnosis not present

## 2023-05-31 DIAGNOSIS — I10 Essential (primary) hypertension: Secondary | ICD-10-CM | POA: Diagnosis not present

## 2023-05-31 DIAGNOSIS — N181 Chronic kidney disease, stage 1: Secondary | ICD-10-CM | POA: Diagnosis not present

## 2023-07-05 DIAGNOSIS — R011 Cardiac murmur, unspecified: Secondary | ICD-10-CM | POA: Diagnosis not present

## 2023-07-25 DIAGNOSIS — G4733 Obstructive sleep apnea (adult) (pediatric): Secondary | ICD-10-CM | POA: Diagnosis not present

## 2023-07-25 DIAGNOSIS — R040 Epistaxis: Secondary | ICD-10-CM | POA: Diagnosis not present

## 2023-07-31 DIAGNOSIS — H40013 Open angle with borderline findings, low risk, bilateral: Secondary | ICD-10-CM | POA: Diagnosis not present

## 2023-08-02 DIAGNOSIS — R399 Unspecified symptoms and signs involving the genitourinary system: Secondary | ICD-10-CM | POA: Diagnosis not present

## 2023-08-02 DIAGNOSIS — R350 Frequency of micturition: Secondary | ICD-10-CM | POA: Diagnosis not present

## 2023-10-05 DIAGNOSIS — N390 Urinary tract infection, site not specified: Secondary | ICD-10-CM | POA: Diagnosis not present

## 2023-10-18 DIAGNOSIS — R102 Pelvic and perineal pain: Secondary | ICD-10-CM | POA: Diagnosis not present

## 2023-10-24 DIAGNOSIS — N200 Calculus of kidney: Secondary | ICD-10-CM | POA: Diagnosis not present

## 2023-10-24 DIAGNOSIS — N281 Cyst of kidney, acquired: Secondary | ICD-10-CM | POA: Diagnosis not present

## 2023-10-25 ENCOUNTER — Other Ambulatory Visit: Payer: Self-pay | Admitting: Urology

## 2023-10-31 DIAGNOSIS — G4733 Obstructive sleep apnea (adult) (pediatric): Secondary | ICD-10-CM | POA: Diagnosis not present

## 2023-11-28 NOTE — Progress Notes (Signed)
 COVID Vaccine received:  []  No [x]  Yes Date of any COVID positive Test in last 90 days:  PCP - Joen Gentry, MD  Cardiologist -  EP- (Dr. Lynwood Rakers)   Chest x-ray -  EKG - 05-2022   will repeat  Stress Test -  ECHO - 11-02-2017  Epic Cardiac Cath -  CT Coronary Calcium  score:  ZIO monitor- 05-20-2020  Epic  Bowel Prep - [x]  No  []   Yes ______  Pacemaker / ICD device [x]  No []  Yes   Spinal Cord Stimulator:[x]  No []  Yes       History of Sleep Apnea? []  No [x]  Yes   CPAP used?- []  No []  Yes    Patient has: []  NO Hx DM   [x]  Pre-DM   []  DM1  []   DM2 Does the patient monitor blood sugar?   []  N/A   [x]  No []  Yes  Last A1c was:        on       Blood Thinner / Instructions: Plavix   hold x  Aspirin  Instructions:  none  Dental hx: []  Dentures:  []  N/A      []  Bridge or Partial:                   []  Loose or Damaged teeth:   Comments:   Activity level: Able to walk up 2 flights of stairs without becoming significantly short of breath or having chest pain?  []  No   []    Yes  Patient can perform ADLs without assistance. []  No   []   Yes  Anesthesia review: Pre-DM, anxiety, HTN, Hx cryptogenic CVA, OSA-    PONV  Patient denies any S&S of respiratory illness or Covid - no shortness of breath, fever, cough or chest pain at PAT appointment.  Patient verbalized understanding and agreement to the Pre-Surgical Instructions that were given to them at this PAT appointment. Patient was also educated of the need to review these PAT instructions again prior to  her surgery.I reviewed the appropriate phone numbers to call if they have any and questions or concerns.

## 2023-11-28 NOTE — Patient Instructions (Signed)
 SURGICAL WAITING ROOM VISITATION Patients having surgery or a procedure may have no more than 2 support people in the waiting area - these visitors may rotate in the visitor waiting room.   If the patient needs to stay at the hospital during part of their recovery, the visitor guidelines for inpatient rooms apply.  PRE-OP VISITATION  Pre-op nurse will coordinate an appropriate time for 1 support person to accompany the patient in pre-op.  This support person may not rotate.  This visitor will be contacted when the time is appropriate for the visitor to come back in the pre-op area.  Please refer to the [facility] website for the visitor guidelines for Inpatients (after your surgery is over and you are in a regular room).  You are not required to quarantine at this time prior to your surgery. However, you must do this: Hand Hygiene often Do NOT share personal items Notify your provider if you are in close contact with someone who has COVID or you develop fever 100.4 or greater, new onset of sneezing, cough, sore throat, shortness of breath or body aches.  If you test positive for Covid or have been in contact with anyone that has tested positive in the last 10 days please notify you surgeon.    Your procedure is scheduled on:  Wednesday  12-06-2023  Report to [facility] Main Entrance: [place] entrance where the [employer] is available.   Report to admitting at: 05:15    AM  Call this number if you have any questions or problems the morning of surgery [phone]  DO NOT EAT OR DRINK ANYTHING AFTER MIDNIGHT THE NIGHT PRIOR TO YOUR SURGERY / PROCEDURE.   FOLLOW  ANY ADDITIONAL PRE OP INSTRUCTIONS YOU RECEIVED FROM YOUR SURGEON'S OFFICE!!!   Oral Hygiene is also important to reduce your risk of infection.        Remember - BRUSH YOUR TEETH THE MORNING OF SURGERY WITH YOUR REGULAR TOOTHPASTE  Do NOT smoke after Midnight the night before surgery.  PLAVIX -  Last dose will be  on Thursday  Sept. 18, 2025  STOP TAKING all Vitamins, Herbs and supplements 1 week before your surgery.   Take ONLY these medicines the morning of surgery with A SIP OF WATER:  oxybutynin , metoprolol, and Tylenol  if needed for pain. You may use your nasal spray if needed.   DO NOT TAKE Irbesartan or Spironolactone the morning of your surgery.   You may not have any metal on your body including hair pins, jewelry, and body piercing  Do not wear make-up, lotions, powders, perfumes  or deodorant  Do not wear nail polish including gel and S&S, artificial / acrylic nails, or any other type of covering on natural nails including finger and toenails. If you have artificial nails, gel coating, etc., that needs to be removed by a nail salon, Please have this removed prior to surgery. Not doing so may mean that your surgery could be cancelled or delayed if the Surgeon or anesthesia staff feels like they are unable to monitor you safely.   Do not shave 48 hours prior to surgery to avoid nicks in your skin which may contribute to postoperative infections.    Contacts, Hearing Aids, dentures or bridgework may not be worn into surgery. DENTURES WILL BE REMOVED PRIOR TO SURGERY PLEASE DO NOT APPLY Poly grip OR ADHESIVES!!!  You may bring a small overnight bag with you on the day of surgery, only pack items that are  not valuable. [facility] IS NOT RESPONSIBLE   FOR VALUABLES THAT ARE LOST OR STOLEN.   Patients discharged on the day of surgery will not be allowed to drive home.  Someone NEEDS to stay with you for the first 24 hours after anesthesia.  Do not bring your home medications to the hospital. The Pharmacy will dispense medications listed on your medication list to you during your admission in the Hospital.  Special Instructions: Bring a copy of your healthcare power of attorney and living will documents the day of surgery, if you wish to have them scanned into your [facility] Records-  EPIC  Please read over the following fact sheets you were given: IF YOU HAVE QUESTIONS ABOUT YOUR PRE-OP INSTRUCTIONS, PLEASE CALL [phone].   [facility] - Preparing for Surgery Before surgery, you can play an important role.  Because skin is not sterile, your skin needs to be as free of germs as possible.  You can reduce the number of germs on your skin by washing with CHG (chlorahexidine gluconate) soap before surgery.  CHG is an antiseptic cleaner which kills germs and bonds with the skin to continue killing germs even after washing. Please DO NOT use if you have an allergy to CHG or antibacterial soaps.  If your skin becomes reddened/irritated stop using the CHG and inform your nurse when you arrive at Short Stay. Do not shave (including legs and underarms) for at least 48 hours prior to the first CHG shower.  You may shave your face/neck.  Please follow these instructions carefully:  1.  Shower with CHG Soap the night before surgery and the  morning of surgery.  2.  If you choose to wash your hair, wash your hair first as usual with your normal  shampoo.  3.  After you shampoo, rinse your hair and body thoroughly to remove the shampoo.                             4.  Use CHG as you would any other liquid soap.  You can apply chg directly to the skin and wash.  Gently with a scrungie or clean washcloth.  5.  Apply the CHG Soap to your body ONLY FROM THE NECK DOWN.   Do not use on face/ open                           Wound or open sores. Avoid contact with eyes, ears mouth and genitals (private parts).                       Wash face,  Genitals (private parts) with your normal soap.             6.  Wash thoroughly, paying special attention to the area where your  surgery  will be performed.  7.  Thoroughly rinse your body with warm water from the neck down.  8.  DO NOT shower/wash with your normal soap after using and rinsing off the CHG Soap.            9.  Pat yourself dry with a clean  towel.            10.  Wear clean pajamas.            11.  Place clean sheets on your bed the night of your first shower and  do not  sleep with pets.  ON THE DAY OF SURGERY : Do not apply any lotions/deodorants the morning of surgery.  Please wear clean clothes to the hospital/surgery center.    FAILURE TO FOLLOW THESE INSTRUCTIONS MAY RESULT IN THE CANCELLATION OF YOUR SURGERY  PATIENT SIGNATURE_________________________________  NURSE SIGNATURE__________________________________  ________________________________________________________________________

## 2023-11-29 ENCOUNTER — Other Ambulatory Visit: Payer: Self-pay

## 2023-11-29 ENCOUNTER — Encounter (HOSPITAL_COMMUNITY)
Admission: RE | Admit: 2023-11-29 | Discharge: 2023-11-29 | Disposition: A | Discharge status: Home / Self Care | Source: Ambulatory Visit | Attending: Urology | Admitting: Urology

## 2023-11-29 ENCOUNTER — Encounter (HOSPITAL_COMMUNITY): Payer: Self-pay

## 2023-11-29 VITALS — BP 134/68 | HR 72 | Temp 98.9°F | Resp 20 | Ht 62.0 in | Wt 177.0 lb

## 2023-11-29 DIAGNOSIS — G4733 Obstructive sleep apnea (adult) (pediatric): Secondary | ICD-10-CM | POA: Insufficient documentation

## 2023-11-29 DIAGNOSIS — N202 Calculus of kidney with calculus of ureter: Secondary | ICD-10-CM | POA: Diagnosis not present

## 2023-11-29 DIAGNOSIS — Z8673 Personal history of transient ischemic attack (TIA), and cerebral infarction without residual deficits: Secondary | ICD-10-CM | POA: Diagnosis not present

## 2023-11-29 DIAGNOSIS — J45909 Unspecified asthma, uncomplicated: Secondary | ICD-10-CM | POA: Diagnosis not present

## 2023-11-29 DIAGNOSIS — I10 Essential (primary) hypertension: Secondary | ICD-10-CM | POA: Diagnosis not present

## 2023-11-29 DIAGNOSIS — R7303 Prediabetes: Secondary | ICD-10-CM | POA: Diagnosis not present

## 2023-11-29 DIAGNOSIS — Z7902 Long term (current) use of antithrombotics/antiplatelets: Secondary | ICD-10-CM | POA: Diagnosis not present

## 2023-11-29 DIAGNOSIS — I350 Nonrheumatic aortic (valve) stenosis: Secondary | ICD-10-CM | POA: Insufficient documentation

## 2023-11-29 DIAGNOSIS — Z01818 Encounter for other preprocedural examination: Secondary | ICD-10-CM | POA: Diagnosis not present

## 2023-11-29 HISTORY — DX: Gastro-esophageal reflux disease without esophagitis: K21.9

## 2023-11-29 HISTORY — DX: Prediabetes: R73.03

## 2023-11-29 HISTORY — DX: Cardiac murmur, unspecified: R01.1

## 2023-11-29 LAB — BASIC METABOLIC PANEL WITH GFR
Anion gap: 12 (ref 5–15)
BUN: 23 mg/dL (ref 8–23)
CO2: 22 mmol/L (ref 22–32)
Calcium: 9.5 mg/dL (ref 8.9–10.3)
Chloride: 108 mmol/L (ref 98–111)
Creatinine, Ser: 0.86 mg/dL (ref 0.44–1.00)
GFR, Estimated: >60 mL/min (ref >60)
Glucose, Bld: 122 mg/dL — ABNORMAL HIGH (ref 70–99)
Potassium: 4.5 mmol/L (ref 3.5–5.1)
Sodium: 142 mmol/L (ref 135–145)

## 2023-11-29 LAB — CBC
HCT: 40.6 % (ref 36.0–46.0)
Hemoglobin: 12.1 g/dL (ref 12.0–15.0)
MCH: 28.5 pg (ref 26.0–34.0)
MCHC: 29.8 g/dL — ABNORMAL LOW (ref 30.0–36.0)
MCV: 95.8 fL (ref 80.0–100.0)
Platelets: 215 K/uL (ref 150–400)
RBC: 4.24 MIL/uL (ref 3.87–5.11)
RDW: 13.7 % (ref 11.5–15.5)
WBC: 9 K/uL (ref 4.0–10.5)
nRBC: 0 % (ref 0.0–0.2)

## 2023-11-30 ENCOUNTER — Encounter (HOSPITAL_COMMUNITY): Payer: Self-pay

## 2023-11-30 NOTE — Progress Notes (Signed)
 Case: [id number] Date/Time: 12/06/23 0715   Procedures:      CYSTOSCOPY/URETEROSCOPY/HOLMIUM LASER/STENT PLACEMENT (Bilateral)     CYSTOSCOPY, WITH RETROGRADE PYELOGRAM (Bilateral)   Anesthesia type: General   Diagnosis: Calculus of kidney and ureter [N20.2]   Pre-op diagnosis: BILATERAL RENAL STONES   Location: [facility] / [facility]   Surgeons: Alvaro Ricardo KATHEE Mickey., MD       DISCUSSION: Christine [facility] is a 73 year old female with past medical history of HTN, mild aortic stenosis, asthma, OSA on CPAP, cryptogenic stroke (09/2017), GERD, prediabetes  Prior anesthesia complications include PONV  Patient diagnosed with cryptogenic CVA in 2019 when she presented with left-sided weakness.  All symptoms have resolved.  She followed up with cardiology and had a loop recorder placed which did not show any arrhythmias. She is on Plavix .  Seen by PCP in April 2025 and heart murmur noted on exam. Echo obtained (copy scanned in media on 11/30/23) showing normal LVEF 56%, grade I DD, mild LVH, mild aortic stenosis with mean gradient of 9.8 mmHg.  LD Plavix : 9/18  VS: BP 134/68 Comment: right arm sitting  Pulse 72   Temp 37.2 C (Oral)   Resp 20   Ht 5' 2 (1.575 m)   Wt 80.3 kg   SpO2 100%   BMI 32.37 kg/m   PROVIDERS: Loreli Kins, MD   LABS: Labs reviewed: Acceptable for surgery. (all labs ordered are listed, but only abnormal results are displayed)  Labs Reviewed  BASIC METABOLIC PANEL WITH GFR - Abnormal; Notable for the following components:      Result Value   Glucose, Bld 122 (*)    All other components within normal limits  CBC - Abnormal; Notable for the following components:   MCHC 29.8 (*)    All other components within normal limits     IMAGES:   EKG 11/29/23:  Normal sinus rhythm Minimal voltage criteria for LVH, may be normal variant ( R in aVL ) Anterolateral infarct , age undetermined Abnormal ECG When compared with ECG of 03-Jun-2022  14:56, decreased R wave voltage across the precordium is now present  CV:  Echo 07/05/23 (report from [facility] scanned in media on 9/18)  Conclusions:  Left ventricle cavity is normal in size Mild concentric hypertrophy of the left ventricle Normal global wall motion Doppler evidence of grade 1 (impaired) diastolic dysfunction Calculated EF 56% Left atrial cavity is mildly dilated Trileaflet aortic valve with no regurgitation Mild aortic valve leaflet calcification Mild aortic stenosis Structurally normal tricuspid valve with trace regurgitation No evidence of pulmonary hypertension  Past Medical History:  Diagnosis Date   Anxiety    Arthritis    Asthma    Basal cell adenocarcinoma    multiple lesion   GERD (gastroesophageal reflux disease)    Heart murmur    as a child   History of kidney stones    Hypertension    PONV (postoperative nausea and vomiting)    Pre-diabetes    Sleep apnea    mild per pt-  no CPAP   Stroke (cerebrum) (HCC)     Past Surgical History:  Procedure Laterality Date   ABDOMINAL HYSTERECTOMY     APPENDECTOMY     BREAST BIOPSY Left    BREAST EXCISIONAL BIOPSY Left    BREAST SURGERY     x 3   CHOLECYSTECTOMY     open   CYSTOSCOPY WITH RETROGRADE PYELOGRAM, URETEROSCOPY AND STENT PLACEMENT Right 12/23/2015   Procedure: CYSTOSCOPY WITH RETROGRADE  PYELOGRAM, URETEROSCOPY AND STENT PLACEMENT;  Surgeon: Ricardo Likens, MD;  Location: [facility];  Service: Urology;  Laterality: Right;   DIAGNOSTIC LAPAROSCOPY     HOLMIUM LASER APPLICATION Right 12/23/2015   Procedure: HOLMIUM LASER APPLICATION;  Surgeon: Ricardo Likens, MD;  Location: [facility];  Service: Urology;  Laterality: Right;   loop recoder remove  2019   reaction to nickel   LOOP RECORDER INSERTION N/A 11/02/2017   Procedure: LOOP RECORDER INSERTION;  Surgeon: Kelsie Agent, MD;  Location: [facility];  Service: Cardiovascular;  Laterality: N/A;   salpingectomy with oophorectomy     TEE  WITHOUT CARDIOVERSION N/A 11/02/2017   Procedure: TRANSESOPHAGEAL ECHOCARDIOGRAM (TEE);  Surgeon: Okey Vina GAILS, MD;  Location: [facility] ENDOSCOPY;  Service: Cardiovascular;  Laterality: N/A;  loop recorder, bubble study    MEDICATIONS:  acetaminophen  (TYLENOL ) 500 MG tablet   calcium  elemental as carbonate (TUMS ULTRA 1000) 400 MG chewable tablet   clopidogrel  (PLAVIX ) 75 MG tablet   irbesartan (AVAPRO) 300 MG tablet   lansoprazole (PREVACID) 15 MG capsule   levocetirizine (XYZAL) 5 MG tablet   LORazepam  (ATIVAN ) 1 MG tablet   metoprolol tartrate (LOPRESSOR) 25 MG tablet   oxybutynin  (DITROPAN -XL) 5 MG 24 hr tablet   PRESCRIPTION MEDICATION   rosuvastatin (CRESTOR) 20 MG tablet   sodium chloride  (BRONCHO SALINE ) inhaler solution   sodium chloride  (OCEAN) 0.65 % nasal spray   spironolactone (ALDACTONE) 25 MG tablet   trolamine salicylate (ASPERCREME) 10 % cream   No current facility-administered medications for this encounter.   Christine [facility] Short Stay/Anesthesiology [facility] Phone [phone] 11/30/2023 1:14 PM

## 2023-12-05 DIAGNOSIS — E559 Vitamin D deficiency, unspecified: Secondary | ICD-10-CM | POA: Diagnosis not present

## 2023-12-05 DIAGNOSIS — E782 Mixed hyperlipidemia: Secondary | ICD-10-CM | POA: Diagnosis not present

## 2023-12-05 DIAGNOSIS — F411 Generalized anxiety disorder: Secondary | ICD-10-CM | POA: Diagnosis not present

## 2023-12-05 DIAGNOSIS — N200 Calculus of kidney: Secondary | ICD-10-CM | POA: Diagnosis not present

## 2023-12-05 DIAGNOSIS — E669 Obesity, unspecified: Secondary | ICD-10-CM | POA: Diagnosis not present

## 2023-12-05 DIAGNOSIS — Z8673 Personal history of transient ischemic attack (TIA), and cerebral infarction without residual deficits: Secondary | ICD-10-CM | POA: Diagnosis not present

## 2023-12-05 DIAGNOSIS — E1122 Type 2 diabetes mellitus with diabetic chronic kidney disease: Secondary | ICD-10-CM | POA: Diagnosis not present

## 2023-12-05 DIAGNOSIS — N181 Chronic kidney disease, stage 1: Secondary | ICD-10-CM | POA: Diagnosis not present

## 2023-12-05 DIAGNOSIS — I10 Essential (primary) hypertension: Secondary | ICD-10-CM | POA: Diagnosis not present

## 2023-12-05 MED ORDER — DEXTROSE 5 % IV SOLN
5.0000 mg/kg | INTRAVENOUS | Status: AC
  Administered 2023-12-06: 311.2 mg via INTRAVENOUS
  Filled 2023-12-05: qty 7.75

## 2023-12-05 NOTE — Anesthesia Preprocedure Evaluation (Addendum)
 Anesthesia Evaluation  Patient identified by MRN, date of birth, ID band Patient awake    Reviewed: Allergy & Precautions, NPO status , Patient's Chart, lab work & pertinent test results, reviewed documented beta blocker date and time   History of Anesthesia Complications (+) PONV  Airway Mallampati: II  TM Distance: >3 FB Neck ROM: Full    Dental  (+) Dental Advisory Given   Pulmonary sleep apnea (does not require CPAP) and Continuous Positive Airway Pressure Ventilation    breath sounds clear to auscultation       Cardiovascular hypertension, Pt. on medications and Pt. on home beta blockers (-) angina  Rhythm:Regular Rate:Normal  '19 ECHO: EF 55-60%, normal LVF, mild LVH, normal RVF, no significant valvular abnormalities   Neuro/Psych   Anxiety     CVA (L sided weakness), Residual Symptoms    GI/Hepatic Neg liver ROS,GERD  Medicated and Controlled,,  Endo/Other  diabetes (diet controlled, glu 132)  BMI 32  Renal/GU stones     Musculoskeletal   Abdominal   Peds  Hematology Plavix  Hb 12.1, plt 215k   Anesthesia Other Findings   Reproductive/Obstetrics                              Anesthesia Physical Anesthesia Plan  ASA: 3  Anesthesia Plan: General   Post-op Pain Management: Tylenol  PO (pre-op)*   Induction: Intravenous  PONV Risk Score and Plan: 4 or greater and Ondansetron , Dexamethasone , Treatment may vary due to age or medical condition and TIVA  Airway Management Planned: LMA  Additional Equipment: None  Intra-op Plan:   Post-operative Plan:   Informed Consent: I have reviewed the patients History and Physical, chart, labs and discussed the procedure including the risks, benefits and alternatives for the proposed anesthesia with the patient or authorized representative who has indicated his/her understanding and acceptance.     Dental advisory given  Plan Discussed  with: CRNA and Surgeon  Anesthesia Plan Comments: (Discussed with pt and her daughter)         Anesthesia Quick Evaluation

## 2023-12-06 ENCOUNTER — Other Ambulatory Visit: Payer: Self-pay

## 2023-12-06 ENCOUNTER — Ambulatory Visit (HOSPITAL_COMMUNITY)
Admission: RE | Admit: 2023-12-06 | Discharge: 2023-12-06 | Disposition: A | Discharge status: Home / Self Care | Attending: Urology | Admitting: Urology

## 2023-12-06 ENCOUNTER — Encounter (HOSPITAL_COMMUNITY)
Admission: RE | Disposition: A | Discharge status: Home / Self Care | Payer: Self-pay | Source: Home / Self Care | Attending: Urology

## 2023-12-06 ENCOUNTER — Encounter (HOSPITAL_COMMUNITY): Payer: Self-pay | Admitting: Urology

## 2023-12-06 ENCOUNTER — Ambulatory Visit (HOSPITAL_COMMUNITY)

## 2023-12-06 ENCOUNTER — Ambulatory Visit (HOSPITAL_BASED_OUTPATIENT_CLINIC_OR_DEPARTMENT_OTHER): Payer: Self-pay | Admitting: Anesthesiology

## 2023-12-06 ENCOUNTER — Ambulatory Visit (HOSPITAL_COMMUNITY): Payer: Self-pay | Admitting: Medical

## 2023-12-06 DIAGNOSIS — G4730 Sleep apnea, unspecified: Secondary | ICD-10-CM | POA: Diagnosis not present

## 2023-12-06 DIAGNOSIS — N202 Calculus of kidney with calculus of ureter: Secondary | ICD-10-CM | POA: Diagnosis present

## 2023-12-06 DIAGNOSIS — I69354 Hemiplegia and hemiparesis following cerebral infarction affecting left non-dominant side: Secondary | ICD-10-CM | POA: Diagnosis not present

## 2023-12-06 DIAGNOSIS — Z6832 Body mass index (BMI) 32.0-32.9, adult: Secondary | ICD-10-CM | POA: Diagnosis not present

## 2023-12-06 DIAGNOSIS — E669 Obesity, unspecified: Secondary | ICD-10-CM | POA: Diagnosis not present

## 2023-12-06 DIAGNOSIS — Z7902 Long term (current) use of antithrombotics/antiplatelets: Secondary | ICD-10-CM | POA: Diagnosis not present

## 2023-12-06 DIAGNOSIS — N200 Calculus of kidney: Secondary | ICD-10-CM | POA: Insufficient documentation

## 2023-12-06 DIAGNOSIS — F419 Anxiety disorder, unspecified: Secondary | ICD-10-CM | POA: Diagnosis not present

## 2023-12-06 DIAGNOSIS — E119 Type 2 diabetes mellitus without complications: Secondary | ICD-10-CM | POA: Insufficient documentation

## 2023-12-06 DIAGNOSIS — Z87442 Personal history of urinary calculi: Secondary | ICD-10-CM | POA: Diagnosis not present

## 2023-12-06 DIAGNOSIS — Z79899 Other long term (current) drug therapy: Secondary | ICD-10-CM | POA: Insufficient documentation

## 2023-12-06 DIAGNOSIS — I10 Essential (primary) hypertension: Secondary | ICD-10-CM

## 2023-12-06 DIAGNOSIS — K219 Gastro-esophageal reflux disease without esophagitis: Secondary | ICD-10-CM | POA: Diagnosis not present

## 2023-12-06 DIAGNOSIS — Z87440 Personal history of urinary (tract) infections: Secondary | ICD-10-CM | POA: Diagnosis not present

## 2023-12-06 HISTORY — PX: CYSTOSCOPY/URETEROSCOPY/HOLMIUM LASER/STENT PLACEMENT: SHX6546

## 2023-12-06 HISTORY — PX: CYSTOSCOPY W/ RETROGRADES: SHX1426

## 2023-12-06 LAB — GLUCOSE, CAPILLARY: Glucose-Capillary: 132 mg/dL — ABNORMAL HIGH (ref 70–99)

## 2023-12-06 SURGERY — CYSTOSCOPY/URETEROSCOPY/HOLMIUM LASER/STENT PLACEMENT
Anesthesia: General | Laterality: Bilateral

## 2023-12-06 MED ORDER — FENTANYL CITRATE (PF) 100 MCG/2ML IJ SOLN
INTRAMUSCULAR | Status: AC
  Filled 2023-12-06: qty 2

## 2023-12-06 MED ORDER — PROPOFOL 10 MG/ML IV BOLUS
INTRAVENOUS | Status: DC | PRN
Start: 2023-12-06 — End: 2023-12-06
  Administered 2023-12-06: 100 ug/kg/min via INTRAVENOUS
  Administered 2023-12-06: 150 mg via INTRAVENOUS

## 2023-12-06 MED ORDER — OXYCODONE-ACETAMINOPHEN 5-325 MG PO TABS: 1.0000 | ORAL_TABLET | Freq: Four times a day (QID) | ORAL | 0 refills | Status: AC | PRN

## 2023-12-06 MED ORDER — ACETAMINOPHEN 500 MG PO TABS
1000.0000 mg | ORAL_TABLET | Freq: Once | ORAL | Status: AC
  Administered 2023-12-06: 1000 mg via ORAL
  Filled 2023-12-06: qty 2

## 2023-12-06 MED ORDER — ORAL CARE MOUTH RINSE: 15.0000 mL | Freq: Once | OROMUCOSAL | Status: AC

## 2023-12-06 MED ORDER — PROPOFOL 1000 MG/100ML IV EMUL
INTRAVENOUS | Status: AC
  Filled 2023-12-06: qty 100

## 2023-12-06 MED ORDER — LIDOCAINE HCL (PF) 2 % IJ SOLN
INTRAMUSCULAR | Status: DC | PRN
Start: 2023-12-06 — End: 2023-12-06
  Administered 2023-12-06: 80 mg via INTRADERMAL

## 2023-12-06 MED ORDER — MIDAZOLAM HCL 2 MG/2ML IJ SOLN: 0.5000 mg | Freq: Once | INTRAMUSCULAR | Status: DC | PRN

## 2023-12-06 MED ORDER — ONDANSETRON HCL 4 MG/2ML IJ SOLN
INTRAMUSCULAR | Status: AC
  Filled 2023-12-06: qty 2

## 2023-12-06 MED ORDER — FENTANYL CITRATE (PF) 100 MCG/2ML IJ SOLN
INTRAMUSCULAR | Status: DC | PRN
  Administered 2023-12-06: 100 ug via INTRAVENOUS
  Administered 2023-12-06: 25 ug via INTRAVENOUS
  Administered 2023-12-06: 50 ug via INTRAVENOUS

## 2023-12-06 MED ORDER — OXYCODONE HCL 5 MG PO TABS: 5.0000 mg | ORAL_TABLET | Freq: Once | ORAL | Status: DC | PRN

## 2023-12-06 MED ORDER — MIDAZOLAM HCL 2 MG/2ML IJ SOLN
INTRAMUSCULAR | Status: AC
  Filled 2023-12-06: qty 2

## 2023-12-06 MED ORDER — SODIUM CHLORIDE 0.9 % IR SOLN
Status: DC | PRN
  Administered 2023-12-06: 3000 mL via INTRAVESICAL

## 2023-12-06 MED ORDER — DEXAMETHASONE SODIUM PHOSPHATE 10 MG/ML IJ SOLN
INTRAMUSCULAR | Status: DC | PRN
  Administered 2023-12-06: 10 mg via INTRAVENOUS

## 2023-12-06 MED ORDER — SENNOSIDES-DOCUSATE SODIUM 8.6-50 MG PO TABS: 1.0000 | ORAL_TABLET | Freq: Two times a day (BID) | ORAL | 0 refills | Status: AC

## 2023-12-06 MED ORDER — LACTATED RINGERS IV SOLN: INTRAVENOUS | Status: DC

## 2023-12-06 MED ORDER — PHENYLEPHRINE 80 MCG/ML (10ML) SYRINGE FOR IV PUSH (FOR BLOOD PRESSURE SUPPORT)
PREFILLED_SYRINGE | INTRAVENOUS | Status: DC | PRN
  Administered 2023-12-06 (×2): 160 ug via INTRAVENOUS

## 2023-12-06 MED ORDER — MEPERIDINE HCL 100 MG/ML IJ SOLN: 6.2500 mg | INTRAMUSCULAR | Status: DC | PRN

## 2023-12-06 MED ORDER — IOHEXOL 300 MG/ML  SOLN
INTRAMUSCULAR | Status: DC | PRN
  Administered 2023-12-06: 31 mL via URETHRAL

## 2023-12-06 MED ORDER — INSULIN ASPART 100 UNIT/ML IJ SOLN: 0.0000 [IU] | INTRAMUSCULAR | Status: DC | PRN

## 2023-12-06 MED ORDER — CIPROFLOXACIN HCL 500 MG PO TABS: 500.0000 mg | ORAL_TABLET | Freq: Two times a day (BID) | ORAL | 0 refills | Status: AC

## 2023-12-06 MED ORDER — KETOROLAC TROMETHAMINE 10 MG PO TABS: 10.0000 mg | ORAL_TABLET | Freq: Three times a day (TID) | ORAL | 0 refills | Status: AC | PRN

## 2023-12-06 MED ORDER — LACTATED RINGERS IV SOLN: INTRAVENOUS | Status: DC | PRN

## 2023-12-06 MED ORDER — PROPOFOL 10 MG/ML IV BOLUS
INTRAVENOUS | Status: AC
  Filled 2023-12-06: qty 20

## 2023-12-06 MED ORDER — MIDAZOLAM HCL 2 MG/2ML IJ SOLN
INTRAMUSCULAR | Status: DC | PRN
  Administered 2023-12-06: 1 mg via INTRAVENOUS

## 2023-12-06 MED ORDER — HYDROMORPHONE HCL 1 MG/ML IJ SOLN
0.2500 mg | INTRAMUSCULAR | Status: DC | PRN
  Administered 2023-12-06: 0.5 mg via INTRAVENOUS

## 2023-12-06 MED ORDER — DEXAMETHASONE SODIUM PHOSPHATE 10 MG/ML IJ SOLN
INTRAMUSCULAR | Status: AC
  Filled 2023-12-06: qty 1

## 2023-12-06 MED ORDER — PHENYLEPHRINE 80 MCG/ML (10ML) SYRINGE FOR IV PUSH (FOR BLOOD PRESSURE SUPPORT)
PREFILLED_SYRINGE | INTRAVENOUS | Status: AC
  Filled 2023-12-06: qty 10

## 2023-12-06 MED ORDER — ONDANSETRON HCL 4 MG/2ML IJ SOLN
INTRAMUSCULAR | Status: DC | PRN
Start: 2023-12-06 — End: 2023-12-06
  Administered 2023-12-06: 4 mg via INTRAVENOUS

## 2023-12-06 MED ORDER — LIDOCAINE HCL (PF) 2 % IJ SOLN
INTRAMUSCULAR | Status: AC
  Filled 2023-12-06: qty 5

## 2023-12-06 MED ORDER — DEXMEDETOMIDINE HCL IN NACL 80 MCG/20ML IV SOLN
INTRAVENOUS | Status: AC
  Filled 2023-12-06: qty 20

## 2023-12-06 MED ORDER — OXYCODONE HCL 5 MG/5ML PO SOLN: 5.0000 mg | Freq: Once | ORAL | Status: DC | PRN

## 2023-12-06 MED ORDER — CHLORHEXIDINE GLUCONATE 0.12 % MT SOLN
15.0000 mL | Freq: Once | OROMUCOSAL | Status: AC
  Administered 2023-12-06: 15 mL via OROMUCOSAL

## 2023-12-06 MED ORDER — HYDROMORPHONE HCL 1 MG/ML IJ SOLN
INTRAMUSCULAR | Status: AC
  Filled 2023-12-06: qty 1

## 2023-12-06 SURGICAL SUPPLY — 20 items
BAG URO CATCHER STRL LF (MISCELLANEOUS) ×1 IMPLANT
BASKET LASER NITINOL 1.9FR (BASKET) IMPLANT
BASKET ZERO TIP NITINOL 2.4FR (BASKET) IMPLANT
CATH URETL OPEN END 6FR 70 (CATHETERS) ×1 IMPLANT
CLOTH BEACON ORANGE TIMEOUT ST (SAFETY) ×1 IMPLANT
GLOVE SURG LX STRL 7.5 STRW (GLOVE) ×1 IMPLANT
GOWN STRL REUS W/ TWL XL LVL3 (GOWN DISPOSABLE) ×1 IMPLANT
GUIDEWIRE ANG ZIPWIRE 038X150 (WIRE) ×1 IMPLANT
GUIDEWIRE STR DUAL SENSOR (WIRE) ×1 IMPLANT
KIT TURNOVER KIT A (KITS) ×1 IMPLANT
MANIFOLD NEPTUNE II (INSTRUMENTS) ×1 IMPLANT
NS IRRIG 1000ML POUR BTL (IV SOLUTION) IMPLANT
PACK CYSTO (CUSTOM PROCEDURE TRAY) ×1 IMPLANT
SHEATH NAVIGATOR HD 11/13X28 (SHEATH) IMPLANT
SHEATH NAVIGATOR HD 11/13X36 (SHEATH) IMPLANT
STENT POLARIS 5FRX22 (STENTS) IMPLANT
TRACTIP FLEXIVA PULS ID 200XHI (Laser) IMPLANT
TUBE PU 8FR 16IN ENFIT (TUBING) ×1 IMPLANT
TUBING CONNECTING 10 (TUBING) ×1 IMPLANT
TUBING UROLOGY SET (TUBING) ×1 IMPLANT

## 2023-12-06 NOTE — Anesthesia Procedure Notes (Signed)
 Procedure Name: LMA Insertion Date/Time: 12/06/2023 7:44 AM  Performed by: Obadiah Reyes BROCKS, CRNAPre-anesthesia Checklist: Patient identified, Emergency Drugs available, Suction available and Patient being monitored Patient Re-evaluated:Patient Re-evaluated prior to induction Oxygen Delivery Method: Circle System Utilized Preoxygenation: Pre-oxygenation with 100% oxygen Induction Type: IV induction Ventilation: Mask ventilation without difficulty LMA: LMA inserted LMA Size: 4.0 Number of attempts: 1 Airway Equipment and Method: Bite block Placement Confirmation: positive ETCO2 Tube secured with: Tape Dental Injury: Teeth and Oropharynx as per pre-operative assessment

## 2023-12-06 NOTE — Op Note (Signed)
 NAME: Christine, Navarro MEDICAL RECORD NO: [id number] ACCOUNT NO: [id number] DATE OF BIRTH: December 25, 1950 FACILITY: [facility] LOCATION: [facility] PHYSICIAN: Ricardo Likens, MD  Operative Report   DATE OF PROCEDURE: 12/06/2023  SURGEON:  Ricardo Likens, MD  PREOPERATIVE DIAGNOSES: 1.  Bilateral renal stones. 2.  History of recurrent cystitis.  PROCEDURE PERFORMED: 1.  Cystoscopy with bilateral retrograde pyelograms with interpretation. 2.  Bilateral ureteroscopy with laser lithotripsy. 3.  Insertion of bilateral ureteral stents.  ESTIMATED BLOOD LOSS:  Nil.  COMPLICATIONS:  None.  SPECIMENS:  Bilateral renal stones were given to the patient.  FINDINGS: 1.  Bilateral papillary tip calcifications, left greater than right.  Total volume on the left approximately 1 cm, total volume on the right approximately 8 mm. 2.  Successful placement of bilateral ureteral stents, proximal end in the renal pelvis and distal end in the urinary bladder after resolution of all stone fragments.  INDICATIONS:  The patient is a 73 year old lady with a history of CVA and recurrent urolithiasis.  She was found recently to have several episodes of recurrent cystitis.  She empties well and her only real predisposing factor are her stones, which very  well may be colonized at this point.  Options were discussed including the recommended path for bilateral ureteroscopy with the goal of stone-free to reduce her nidus for colonization.  She presents for this today.  Informed consent was obtained and  placed in the medical record.  DESCRIPTION OF PROCEDURE:  The patient being Christine [facility]arro verified and procedure being bilateral ureteroscopic stone manipulation was confirmed.  Procedure timeout was performed.  Antibiotics were administered.  General LMA anesthesia was induced.  The  patient was placed into a low lithotomy position.  A sterile field was created, prepped and draped the patient's vagina, introitus, and  proximal thigh using iodine.  Cystourethroscopy was performed using a 21-French rigid cystoscope with offset lens.   Inspection of urinary bladder revealed no diverticula, calcifications, or papillary lesions.  The left ureteral orifice was cannulated with a 6-French end-hole catheter and a left retrograde pyelogram was obtained.   Left retrograde pyelogram demonstrated single left ureter, single system left kidney.  No filling defects or narrowing noted.    A ZIPwire was then advanced to the level of the upper pole and set side as a safety wire.    Next, a right retrograde pyelogram was obtained.   The right retrograde pyelogram demonstrated a single right ureter, single system right kidney. No filling defects were noted.  A separate Sensor wire was advanced and set aside as a safety wire. An 8-French feeding tube placed in the urinary bladder for  pressure release.  Next, semirigid ureteroscopy was then performed for distal four-fifths of the right ureter alongside a separate sensory working wire.  No mucosal abnormalities were found.  Next, a semirigid ureteroscopy was performed of the distal  four-fifths of the left ureter alongside as a separate sensory working wire.  No mucosal abnormalities were found.  The semirigid ureteroscope was then exchanged for a short-length ureteral access sheath, which was placed to the level of the proximal ureter over the Sensor working wire.  A flexible digital ureteroscopy was performed in the proximal left ureter and  systematic inspection of the left kidney, visualizing all calyces x 3.  There were multifocal papillary tip calcifications noted, mostly in the upper and mid poles respectively.  Total surface area of approximately 1 cm.  About half of these foci were  amenable to simple basketing with  a Zero Tip basket.  The other half did require laser lithotripsy using settings of 0.2 joules and 40 Hz.  The remainder of the stone was approximately 50% dusted and  50% fragmented, with the fragments then being amenable  to simple basketing such that all accessible stone fragments larger than 1 mm had been removed.  The access sheath was removed under continuous vision.  No significant mucosal abnormalities were found.  The access sheath was then placed up the right side over the right Sensor wire to the level of the proximal right ureter, and systematic inspection was performed in the proximal right ureter and kidney using a flexible digital ureteroscope.  There were  two dominant foci of stone on the right side, one small in the lower pole, amenable to simple basketing and another one in the upper pole.  It did require laser lithotripsy, fragmenting it into two smaller pieces that were then retrievable.  Following this, there was complete resolution of all accessible stone fragments on the right side, larger than one-third mm and the access sheath was removed under continuous vision.  No significant mucosal abnormalities were found.  Given the access sheath usage, it was felt that brief interval stenting with tether stents would be most prudent.  Bilateral 5 x 22 Polaris type stents were carefully placed using fluoroscopic guidance.  Good proximal and distal planes were noted.   Tethers were fashioned together, trimmed to length, and tucked per vagina and the procedure was terminated.  The patient tolerated the procedure well with no immediate periprocedural complications.  The patient taken to postanesthesia care unit in stable  condition with plan for discharge home.   [facility] D: 12/06/2023 8:33:38 am T: 12/06/2023 9:41:00 am  JOB: [id number]/ [id number]

## 2023-12-06 NOTE — H&P (Signed)
 Christine Navarro is an 73 y.o. female.    Chief Complaint: Pre-OP BILATERAL Ureteroscopic Stone Manipulation  HPI:   1 - Recurrent Nephrolithiasis -   Pre 2016 SWLx several, URS x several  11/2014 SWL 7mm left prox ureteral stone, CT Also bilat renal stones, non-obstrucitng including Rt lower pole 1cm.  07/2017 - MET RT 4mm distal stone, CT with bilat punctate (too small to measure) as well.  09/2017 - MET 3mm stone.   Recent Surveillance:  02/2021 - CT - stable bilateral NON-obstructing  02/2022- [facility], Renal US  - stable bilateral NON-obstructing, and non-complex renal cysts, mild Rt caliectaiss (stable)  02/2023 - [facility], Renal US  - stable bilateral NON-obstructing, and non-complex renal cysts, mild Rt caliectaiss (stable)   PMH sig for DM2, obesity, benign hyst, open chole, CVA 2020 (plavix , mild L side weakness). Her PCP is Joen Gentry MD.    Today  Christine Navarro  is seen to proceed with BILATERAL ureteroscopy with goal of stone free..Few episodes pan-sensitive psudomonas in last 6mos. Stones likely colonized. No fevers / flank pain. She has been on Cipro  pre-op to help reduce colonization some. Cr 0.8 most recently.  Past Medical History:  Diagnosis Date  . Anxiety   . Arthritis   . Asthma   . Basal cell adenocarcinoma    multiple lesion  . GERD (gastroesophageal reflux disease)   . Heart murmur    as a child  . History of kidney stones   . Hypertension   . PONV (postoperative nausea and vomiting)   . Pre-diabetes   . Sleep apnea    mild per pt-  no CPAP  . Stroke (cerebrum) [facility])     Past Surgical History:  Procedure Laterality Date  . ABDOMINAL HYSTERECTOMY    . APPENDECTOMY    . BREAST BIOPSY Left   . BREAST EXCISIONAL BIOPSY Left   . BREAST SURGERY     x 3  . CHOLECYSTECTOMY     open  . CYSTOSCOPY WITH RETROGRADE PYELOGRAM, URETEROSCOPY AND STENT PLACEMENT Right 12/23/2015   Procedure: CYSTOSCOPY WITH RETROGRADE PYELOGRAM, URETEROSCOPY AND STENT PLACEMENT;   Surgeon: Ricardo Likens, MD;  Location: [facility];  Service: Urology;  Laterality: Right;  . DIAGNOSTIC LAPAROSCOPY    . HOLMIUM LASER APPLICATION Right 12/23/2015   Procedure: HOLMIUM LASER APPLICATION;  Surgeon: Ricardo Likens, MD;  Location: [facility];  Service: Urology;  Laterality: Right;  . loop recoder remove  2019   reaction to nickel  . LOOP RECORDER INSERTION N/A 11/02/2017   Procedure: LOOP RECORDER INSERTION;  Surgeon: Kelsie Agent, MD;  Location: [facility] IN[facility]ASIVE CV [facility];  Service: Cardiovascular;  Laterality: N/A;  . salpingectomy with oophorectomy    . TEE WITHOUT CARDIOVERSION N/A 11/02/2017   Procedure: TRANSESOPHAGEAL ECHOCARDIOGRAM (TEE);  Surgeon: Okey Vina GAILS, MD;  Location: [facility] ENDOSCOPY;  Service: Cardiovascular;  Laterality: N/A;  loop recorder, bubble study    Family History  Problem Relation Age of Onset  . Cancer Mother   . Breast cancer Mother   . Heart disease Father   . Stroke Paternal Aunt    Social History:  reports that she has never smoked. She has never used smokeless tobacco. She reports that she does not drink alcohol and does not use drugs.  Allergies:  Allergies  Allergen Reactions  . Ace Inhibitors Cough  . Benazepril Hcl     Other Reaction(s): Unknown  . Cholecalciferol Other (See Comments)  . Ciprofloxacin  Other (See Comments)    Muscle Cramps  .  Sular [Nisoldipine Er] Other (See Comments)    HEART RACING  . Vitamin D Analogs Nausea And Vomiting  . Welchol [Colesevelam Hcl] Nausea And Vomiting  . Erythromycin Nausea And Vomiting  . Nickel Itching, Dermatitis and Rash    Possible nickel allergy. Loop recorder explanted 11/30/17.  . Sulfa Antibiotics Rash  . Tape Rash    Use paper tape please    Medications Prior to Admission  Medication Sig Dispense Refill  . acetaminophen  (TYLENOL ) 500 MG tablet Take 500 mg by mouth at bedtime.    . calcium  elemental as carbonate (TUMS ULTRA 1000) 400 MG chewable tablet Chew 1,000 mg by mouth at bedtime.     . clopidogrel  (PLAVIX ) 75 MG tablet Take 1 tablet (75 mg total) by mouth daily. 30 tablet 0  . irbesartan (AVAPRO) 300 MG tablet Take 300 mg by mouth every evening.    . lansoprazole (PREVACID) 15 MG capsule Take 15 mg by mouth once a week. 24 hr tablet    . levocetirizine (XYZAL) 5 MG tablet Take 2.5 mg by mouth at bedtime.    . LORazepam  (ATIVAN ) 1 MG tablet Take 0.5 mg by mouth at bedtime. Take additional 0.5 mg of wake up on the middle of the night    . metoprolol tartrate (LOPRESSOR) 25 MG tablet Take 12.5-25 mg by mouth See admin instructions. Take 12.5 mg in the morning and 25 mg at bedtime    . oxybutynin  (DITROPAN -XL) 5 MG 24 hr tablet Take 5 mg by mouth daily.    Christine Navarro PRESCRIPTION MEDICATION Apply 1 application  topically daily as needed (Rash under arm). [facility] Fluticasone/propionate/ketoconazole    . rosuvastatin (CRESTOR) 20 MG tablet Take 20 mg by mouth daily.    . sodium chloride  (BRONCHO SALINE ) inhaler solution Take 1 spray by nebulization at bedtime as needed (Congestion).    . sodium chloride  (OCEAN) 0.65 % nasal spray Place 1 spray into the nose at bedtime as needed for congestion. [place]    . spironolactone (ALDACTONE) 25 MG tablet Take 25 mg by mouth daily as needed (pain).    Christine Navarro trolamine salicylate (ASPERCREME) 10 % cream Apply 1 application topically daily as needed for muscle pain.      Results for orders placed or performed during the hospital encounter of 12/06/23 (from the past 48 hours)  Glucose, capillary     Status: Abnormal   Collection Time: 12/06/23  6:04 AM  Result Value Ref Range   Glucose-Capillary 132 (H) 70 - 99 mg/dL    Comment: Glucose reference range applies only to samples taken after fasting for at least 8 hours.   Comment 1 Notify RN    No results found.  Review of Systems  Constitutional:  Negative for chills and fever.  All other systems reviewed and are negative.   Blood pressure (!) 167/80, pulse 61, temperature 97.9 F (36.6  C), temperature source Oral, resp. rate 17, height 5' 2 (1.575 m), weight 80.3 kg, SpO2 100%. Physical Exam Vitals reviewed.  Constitutional:      Comments: Very mild stigmata of prior CVA  HENT:     Head: Normocephalic.  Eyes:     Pupils: Pupils are equal, round, and reactive to light.  Cardiovascular:     Rate and Rhythm: Normal rate.  Abdominal:     General: Abdomen is flat.     Comments: Stabel truncal obesity, no cVAT  Musculoskeletal:        General: Normal range of motion.  Cervical back: Normal range of motion.  Skin:    General: Skin is warm.  Neurological:     General: No focal deficit present.     Mental Status: She is alert.  Psychiatric:        Mood and Affect: Mood normal.     Assessment/Plan  Proceed as planned with BILATERAL ureteroscopoic stone manipulation. Risks, benefits, alternatives, expected peri-op course discussed previously and reiterated today.Christine Navarro Ricardo KATHEE Alvaro Mickey., MD 12/06/2023, 6:31 AM

## 2023-12-06 NOTE — Transfer of Care (Signed)
 Immediate Anesthesia Transfer of Care Note  Patient: Christine Navarro  Procedure(s) Performed: CYSTOSCOPY/URETEROSCOPY/HOLMIUM LASER/STENT PLACEMENT (Bilateral) CYSTOSCOPY, WITH RETROGRADE PYELOGRAM (Bilateral)  Patient Location: PACU  Anesthesia Type:General  Level of Consciousness: awake, alert , and oriented  Airway & Oxygen Therapy: Patient Spontanous Breathing and Patient connected to nasal cannula oxygen  Post-op Assessment: Report given to RN and Post -op Vital signs reviewed and stable  Post vital signs: Reviewed and stable  Last Vitals:  Vitals Value Taken Time  BP 146/68 12/06/23 08:37  Temp    Pulse 73 12/06/23 08:40  Resp 14 12/06/23 08:40  SpO2 100 % 12/06/23 08:40  Vitals shown include unfiled device data.  Last Pain:  Vitals:   12/06/23 0549  TempSrc: Oral         Complications: No notable events documented.

## 2023-12-06 NOTE — Brief Op Note (Signed)
 12/06/2023  8:28 AM  PATIENT:  Christine Navarro  73 y.o. female  PRE-OPERATIVE DIAGNOSIS:  BILATERAL RENAL STONES  POST-OPERATIVE DIAGNOSIS:  BILATERAL RENAL STONES  PROCEDURE:  Procedure(s): CYSTOSCOPY/URETEROSCOPY/HOLMIUM LASER/STENT PLACEMENT (Bilateral) CYSTOSCOPY, WITH RETROGRADE PYELOGRAM (Bilateral)  SURGEON:  Surgeons and Role:    * Manny, Ricardo KATHEE Raddle., MD - Primary  PHYSICIAN ASSISTANT:   ASSISTANTS: none   ANESTHESIA:   general  EBL:  minimal   BLOOD ADMINISTERED:none  DRAINS: none   LOCAL MEDICATIONS USED:  NONE  SPECIMEN:  Source of Specimen:  bilateral renal stone fragments  DISPOSITION OF SPECIMEN:  given to patient  COUNTS:  YES  TOURNIQUET:  * No tourniquets in log *  DICTATION: .Other Dictation: Dictation Number [id number]  PLAN OF CARE: Discharge to home after PACU  PATIENT DISPOSITION:  PACU - hemodynamically stable.   Delay start of Pharmacological VTE agent (>24hrs) due to surgical blood loss or risk of bleeding: yes

## 2023-12-06 NOTE — Anesthesia Postprocedure Evaluation (Signed)
 Anesthesia Post Note  Patient: Christine Navarro  Procedure(s) Performed: CYSTOSCOPY/URETEROSCOPY/HOLMIUM LASER/STENT PLACEMENT (Bilateral) CYSTOSCOPY, WITH RETROGRADE PYELOGRAM (Bilateral)     Patient location during evaluation: PACU Anesthesia Type: General Level of consciousness: awake and alert, oriented and patient cooperative Pain management: pain level controlled Vital Signs Assessment: post-procedure vital signs reviewed and stable Respiratory status: spontaneous breathing, nonlabored ventilation and respiratory function stable Cardiovascular status: blood pressure returned to baseline and stable Postop Assessment: no apparent nausea or vomiting and able to ambulate Anesthetic complications: no   No notable events documented.  Last Vitals:  Vitals:   12/06/23 0926 12/06/23 0930  BP: (!) 157/70 (!) 146/67  Pulse: (!) 54 (!) 51  Resp: 12 14  Temp: 36.4 C   SpO2: 97% 97%    Last Pain:  Vitals:   12/06/23 0930  TempSrc:   PainSc: 0-No pain                 Antonyo Hinderer,E. Sultan Pargas

## 2023-12-06 NOTE — Discharge Instructions (Addendum)
 1 - You may have urinary urgency (bladder spasms) and bloody urine on / off with stent in place. This is normal.  2 - Remove tethered stents on Friday morning at home by pulling black string and discarding. There are TWO stents on the common string. Office is open Friday if any problems arise.   3 - Call MD or go to ER for fever >102, severe pain / nausea / vomiting not relieved by medications, or acute change in medical status

## 2023-12-07 ENCOUNTER — Encounter (HOSPITAL_COMMUNITY): Payer: Self-pay | Admitting: Urology

## 2023-12-25 ENCOUNTER — Other Ambulatory Visit: Payer: Self-pay | Admitting: Family Medicine

## 2023-12-25 DIAGNOSIS — Z1231 Encounter for screening mammogram for malignant neoplasm of breast: Secondary | ICD-10-CM

## 2023-12-25 DIAGNOSIS — N202 Calculus of kidney with calculus of ureter: Secondary | ICD-10-CM | POA: Diagnosis not present

## 2023-12-25 DIAGNOSIS — N3020 Other chronic cystitis without hematuria: Secondary | ICD-10-CM | POA: Diagnosis not present

## 2023-12-25 DIAGNOSIS — N281 Cyst of kidney, acquired: Secondary | ICD-10-CM | POA: Diagnosis not present

## 2024-01-19 DIAGNOSIS — Z23 Encounter for immunization: Secondary | ICD-10-CM | POA: Diagnosis not present

## 2024-01-24 ENCOUNTER — Ambulatory Visit
Admission: RE | Admit: 2024-01-24 | Discharge: 2024-01-24 | Disposition: A | Discharge status: Home / Self Care | Source: Ambulatory Visit | Attending: Family Medicine | Admitting: Family Medicine

## 2024-01-24 DIAGNOSIS — Z1231 Encounter for screening mammogram for malignant neoplasm of breast: Secondary | ICD-10-CM

## 2024-01-25 DIAGNOSIS — J309 Allergic rhinitis, unspecified: Secondary | ICD-10-CM | POA: Diagnosis not present

## 2024-01-25 DIAGNOSIS — G4733 Obstructive sleep apnea (adult) (pediatric): Secondary | ICD-10-CM | POA: Diagnosis not present

## 2024-01-25 DIAGNOSIS — I10 Essential (primary) hypertension: Secondary | ICD-10-CM | POA: Diagnosis not present

## 2024-02-01 DIAGNOSIS — H40013 Open angle with borderline findings, low risk, bilateral: Secondary | ICD-10-CM | POA: Diagnosis not present

## 2024-02-12 DIAGNOSIS — N200 Calculus of kidney: Secondary | ICD-10-CM | POA: Diagnosis not present

## 2024-02-12 DIAGNOSIS — N281 Cyst of kidney, acquired: Secondary | ICD-10-CM | POA: Diagnosis not present
